# Patient Record
Sex: Female | Born: 1935 | Race: White | Hispanic: No | Marital: Married | Smoking: Former smoker
Health system: Southern US, Community
[De-identification: ages and names within clinical notes are randomized; demographics above are authoritative.]

## PROBLEM LIST (undated history)

## (undated) DIAGNOSIS — H409 Unspecified glaucoma: Secondary | ICD-10-CM

## (undated) DIAGNOSIS — R0600 Dyspnea, unspecified: Secondary | ICD-10-CM

## (undated) DIAGNOSIS — Z7901 Long term (current) use of anticoagulants: Secondary | ICD-10-CM

## (undated) DIAGNOSIS — M353 Polymyalgia rheumatica: Secondary | ICD-10-CM

## (undated) DIAGNOSIS — R609 Edema, unspecified: Secondary | ICD-10-CM

## (undated) DIAGNOSIS — J189 Pneumonia, unspecified organism: Secondary | ICD-10-CM

## (undated) DIAGNOSIS — N183 Chronic kidney disease, stage 3 unspecified: Secondary | ICD-10-CM

## (undated) DIAGNOSIS — I779 Disorder of arteries and arterioles, unspecified: Secondary | ICD-10-CM

## (undated) DIAGNOSIS — R21 Rash and other nonspecific skin eruption: Secondary | ICD-10-CM

## (undated) DIAGNOSIS — G8929 Other chronic pain: Secondary | ICD-10-CM

## (undated) DIAGNOSIS — J449 Chronic obstructive pulmonary disease, unspecified: Secondary | ICD-10-CM

## (undated) DIAGNOSIS — R269 Unspecified abnormalities of gait and mobility: Secondary | ICD-10-CM

## (undated) DIAGNOSIS — E46 Unspecified protein-calorie malnutrition: Secondary | ICD-10-CM

## (undated) DIAGNOSIS — E7800 Pure hypercholesterolemia, unspecified: Secondary | ICD-10-CM

## (undated) DIAGNOSIS — I509 Heart failure, unspecified: Secondary | ICD-10-CM

## (undated) DIAGNOSIS — M549 Dorsalgia, unspecified: Secondary | ICD-10-CM

## (undated) DIAGNOSIS — K921 Melena: Secondary | ICD-10-CM

## (undated) DIAGNOSIS — E042 Nontoxic multinodular goiter: Secondary | ICD-10-CM

## (undated) DIAGNOSIS — K5790 Diverticulosis of intestine, part unspecified, without perforation or abscess without bleeding: Secondary | ICD-10-CM

## (undated) DIAGNOSIS — R531 Weakness: Secondary | ICD-10-CM

## (undated) DIAGNOSIS — I4892 Unspecified atrial flutter: Secondary | ICD-10-CM

## (undated) DIAGNOSIS — I499 Cardiac arrhythmia, unspecified: Secondary | ICD-10-CM

## (undated) HISTORY — PX: OTHER SURGICAL HISTORY: SHX169

## (undated) HISTORY — DX: Dorsalgia, unspecified: M54.9

## (undated) HISTORY — DX: Edema, unspecified: R60.9

## (undated) HISTORY — DX: Disorder of arteries and arterioles, unspecified: I77.9

## (undated) HISTORY — DX: Rash and other nonspecific skin eruption: R21

## (undated) HISTORY — PX: LAMINECTOMY: SHX219

## (undated) HISTORY — DX: Pneumonia, unspecified organism: J18.9

## (undated) HISTORY — DX: Diverticulosis of intestine, part unspecified, without perforation or abscess without bleeding: K57.90

## (undated) HISTORY — DX: Unspecified glaucoma: H40.9

## (undated) HISTORY — DX: Long term (current) use of anticoagulants: Z79.01

## (undated) HISTORY — PX: SHOULDER SURGERY: SHX246

## (undated) HISTORY — DX: Heart failure, unspecified: I50.9

## (undated) HISTORY — DX: Weakness: R53.1

## (undated) HISTORY — PX: GALLBLADDER SURGERY: SHX652

## (undated) HISTORY — DX: Nontoxic multinodular goiter: E04.2

## (undated) HISTORY — DX: Unspecified abnormalities of gait and mobility: R26.9

## (undated) HISTORY — DX: Chronic kidney disease, stage 3 (moderate): N18.3

## (undated) HISTORY — DX: Other chronic pain: G89.29

## (undated) HISTORY — DX: Chronic kidney disease, stage 3 unspecified: N18.30

## (undated) HISTORY — DX: Unspecified protein-calorie malnutrition: E46

## (undated) HISTORY — DX: Polymyalgia rheumatica: M35.3

---

## 1999-04-18 ENCOUNTER — Encounter: Admission: RE | Admit: 1999-04-18 | Discharge: 1999-04-18 | Payer: Self-pay | Admitting: Internal Medicine

## 1999-04-18 ENCOUNTER — Encounter: Payer: Self-pay | Admitting: Internal Medicine

## 1999-07-03 ENCOUNTER — Other Ambulatory Visit: Admission: RE | Admit: 1999-07-03 | Discharge: 1999-07-03 | Payer: Self-pay | Admitting: Orthopedic Surgery

## 2001-02-03 ENCOUNTER — Encounter: Admission: RE | Admit: 2001-02-03 | Discharge: 2001-02-03 | Payer: Self-pay | Admitting: Internal Medicine

## 2001-02-03 ENCOUNTER — Encounter: Payer: Self-pay | Admitting: Internal Medicine

## 2001-05-11 ENCOUNTER — Ambulatory Visit (HOSPITAL_COMMUNITY): Admission: RE | Admit: 2001-05-11 | Discharge: 2001-05-11 | Payer: Self-pay | Admitting: Gastroenterology

## 2001-07-15 ENCOUNTER — Encounter: Payer: Self-pay | Admitting: Internal Medicine

## 2001-07-15 ENCOUNTER — Encounter: Admission: RE | Admit: 2001-07-15 | Discharge: 2001-07-15 | Payer: Self-pay | Admitting: Internal Medicine

## 2002-03-15 ENCOUNTER — Encounter: Admission: RE | Admit: 2002-03-15 | Discharge: 2002-03-15 | Payer: Self-pay | Admitting: Internal Medicine

## 2002-03-15 ENCOUNTER — Encounter: Payer: Self-pay | Admitting: Internal Medicine

## 2005-02-03 ENCOUNTER — Ambulatory Visit (HOSPITAL_COMMUNITY): Admission: RE | Admit: 2005-02-03 | Discharge: 2005-02-03 | Payer: Self-pay | Admitting: Internal Medicine

## 2005-09-22 ENCOUNTER — Encounter: Admission: RE | Admit: 2005-09-22 | Discharge: 2005-09-22 | Payer: Self-pay | Admitting: Gastroenterology

## 2005-10-07 ENCOUNTER — Encounter: Admission: RE | Admit: 2005-10-07 | Discharge: 2005-10-07 | Payer: Self-pay | Admitting: Gastroenterology

## 2005-10-14 ENCOUNTER — Ambulatory Visit (HOSPITAL_COMMUNITY): Admission: RE | Admit: 2005-10-14 | Discharge: 2005-10-14 | Payer: Self-pay | Admitting: Gastroenterology

## 2005-10-22 ENCOUNTER — Ambulatory Visit (HOSPITAL_COMMUNITY): Admission: RE | Admit: 2005-10-22 | Discharge: 2005-10-23 | Payer: Self-pay | Admitting: General Surgery

## 2005-10-22 ENCOUNTER — Encounter (INDEPENDENT_AMBULATORY_CARE_PROVIDER_SITE_OTHER): Payer: Self-pay | Admitting: Specialist

## 2005-10-25 ENCOUNTER — Inpatient Hospital Stay (HOSPITAL_COMMUNITY): Admission: EM | Admit: 2005-10-25 | Discharge: 2005-10-29 | Payer: Self-pay | Admitting: Emergency Medicine

## 2005-10-25 ENCOUNTER — Encounter: Payer: Self-pay | Admitting: Gastroenterology

## 2005-12-04 ENCOUNTER — Ambulatory Visit (HOSPITAL_COMMUNITY): Admission: RE | Admit: 2005-12-04 | Discharge: 2005-12-04 | Payer: Self-pay | Admitting: Gastroenterology

## 2005-12-04 ENCOUNTER — Encounter: Payer: Self-pay | Admitting: Gastroenterology

## 2007-03-17 ENCOUNTER — Ambulatory Visit: Payer: Self-pay | Admitting: Gastroenterology

## 2007-03-17 LAB — CONVERTED CEMR LAB
ALT: 11 units/L (ref 0–35)
Albumin: 3.9 g/dL (ref 3.5–5.2)
Alkaline Phosphatase: 43 units/L (ref 39–117)
Amylase: 213 units/L — ABNORMAL HIGH (ref 27–131)

## 2007-03-29 ENCOUNTER — Ambulatory Visit (HOSPITAL_COMMUNITY): Admission: RE | Admit: 2007-03-29 | Discharge: 2007-03-29 | Payer: Self-pay | Admitting: Gastroenterology

## 2007-03-29 ENCOUNTER — Encounter: Payer: Self-pay | Admitting: Gastroenterology

## 2007-03-29 DIAGNOSIS — K209 Esophagitis, unspecified without bleeding: Secondary | ICD-10-CM | POA: Insufficient documentation

## 2007-04-05 ENCOUNTER — Ambulatory Visit: Payer: Self-pay | Admitting: Gastroenterology

## 2007-04-06 DIAGNOSIS — M129 Arthropathy, unspecified: Secondary | ICD-10-CM

## 2007-04-06 DIAGNOSIS — R1011 Right upper quadrant pain: Secondary | ICD-10-CM

## 2007-04-06 DIAGNOSIS — R1013 Epigastric pain: Secondary | ICD-10-CM

## 2007-09-24 ENCOUNTER — Ambulatory Visit (HOSPITAL_BASED_OUTPATIENT_CLINIC_OR_DEPARTMENT_OTHER): Admission: RE | Admit: 2007-09-24 | Discharge: 2007-09-24 | Payer: Self-pay | Admitting: Orthopedic Surgery

## 2007-11-18 ENCOUNTER — Ambulatory Visit: Payer: Self-pay | Admitting: Gastroenterology

## 2007-11-18 LAB — CONVERTED CEMR LAB
ALT: 15 units/L (ref 0–35)
AST: 18 units/L (ref 0–37)
Alkaline Phosphatase: 50 units/L (ref 39–117)
Basophils Absolute: 0.1 10*3/uL (ref 0.0–0.1)
Basophils Relative: 1 % (ref 0.0–3.0)
Eosinophils Absolute: 0.2 10*3/uL (ref 0.0–0.7)
Eosinophils Relative: 2.1 % (ref 0.0–5.0)
HCT: 35 % — ABNORMAL LOW (ref 36.0–46.0)
Lipase: 40 units/L (ref 11.0–59.0)
Lymphocytes Relative: 20 % (ref 12.0–46.0)
MCHC: 34.9 g/dL (ref 30.0–36.0)
Neutro Abs: 5.2 10*3/uL (ref 1.4–7.7)
Neutrophils Relative %: 68.7 % (ref 43.0–77.0)
RDW: 13 % (ref 11.5–14.6)
Total Bilirubin: 0.8 mg/dL (ref 0.3–1.2)
Total Protein: 6.5 g/dL (ref 6.0–8.3)
WBC: 7.6 10*3/uL (ref 4.5–10.5)

## 2007-11-19 ENCOUNTER — Ambulatory Visit: Payer: Self-pay | Admitting: Gastroenterology

## 2007-11-19 LAB — CONVERTED CEMR LAB
Amylase: 183 units/L — ABNORMAL HIGH (ref 27–131)
Lipase: 40 units/L (ref 11.0–59.0)

## 2008-01-11 ENCOUNTER — Encounter: Admission: RE | Admit: 2008-01-11 | Discharge: 2008-01-11 | Payer: Self-pay | Admitting: Internal Medicine

## 2009-02-19 ENCOUNTER — Encounter: Admission: RE | Admit: 2009-02-19 | Discharge: 2009-02-19 | Payer: Self-pay | Admitting: Internal Medicine

## 2010-04-10 ENCOUNTER — Ambulatory Visit (HOSPITAL_COMMUNITY)
Admission: RE | Admit: 2010-04-10 | Discharge: 2010-04-10 | Payer: Medicare Other | Source: Home / Self Care | Attending: Internal Medicine | Admitting: Internal Medicine

## 2010-04-10 DIAGNOSIS — I739 Peripheral vascular disease, unspecified: Secondary | ICD-10-CM

## 2010-04-28 ENCOUNTER — Encounter: Payer: Self-pay | Admitting: Internal Medicine

## 2010-05-03 ENCOUNTER — Ambulatory Visit (HOSPITAL_COMMUNITY)
Admission: RE | Admit: 2010-05-03 | Discharge: 2010-05-03 | Payer: Self-pay | Source: Home / Self Care | Attending: Internal Medicine | Admitting: Internal Medicine

## 2010-05-07 ENCOUNTER — Other Ambulatory Visit: Payer: Self-pay | Admitting: Internal Medicine

## 2010-05-07 DIAGNOSIS — I70201 Unspecified atherosclerosis of native arteries of extremities, right leg: Secondary | ICD-10-CM

## 2010-05-08 ENCOUNTER — Other Ambulatory Visit (HOSPITAL_COMMUNITY): Payer: Self-pay | Admitting: Interventional Radiology

## 2010-05-08 ENCOUNTER — Encounter: Payer: Self-pay | Admitting: Internal Medicine

## 2010-05-08 ENCOUNTER — Ambulatory Visit
Admission: RE | Admit: 2010-05-08 | Discharge: 2010-05-08 | Disposition: A | Discharge status: Home / Self Care | Payer: BLUE CROSS/BLUE SHIELD | Source: Ambulatory Visit | Attending: Internal Medicine | Admitting: Internal Medicine

## 2010-05-08 DIAGNOSIS — I70201 Unspecified atherosclerosis of native arteries of extremities, right leg: Secondary | ICD-10-CM

## 2010-05-08 DIAGNOSIS — R52 Pain, unspecified: Secondary | ICD-10-CM

## 2010-05-21 ENCOUNTER — Other Ambulatory Visit: Payer: Self-pay

## 2010-05-24 ENCOUNTER — Other Ambulatory Visit (HOSPITAL_COMMUNITY): Payer: Self-pay | Admitting: Interventional Radiology

## 2010-05-24 ENCOUNTER — Ambulatory Visit (HOSPITAL_COMMUNITY)
Admission: RE | Admit: 2010-05-24 | Discharge: 2010-05-24 | Disposition: A | Discharge status: Home / Self Care | Payer: Medicare Other | Source: Ambulatory Visit | Attending: Interventional Radiology | Admitting: Interventional Radiology

## 2010-05-24 DIAGNOSIS — L719 Rosacea, unspecified: Secondary | ICD-10-CM | POA: Insufficient documentation

## 2010-05-24 DIAGNOSIS — Z01812 Encounter for preprocedural laboratory examination: Secondary | ICD-10-CM | POA: Insufficient documentation

## 2010-05-24 DIAGNOSIS — I70219 Atherosclerosis of native arteries of extremities with intermittent claudication, unspecified extremity: Secondary | ICD-10-CM | POA: Insufficient documentation

## 2010-05-24 DIAGNOSIS — R52 Pain, unspecified: Secondary | ICD-10-CM

## 2010-05-24 DIAGNOSIS — M353 Polymyalgia rheumatica: Secondary | ICD-10-CM | POA: Insufficient documentation

## 2010-05-24 DIAGNOSIS — Z7982 Long term (current) use of aspirin: Secondary | ICD-10-CM | POA: Insufficient documentation

## 2010-05-24 LAB — CBC
MCH: 31.4 pg (ref 26.0–34.0)
MCHC: 33.4 g/dL (ref 30.0–36.0)
MCV: 93.8 fL (ref 78.0–100.0)
RBC: 4.37 MIL/uL (ref 3.87–5.11)
WBC: 12.1 10*3/uL — ABNORMAL HIGH (ref 4.0–10.5)

## 2010-05-24 LAB — POCT I-STAT, CHEM 8
Chloride: 107 mEq/L (ref 96–112)
Creatinine, Ser: 1.6 mg/dL — ABNORMAL HIGH (ref 0.4–1.2)
Glucose, Bld: 80 mg/dL (ref 70–99)
HCT: 43 % (ref 36.0–46.0)
Potassium: 4 mEq/L (ref 3.5–5.1)

## 2010-05-24 LAB — PROTIME-INR: INR: 0.85 (ref 0.00–1.49)

## 2010-05-28 ENCOUNTER — Other Ambulatory Visit: Payer: Self-pay | Admitting: Internal Medicine

## 2010-05-28 ENCOUNTER — Other Ambulatory Visit: Payer: Self-pay | Admitting: Interventional Radiology

## 2010-05-28 DIAGNOSIS — I70209 Unspecified atherosclerosis of native arteries of extremities, unspecified extremity: Secondary | ICD-10-CM

## 2010-07-02 ENCOUNTER — Other Ambulatory Visit: Payer: Self-pay | Admitting: Internal Medicine

## 2010-07-02 ENCOUNTER — Ambulatory Visit
Admission: RE | Admit: 2010-07-02 | Discharge: 2010-07-02 | Disposition: A | Discharge status: Home / Self Care | Payer: Medicare Other | Source: Ambulatory Visit | Attending: Internal Medicine | Admitting: Internal Medicine

## 2010-07-02 ENCOUNTER — Ambulatory Visit
Admission: RE | Admit: 2010-07-02 | Discharge: 2010-07-02 | Disposition: A | Discharge status: Home / Self Care | Payer: BLUE CROSS/BLUE SHIELD | Source: Ambulatory Visit | Attending: Interventional Radiology | Admitting: Interventional Radiology

## 2010-07-02 DIAGNOSIS — I70209 Unspecified atherosclerosis of native arteries of extremities, unspecified extremity: Secondary | ICD-10-CM

## 2010-08-20 NOTE — Letter (Signed)
March 17, 2007    Erskine Speed, M.D.  [address], [address]  [place], [place] [address]   RE:  KIONI, STAHL  MRN:  [id number]  /  DOB:  1928/02/27   Dear Dr. Ch[place] Si:   Upon your kind referral, I had the pleasure of evaluating your patient  and I am pleased to offer my findings.  I saw Holley Kocurek in the office  today.  Enclosed is a copy of my progress note that details my findings  and recommendations.   Thank you for the opportunity to participate in your patient's care.    Sincerely,      Barbette Hair. Arlyce Dice, MD,FACG  Electronically Signed    RDK/MedQ  DD: 03/17/2007  DT: 03/17/2007  Job #: [id number]

## 2010-08-20 NOTE — Assessment & Plan Note (Signed)
[facility]                         GASTROENTEROLOGY OFFICE NOTE   KENNIS, BUELL                       MRN:          [id number]  DATE:03/17/2007                            DOB:          Jul 20, 1927    REASON FOR CONSULTATION:  Abdominal pain.   Ms. Femia is a pleasant 77 year old white female, mother-in-law of Dr.  Wadie Lessen, complaining of abdominal pain.  Over the last six months,  she has been suffering from severe intermittent right upper quadrant and  midepigastric pain.  The pain is without radiation.  It may last up to  30 minutes at a time, and it is described as severe pain.  It is  unrelated to meals or exertion.  She had similar pain prior to her  cholecystectomy in 2007.  No gallstones were seen.  She had a bile duct  leak postoperatively that was treated with a biliary stent.  She does  take Aleve twice daily for chronic low back pain.  She is also taking  doxycycline three times a day for Rosacea.  She denies pyrosis, melena,  or hematochezia.  She has episodes of pain every few weeks, the last  approximately two weeks ago.  Liver tests on February 18, 2007 were  entirely unremarkable.  She apparently did have elevations of her  amylase in the past prior to her cholecystectomy.   PAST MEDICAL HISTORY:  Pertinent for a laminectomy and cholecystectomy.  She has degenerative arthritis.   FAMILY HISTORY:  Noncontributory.   MEDICATIONS:  1. Aleve twice daily.  2. Doxycycline 100 mg 3 times a day.  3. Amitriptyline 10 mg nightly.  4. Boniva.   She is allergic to CODEINE.   She does not smoke.  She drinks rarely.  She is married and retired.   REVIEW OF SYSTEMS:  Pertinent for low back pain, which is chronic.   PHYSICAL EXAMINATION:  She is a healthy-appearing female who has  questionable faint scleral icterus and sublingual icterus.  Pulse 58, blood pressure 98/60.  Weight 113.  HEENT: EOMI.  PERRLA.  Sclerae are  anicteric.  Conjunctivae are pink.  NECK:  Supple without thyromegaly, adenopathy or carotid bruits.  CHEST:  Clear to auscultation and percussion without adventitious  sounds.  CARDIAC:  Regular rhythm; normal S1 S2.  There are no murmurs, gallops  or rubs.  ABDOMEN:  She has very mild tenderness to palpation in the upper and mid  epigastrium and right upper quadrant.  There are no abdominal masses or  organomegaly.  EXTREMITIES:  Full range of motion.  No cyanosis, clubbing or edema.  RECTAL:  Deferred.   IMPRESSION:  Intermittent severe upper abdominal pain, reminiscent of  pain prior to cholecystectomy.  Pain could represent ampullary spasm,  stenosis, or possibly retained bile duct stone.  With her chronic  nonsteroidal use, I am concerned that the pain is related to this.  Active pepcid disease related to nonsteroidals is also a consideration.  If she in fact has hyperbilirubinemia, this would likely point to a  biliary source for her pain.   RECOMMENDATION:  1. Hold  Aleve.  At the patient's request, I will place her on Celebrex      20 mg daily along with Nexium 40 mg daily.  2. Upper endoscopy.  3. Repeat LFTs.  4. If the above are unrevealing, then I will attempt to get liver      tests and pancreatic enzymes acutely during an episode of pain.  5. Repeat abdominal ultrasound if the above tests are not diagnostic.     Barbette Hair. Arlyce Dice, MD,FACG  Electronically Signed    RDK/MedQ  DD: 03/17/2007  DT: 03/17/2007  Job #: [id number]   cc:   Erskine Speed, M.D.

## 2010-08-20 NOTE — Op Note (Signed)
NAMEROTUNDA, WORDEN                ACCOUNT NO.:  [id number]   MEDICAL RECORD NO.:  [id number]          PATIENT TYPE:  AMB   LOCATION:  DSC                          FACILITY:  [facility]   PHYSICIAN:  Katy Fitch. Sypher, M.D. DATE OF BIRTH:  12/28/1927   DATE OF PROCEDURE:  09/24/2007  DATE OF DISCHARGE:                               OPERATIVE REPORT   PREOPERATIVE DIAGNOSES:  1. Chronic right ring finger stenosing tenosynovitis.  2. Chronic right ring finger proximal interphalangeal flexion      contracture to 30 degrees.  3. Chronic left ring finger stenosing tenosynovitis at A1 pulley.   POSTOPERATIVE DIAGNOSES:  1. Chronic right ring finger stenosing tenosynovitis.  2. Chronic right ring finger proximal interphalangeal flexion      contracture to 30 degrees.  3. Chronic left ring finger stenosing tenosynovitis at A1 pulley.  4. Identification of fibrillar degenerative rupture of superficialis      tendons of ring fingers bilaterally.   OPERATIONS:  1. Release of right ring finger A1 pulley with tenosynovectomy and      debridement of necrotic tendon from superficialis tendon slips.  2. Injection of Depo-Medrol 40 mg/mL and 2% lidocaine into the      proximal interphalangeal  joint capsule and collateral ligaments in      an effort to relieve flexion contracture.  3. Release of left ring finger A1 pulley with debridement of fibrillar      degenerative tendon.   OPERATING SURGEON:  Katy Fitch. Sypher, MD   ASSISTANT:  Marveen Reeks. Dasnoit, PA-C.   ANESTHESIA:  General sedation and 2% lidocaine metacarpal head-level  block of right ring finger and flexor sheath and left ring finger and  flexor sheath.   SUPERVISING ANESTHESIOLOGIST:  Quita Skye. Krista Blue, MD   INDICATIONS:  Satara Virella is a 77 year old woman who is referred  through the courtesy of her husband Dr. Laney Potash and her primary  care physician, Dr. Nila Nephew for evaluation and management of  bilateral chronic trigger  fingers.   She has had prior injection on the left by Dr. [place] Si and injection by  myself on the right in the remote past.   She has had chronic stenosing tenosynovitis to the point of developing a  significant flexion contracture of her right ring finger that was  continuing to lock.   We recommended proceeding with exploration and debridement of her  tendons at this time as well as release of the A1 pulleys.   PROCEDURE:  Samara Stankowski was brought to the operating room and placed in  supine position upon the operating table.   Following an anesthesia consult by Dr. Krista Blue, a general sedation and  local anesthesia strategy was advised and accepted by Ms. Marca Ancona.   We brought her to room #1 of the [facility], placed in  supine position upon the operating table and under Dr. Robina Ade strict  supervision sedation provided.  The right and left arms were prepped  with Betadine soap solution and sterilely draped.  A pneumatic  tourniquet was applied to the proximal forearm on the  right and to the  proximal brachium on the left.   Following exsanguination of the right hand by direct compression, the  arterial tourniquet was inflated to 250 mmHg.  A 2% lidocaine was  infiltrated in the path of the tendon incision.  When anesthesia  satisfactory, we performed a short oblique incision directly over the A1  pulley.  There was marked edema noted.  The pulley was identified.  The  neurovascular structures were retracted and the pulley incised with  scalpel and scissors.  Immediately, we identified a 30-40% superficialis  tendon degenerative tear with fragmentation and fibrillation of the  tendon substance.   The tendons were delivered with blunt retractors and a careful tendon  debridement accomplished with a micro-rongeur and scissors.  After the  tendons were smoothed to a clean margin, full range of motion and  flexion was noted.  The flexion contracture remained 25 degrees  after  release of the A1 pulley and debridement of the tendons.   The profundus tendon was delivered and found to be normal.   The PIP was then infiltrated through a dorsal approach directly into the  joint capsule with approximately 0.5 mL of a 50/50 mixture of Depo-  Medrol 40 mg/mL and 2% lidocaine without epinephrine.  A small amount  was placed deep to both collateral ligaments.  This strategy was  employed to try to help soften the fibrosis of the capsule and allow  recovery of full motion at the PIP joint.   This will take many weeks.   The wound was then repaired with mattress suture of 5-0 nylon.  A  compressive dressing was applied with Xeroflo sterile gauze and an Ace  wrap.   The tourniquet was released with immediate capillary refill of all  fingers.   Attention was then directed to the left hand.  The left hand was  exsanguinated with an Esmarch bandage.  An arterial tourniquet on  proximal brachium inflated to 250 mmHg.  A short oblique incision was  fashioned directly over the left ring finger A1 pulley.  Edema was  noted.  The pulley was split with scalpel and scissors and the tendons  were delivered.  Once again, a 30% rupture of the superficialis tendon  was noted.  This was debrided to a smooth margin with a micro-rongeur  and scissors.   Thereafter, full range of motion of the ring finger interphalangeal  joints was recovered.   The wound was then repaired with mattress suture of 5-0 nylon followed  by dressing with Xeroflo sterile gauze and Ace wrap.   For aftercare, Ms. Paczkowski was advised to remove her dressings after  approximately 3 days and use Band-Aid dressings.  Her husband would  monitor the wounds.  We anticipated suture removal in 7-10 days postop.      Katy Fitch Sypher, M.D.  Electronically Signed     RVS/MEDQ  D:  09/24/2007  T:  09/24/2007  Job:  [id number]   cc:   Erskine Speed, M.D.

## 2010-08-20 NOTE — Letter (Signed)
March 17, 2007    Luanna Salk   RE:  JESUSA, STENERSON  MRN:  [id number]  /  DOB:  07-21-1927   Dear Alvis Lemmings:   It is my pleasure to have treated you recently as a new patient in my  office.  I appreciate your confidence and the opportunity to participate  in your care.   Since I do have a busy inpatient endoscopy schedule and office schedule,  my office hours vary weekly.  I am, however, available for emergency  calls every day through my office.  If I cannot promptly meet an urgent  office appointment, another one of our gastroenterologists will be able  to assist you.   My well-trained staff are prepared to help you at all times.  For  emergencies after office hours, a physician from our gastroenterology  section is always available through my 24-hour answering service.   While you are under my care, I encourage discussion of your questions  and concerns, and I will be happy to return your calls as soon as I am  available.   Once again, I welcome you as a new patient and I look forward to a happy  and healthy relationship.    Sincerely,      Barbette Hair. Arlyce Dice, MD,FACG  Electronically Signed   RDK/MedQ  DD: 03/17/2007  DT: 03/17/2007  Job #: [id number]

## 2010-08-23 NOTE — Op Note (Signed)
[name], [name]                ACCOUNT NO.:  [id number]   MEDICAL RECORD NO.:  [id number]          PATIENT TYPE:  OIB   LOCATION:  0098                         [facility]ACILITY[facility]  [facility]   PHYSICIAN:  Gita Kudo, M.D. DATE OF BIRTH:  04/04/1928   DATE OF PROCEDURE:  10/22/2005  DATE OF DISCHARGE:                                 OPERATIVE REPORT   OPERATIVE PROCEDURE:  Laparoscopic cholecystectomy with intraoperative  cholangiogram.   SURGEON:  Gita Kudo, M.D.   ASSISTANT:  Anselm Pancoast. Zachery Dakins, M.D.   ANESTHESIA:  General endotracheal.   PREOPERATIVE DIAGNOSES:  1.  Cholecystitis.  2.  Biliary sludge.  3.  Probably passed common duct stone.   POSTOP DIAGNOSIS:  1.  Cholecystitis.  2.  Biliary sludge.  3.  Probably passed common duct stone.  4.  Grossly normal on intraoperative cholangiogram.   CLINICAL SUMMARY:  A 77 year old female in excellent general health.  She  had severe bouts of right upper quadrant abdominal pain.  No real fatty food  intolerance, history of gallbladder disease.  Liver function studies were  normal, but her amylase and lipase were elevated.  Ultrasound was abnormal  with slush.  The pancreatic functions came down, but never quite to normal.  Repeated this morning; and not available.   OPERATIVE FINDINGS:  The gallbladder was thin-walled and not acutely  inflamed.  The cystic duct was normal as was the artery in both size and  anatomy.  The common duct was a little dilated on the cholangiogram; and  there was questionable defect, that was tiny at the distal end.  However,  there was no obstruction; and the dye went readily into the duodenum.   OPERATIVE PROCEDURE:  Under satisfactory general endotracheal anesthesia,  having received 1 gram Ancef preop, the patient was positioned, prepped, and  draped in standard fashion.  A total of 25 mL of 0.5% Marcaine with  epinephrine was infiltrated at the skin incision sites for postop analgesia.  Midline incision made below the umbilicus, carried into the peritoneum  through the midline.  Controlled with a figure-of-eight #0 Vicryl suture and  operating Hassan port inserted, secured.  Good CO2 pneumoperitoneum  established and camera placed.  With good visualization two #5 ports were  placed laterally, and a second #10 port medially.  Operating through the  medial port, we carefully dissected the cystic duct and artery.  When the  duct was circumferentially dissected and securely identified, it was  controlled with a single clip near the gallbladder and an incision made.  A  percutaneous catheter placed and a good cholangiogram obtained with the  findings mentioned above.  Catheter withdrawn and the duct controlled with  multiple clips distally and divided.  Cystic artery circumferentially  dissected and controlled with multiple clips and divided.  The gallbladder  then removed from below upward with cautery for hemostasis and dissection.  The liver bed looked good, was lavaged with saline, cauterized, and then  suctioned dry.   Camera moved to the upper port and a large grasper placed through the  umbilical port; and  used to extract the gallbladder intact, without spillage  or problem.  Operative site again checked, lavaged and suctioned.  CO2 and  ports released.  Midline closed with a previous figure-of-eight and a second  #0 Vicryl suture.  The medial upper incision, fascia approximated with 1-0  Vicryl suture.  Then the skin incisions closed with 4-0 Vicryl and Steri-  Strips.  Sterile absorbent dressings were applied;  and the patient went to  the recovery room from the operating room in good condition.  There were no  complications.  It is anticipated that we will recheck her liver function  studies in the future; and considered ERCP only if she becomes symptomatic.           ______________________________  Gita Kudo, M.D.     MRL/MEDQ  D:  10/22/2005  T:   10/22/2005  Job:  [id number]   cc:   Erskine Speed, M.D.  Fax: [phone]   Danise Edge, M.D.   Bernette Redbird, M.D.  Fax: [phone]   Llana Aliment. Malon Kindle., M.D.  Fax: [phone]

## 2010-08-23 NOTE — Discharge Summary (Signed)
[name], [name]                ACCOUNT NO.:  [id number]   MEDICAL RECORD NO.:  [id number]          PATIENT TYPE:  INP   LOCATION:  1621                         FACILITY:  [facility]   PHYSICIAN:  Gita Kudo, M.D. DATE OF BIRTH:  09-Nov-1927   DATE OF ADMISSION:  10/25/2005  DATE OF DISCHARGE:  10/29/2005                                 DISCHARGE SUMMARY   CHIEF COMPLAINT:  Abdominal pain.   HISTORY OF PRESENT ILLNESS:  This 78 year old woman was readmitted through  the emergency room because of abdominal pain. Approximately three days  before admission, she underwent an uneventful laparoscopic cholecystectomy  with normal appearing cholangiogram. On the day of admission, she presented  to the emergency room with severe abdominal pain primarily in her right  upper quadrant.   LABORATORY DATA:  CMET initially showed normal electrolytes and slightly  elevated creatinine of 1.25. Her liver function studies were slightly  abnormal with elevated AST, ALT. Her total bilirubin and lipase and amylase  were normal. She had repeat studies during her hospitalization, and the  final set on October 09, 2005 showed all to be within normal limits.   RADIOLOGIC STUDIES:  Showed fluid. CAT scan and HIDA scan showed a probable  small leak. There was no evidence of any pus. HIDA scan confirmed a small  leak. ERCP was performed with a placement of a stent.   HOSPITAL COURSE:  The patient was admitted to the hospital and seen in  consultation by Dr. Ewing Schlein. He proceeded to ERCP with sphincterotomy and  stent placement on October 25, 2005. The patient did well clinically except she  was having a lot of pain and required several days of analgesics in the  hospital. Gradually, this improved, and on July 25, she felt much better.  She remained afebrile throughout with a soft abdomen. She had good bowel  function. Accordingly, she was discharged on July 25 to be followed as an  outpatient by myself and Dr.  Ewing Schlein.   DISCHARGE DIAGNOSES:  Post laparoscopic cholecystectomy bile leak.   OPERATION:  Endoscopic retrograde cholangiopancreatography with  sphincterotomy and stent placement by Dr. Ewing Schlein.   COMPLICATIONS, INFECTIONS:  Bile leak.   CONDITION ON DISCHARGE:  Good.           ______________________________  Gita Kudo, M.D.     MRL/MEDQ  D:  10/29/2005  T:  10/29/2005  Job:  [id number]   cc:   Erskine Speed, M.D.  Fax: [phone]   Petra Kuba, M.D.  Fax: [phone]

## 2010-08-23 NOTE — Consult Note (Signed)
[name], [name]                ACCOUNT NO.:  [id number]   MEDICAL RECORD NO.:  [id number]          PATIENT TYPE:  INP   LOCATION:  1621                         FACILITY:  [facility]   PHYSICIAN:  Petra Kuba, M.D.    DATE OF BIRTH:  [birth year]   DATE OF CONSULTATION:  10/25/2005  DATE OF DISCHARGE:                                   CONSULTATION   HISTORY:  The patient with recent surgery by Dr. Maryagnes Amos and negative  intraoperative cholangiogram, who had been doing well for a few days at home  but having increased pain at 3 a.m. last night.  He came to the emergency  room, and workup led to a diagnosis of a bile leak based on a HIDA scan, and  I was consulted for consideration of ERCP and stent.  She did have an  endoscopy about two months ago by Dr. Randa Evens, which was nondiagnostic and  well tolerated.   PAST MEDICAL HISTORY:  1.  Some reflux.  2.  Rosacea.  3.  Arthritis.  4.  Recent cholecystectomy.  5.  Rotator cuff repair.  6.  History of Morton's neuroma.   ALLERGIES:  CODEINE.   CURRENT MEDICATIONS:  Darvocet, doxycycline, Prilosec, and Aleve p.r.n.   SOCIAL HISTORY:  Does not smoke, minimally drinks.   FAMILY HISTORY:  Noncontributory.   REVIEW OF SYSTEMS:  Negative except above.   PHYSICAL EXAMINATION:  VITAL SIGNS:  See chart.  Afebrile.  GENERAL:  No acute distress lying comfortably in the bed.  ABDOMEN:  She is moderately tender in the upper abdomen, nontender in the  lower, minimal guarding, no rebound.   White count 10.1, hemoglobin 12.8, normal BUN and creatinine, normal liver  tests except for a mild ALT of 121, AST 113, normal lipase.  The CT scan did  show a little bit of perihepatic fluid with a questionable bile leak, and  the HIDA scan confirmed the bile leak.   ASSESSMENT:  Bile leak.   PLAN:  The risks, benefits, and methods of ERCP, possible sphincterotomy,  and a stent placed were discussed.  Some of the options of this were  unsuccessful were  discussed with both the patient and her husband, and we  will proceed ASAP today with further workup and plans pending those  findings.           ______________________________  Petra Kuba, M.D.     MEM/MEDQ  D:  10/25/2005  T:  10/25/2005  Job:  [id number]   cc:   Fayrene Fearing L. Malon Kindle., M.D.  Fax: [phone]

## 2010-08-23 NOTE — Op Note (Signed)
[name], [name]                ACCOUNT NO.:  [id number]   MEDICAL RECORD NO.:  [id number]          PATIENT TYPE:  AMB   LOCATION:  ENDO                         FACILITY:  [facility]   PHYSICIAN:  James L. Malon Kindle., M.D.DATE OF BIRTH:  02/28/1928   DATE OF PROCEDURE:  12/04/2005  DATE OF DISCHARGE:                                 OPERATIVE REPORT   SURGEON:  Fayrene Fearing L. Randa Evens, M.D.   PROCEDURE:  Esophagogastroduodenoscopy with removal of biliary stent.   MEDICATIONS:  Cetacaine spray, fentanyl 50 mcg, Versed 5 mg IV.   INDICATIONS:  A 77 year old, who had laparoscopic cholecystectomy on June  18, and on June 21, she had to have a biliary stent placed due to a bile  leak following her laparoscopic cholecystectomy.  She has done well since  then.  The stent has now been in approximately six weeks and this is to be  removed electively.   DESCRIPTION OF PROCEDURE:  The procedure had been explained to the patient  and consent obtained.  Left lateral decubitus position.  The scope was  inserted and advanced.  The stomach was entered.  The pylorus was identified  and passed.  Upon entering the second duodenum, the stent was seen.  It was  nearly out, colliding into the wall of the duodenum on the opposite side.  After a few maneuvers, the snare was placed around the tip of the stent and  the stent was pulled out into the stomach, and then pulled up against the  scope and pulled out the esophagus with no evidence of problem.  The scope  was withdrawn with the stent and the patient tolerated the procedure well.   ASSESSMENT:  Successful removal of biliary stent.   PLAN:  Will resume all of her medicines and see her back on an as-needed  basis.           ______________________________  Llana Aliment Malon Kindle., M.D.     Waldron Session  D:  12/04/2005  T:  12/04/2005  Job:  [id number]   cc:   Erskine Speed, M.D.  Gita Kudo, M.D.  Petra Kuba, M.D.

## 2010-08-23 NOTE — Procedures (Signed)
[place]  Patient:    [name], [name] Visit Number: [id number] MRN: [id number]          Service Type: END Location: ENDO Attending Physician:  Orland Mustard Dictated by:   Llana Aliment. Randa Evens, M.D. Admit Date:  05/11/2001   CC:         Erskine Speed, M.D.   Procedure Report  DATE OF BIRTH:  [birth year]  PROCEDURE:  Colonoscopy.  SURGEON:  James L. Edwards, M.D.  MEDICATIONS:  Fentanyl 75 mg and Versed 7.5 mg.  SCOPE:  Pediatric Olympus video colonoscope.  INDICATIONS:  Colon cancer screening.  DESCRIPTION OF PROCEDURE:  The procedure had been explained to the patient and consent obtained.  With the patient in the left lateral decubitus position, the Olympus pediatric video colonoscope was inserted and advanced under direct visualization.  The prep was quite good.  We were able to reach the cecum without difficulty.  The ileocecal valve and appendiceal orifice were seen. The scope was withdrawn and the cecum, ascending colon, hepatic flexure, transverse colon, splenic flexure, descending, and sigmoid colon were seen well upon removal.  No polyps were seen throughout the colon.  There were a few scattered diverticula.  The scope was withdrawn.  The patient tolerated the procedure well.  She was maintained on low flow oxygen and pulse oximeter throughout the procedure.  ASSESSMENT:  Mild diverticulosis, no evidence of polyps.  PLAN:  Will recommend yearly Hemoccults.  Consider repeating in 10 years.  ADDENDUM:  The pediatric scope was inserted and advanced under direct visualization.  The prep was excellent.  We were able to reach to the cecum without difficulty. Dictated by:   Llana Aliment. Randa Evens, M.D. Attending Physician:  Orland Mustard DD:  05/11/01 TD:  05/11/01 Job: [id number]

## 2010-08-23 NOTE — Op Note (Signed)
[name], [name]                ACCOUNT NO.:  [id number]   MEDICAL RECORD NO.:  [id number]          PATIENT TYPE:  INP   LOCATION:  1621                         FACILITY:  [facility]   PHYSICIAN:  Petra Kuba, M.D.    DATE OF BIRTH:  1927-11-09   DATE OF PROCEDURE:  10/25/2005  DATE OF DISCHARGE:                                 OPERATIVE REPORT   PROCEDURE:  Endoscopic retrograde cholangiopancreatography, sphincterotomy  with stent placement.   INDICATION:  Bile leak.   CONSENT:  Signed after risks, benefits, methods, and options thoroughly  discussed with the patient and her husband.   MEDICINES USED:  Fentanyl 25 mcg, Versed 5 mg.   DESCRIPTION OF PROCEDURE:  A side-viewing therapeutic video duodenoscope was  inserted by direct vision into the stomach and through the antrum, pylorus,  into the duodenum; and a small but normal-appearing ampulla was brought into  view.  Using the triple-lumen sphincterotome loaded with Jag-wire we able to  get deep selective cannulation.  No PD injections were done.  The CBD was  normal.  No obvious stones were seen.  Initially we thought we saw some  leaking at the cystic duct remnant, but on reviewing the films later, which  probably was an error, based on the contrast dye moving through the bowel;  and confusing the x-rays.  The duct system not overfilled, not wanting to  increase the pressure in the duct cyst, the Jag-wire was advanced into the  intrahepatic; and a small sphincterotomy was done in an effort to assist  with stent placement.   No signs of bleeding were seen.  We then went ahead and removed the  sphincterotome and placed the 10-French, 5-cm stent in the customary fashion  over the wire which was confirmed endoscopically and fluoroscopically in the  proper position.  The introducer wire was removed.  There was some very  sluggish contrast and bile drainage, probably due to her __________ left  side, due to the procedure.  But,  under endoscopic fluoroscopic view seemed  to be in the right place.  We elected to stop the procedure at this  junction.  The scope was removed.  The patient tolerated the procedure well.  There was no obvious immediate complications.   ENDOSCOPIC DIAGNOSES:  1.  Small, but normal, looking ampulla.  2.  Initially thought there was some cystic duct leaking, but on retrospect      we were probably fooled by the __________ contrast in the bowel  3.  No obvious stone was seen.  4.  Small sphincterotomy was done.  5.  No PD injections or wire advanced through the pancreas throughout the      procedure.  6.  Status post a 10-French 5-cm stent placed with minimal drainage probably      due to the patient's position.   PLAN:  Observe for delayed complications; if okay in the a.m. can slowly  advance diet.  Will repeat labs and continue antibiotics for now.  Possibly,  if this is not helpful, she might need a longer stent since this  ends I do  not believe crosses the cystic duct takeoff, but rarely doing it in to place  one that far up the duct.  If the patient does well, stent will be removed  in 6-12 weeks p.r.n.           ______________________________  Petra Kuba, M.D.     MEM/MEDQ  D:  10/25/2005  T:  10/25/2005  Job:  [id number]   cc:   Gita Kudo, M.D.  [address], [address]  [place]  [place] [address]   Llana Aliment. Malon Kindle., M.D.  Fax: [phone]   Deanna Artis. Sharene Skeans, M.D.  Fax: [phone]

## 2010-08-23 NOTE — H&P (Signed)
[name], [name]                ACCOUNT NO.:  [id number]   MEDICAL RECORD NO.:  [id number]          PATIENT TYPE:  EMS   LOCATION:  ED                           FACILITY:  [facility]   PHYSICIAN:  Wilmon Arms. Corliss Skains, M.D. DATE OF BIRTH:  [birth year]   DATE OF ADMISSION:  10/25/2005  DATE OF DISCHARGE:                                HISTORY & PHYSICAL   CHIEF COMPLAINT:  Right upper quadrant pain.   The patient is a 77 year old female who presents with several hours' acute  onset of right upper quadrant pain.  The patient underwent a laparoscopic  cholecystectomy with intraoperative cholangiogram on October 22, 2005, by  Gita Kudo, M.D..  The pathology report showed chronic cholecystitis  with sludge.  The operative report shows that the case was fairly  uncomplicated.  The cholangiogram was normal, with her common bile duct  seemed mildly enlarged.  The patient was discharged home on the morning of  July 19 and was doing well with minimal pain.  However, this morning she had  gotten out of bed to use the bathroom and had sudden onset of right upper  quadrant pain.  No associated nausea, vomiting or diarrhea.  Had no fever.  The patient was brought to the emergency department by EMS.  She was  evaluated by Dr. Radford Pax, who consulted [place].   PAST MEDICAL HISTORY:  1.  Gastroesophageal reflux disease.  2.  Rosacea.  3.  Osteoarthritis.   PAST SURGICAL HISTORY:  1.  Laparoscopic cholecystectomy.  2.  EGD, which was reportedly normal.  3.  Rotator cuff repair.  4.  Excision of Morton's neuroma.   ALLERGIES:  CODEINE.   MEDICATIONS:  Darvocet, doxycycline, Prilosec, Aleve p.r.n.   SOCIAL HISTORY:  Nonsmoker.  Minimal EtOH.   FAMILY HISTORY:  Significant for coronary artery disease.   PHYSICAL EXAMINATION:  VITAL SIGNS:  Temperature 97.8, blood pressure  107/67, pulse 73, respirations 24.  GENERAL:  This is a thin white female in no apparent distress.  HEENT:  EOMI.  Sclerae  anicteric.  NECK:  No mass, no thyromegaly.  LUNGS:  Clear to auscultation bilaterally.  Normal respiratory effort.  CARDIAC:  Regular rate and rhythm.  No murmur.  ABDOMEN:  Well-healing surgical incisions with Steri-Strips in place.  No  distension.  Active bowel sounds.  Moderately tender in the right upper  quadrant.  No rebound, mild guarding.  EXTREMITIES:  Warm and dry.  No sign of edema.  SKIN:  No jaundice.   LABORATORY DATA:  White count 10.1, hemoglobin 12.8.  Electrolytes:  Sodium  141, potassium 4.4, BUN 16, creatinine 1.25, glucose 86, bilirubin 0.8, AST  113, ALT 121, alkaline phosphatase 52.  Lipase 35.  CT scan of the in the  abdomen and pelvis showed post cholecystectomy changes with low-attenuation  perihepatic fluid.  Not consistent with bleeding.  This may represent an  either irrigation or a bile leak.   IMPRESSION:  Right upper quadrant pain status post laparoscopic  cholecystectomy, rule out bile leak.   PLAN:  Admit for IV hydration and pain control  and will obtain a HIDA scan  today.      Wilmon Arms. Tsuei, M.D.  Electronically Signed     MKT/MEDQ  D:  10/25/2005  T:  10/25/2005  Job:  [id number]

## 2010-12-23 ENCOUNTER — Other Ambulatory Visit: Payer: Self-pay | Admitting: Interventional Radiology

## 2010-12-23 DIAGNOSIS — I70201 Unspecified atherosclerosis of native arteries of extremities, right leg: Secondary | ICD-10-CM

## 2010-12-23 DIAGNOSIS — I739 Peripheral vascular disease, unspecified: Secondary | ICD-10-CM

## 2010-12-24 ENCOUNTER — Other Ambulatory Visit: Payer: Self-pay | Admitting: Internal Medicine

## 2010-12-24 ENCOUNTER — Ambulatory Visit
Admission: RE | Admit: 2010-12-24 | Discharge: 2010-12-24 | Disposition: A | Discharge status: Home / Self Care | Payer: Medicare Other | Source: Ambulatory Visit | Attending: Internal Medicine | Admitting: Internal Medicine

## 2010-12-24 DIAGNOSIS — R05 Cough: Secondary | ICD-10-CM

## 2010-12-25 ENCOUNTER — Other Ambulatory Visit: Payer: Self-pay | Admitting: Internal Medicine

## 2010-12-25 DIAGNOSIS — I70201 Unspecified atherosclerosis of native arteries of extremities, right leg: Secondary | ICD-10-CM

## 2010-12-25 DIAGNOSIS — I739 Peripheral vascular disease, unspecified: Secondary | ICD-10-CM

## 2011-01-15 ENCOUNTER — Other Ambulatory Visit: Payer: Self-pay | Admitting: Interventional Radiology

## 2011-01-15 ENCOUNTER — Ambulatory Visit
Admission: RE | Admit: 2011-01-15 | Discharge: 2011-01-15 | Disposition: A | Discharge status: Home / Self Care | Payer: Medicare Other | Source: Ambulatory Visit | Attending: Interventional Radiology | Admitting: Interventional Radiology

## 2011-01-15 ENCOUNTER — Ambulatory Visit
Admission: RE | Admit: 2011-01-15 | Discharge: 2011-01-15 | Disposition: A | Discharge status: Home / Self Care | Payer: Medicare Other | Source: Ambulatory Visit | Attending: Internal Medicine | Admitting: Internal Medicine

## 2011-01-15 DIAGNOSIS — I70201 Unspecified atherosclerosis of native arteries of extremities, right leg: Secondary | ICD-10-CM

## 2011-01-15 DIAGNOSIS — I739 Peripheral vascular disease, unspecified: Secondary | ICD-10-CM

## 2011-02-24 ENCOUNTER — Other Ambulatory Visit (HOSPITAL_COMMUNITY): Payer: Self-pay | Admitting: Internal Medicine

## 2011-02-24 DIAGNOSIS — R0989 Other specified symptoms and signs involving the circulatory and respiratory systems: Secondary | ICD-10-CM

## 2011-02-24 DIAGNOSIS — J449 Chronic obstructive pulmonary disease, unspecified: Secondary | ICD-10-CM

## 2011-03-05 ENCOUNTER — Ambulatory Visit (HOSPITAL_COMMUNITY)
Admission: RE | Admit: 2011-03-05 | Discharge: 2011-03-05 | Disposition: A | Discharge status: Home / Self Care | Payer: Medicare Other | Source: Ambulatory Visit | Attending: Internal Medicine | Admitting: Internal Medicine

## 2011-03-05 DIAGNOSIS — I739 Peripheral vascular disease, unspecified: Secondary | ICD-10-CM | POA: Insufficient documentation

## 2011-03-05 DIAGNOSIS — I700 Atherosclerosis of aorta: Secondary | ICD-10-CM | POA: Insufficient documentation

## 2011-03-05 DIAGNOSIS — R0989 Other specified symptoms and signs involving the circulatory and respiratory systems: Secondary | ICD-10-CM

## 2011-03-05 DIAGNOSIS — I70201 Unspecified atherosclerosis of native arteries of extremities, right leg: Secondary | ICD-10-CM

## 2011-03-05 DIAGNOSIS — R1013 Epigastric pain: Secondary | ICD-10-CM | POA: Insufficient documentation

## 2011-03-05 MED ORDER — ALBUTEROL SULFATE (5 MG/ML) 0.5% IN NEBU: 2.5000 mg | INHALATION_SOLUTION | Freq: Once | RESPIRATORY_TRACT | Status: DC

## 2011-03-05 NOTE — Progress Notes (Signed)
*  PRELIMINARY RESULTS*  Carotid Doppler has been performed.  Right Mid-Distal ICA shows increased velocities which is most likely due to severe tortuosity of the vessel. Left Mid ICA shows increased velocities which is most likely due to severe tortuosity of the vessel. There is no plaque to support the increase bilaterally.  Right Vertebral flow is antegrade.  Left Vertebral flow is retrograde, along with retrograde subclavian artery flow. Right BP is 160/68, while the Left BP is 108/74. This is suggestive of Left subclavian steal syndrome. sly  Farrel Demark 03/05/2011, 10:22 AM

## 2011-12-18 ENCOUNTER — Other Ambulatory Visit: Payer: Self-pay | Admitting: Internal Medicine

## 2011-12-18 ENCOUNTER — Ambulatory Visit
Admission: RE | Admit: 2011-12-18 | Discharge: 2011-12-18 | Disposition: A | Discharge status: Home / Self Care | Payer: Medicare Other | Source: Ambulatory Visit | Attending: Internal Medicine | Admitting: Internal Medicine

## 2011-12-18 DIAGNOSIS — R52 Pain, unspecified: Secondary | ICD-10-CM

## 2011-12-18 MED ORDER — IOHEXOL 300 MG/ML  SOLN
30.0000 mL | Freq: Once | INTRAMUSCULAR | Status: AC | PRN
  Administered 2011-12-18: 30 mL via ORAL

## 2011-12-18 MED ORDER — IOHEXOL 300 MG/ML  SOLN
100.0000 mL | Freq: Once | INTRAMUSCULAR | Status: AC | PRN
  Administered 2011-12-18: 100 mL via INTRAVENOUS

## 2011-12-19 ENCOUNTER — Other Ambulatory Visit: Payer: Self-pay | Admitting: Internal Medicine

## 2011-12-19 DIAGNOSIS — I70201 Unspecified atherosclerosis of native arteries of extremities, right leg: Secondary | ICD-10-CM

## 2012-02-05 ENCOUNTER — Other Ambulatory Visit: Payer: Medicare Other

## 2012-03-11 ENCOUNTER — Ambulatory Visit
Admission: RE | Admit: 2012-03-11 | Discharge: 2012-03-11 | Disposition: A | Discharge status: Home / Self Care | Payer: Medicare Other | Source: Ambulatory Visit | Attending: Internal Medicine | Admitting: Internal Medicine

## 2012-03-11 ENCOUNTER — Ambulatory Visit
Admission: RE | Admit: 2012-03-11 | Discharge: 2012-03-11 | Disposition: A | Discharge status: Home / Self Care | Payer: Medicare Other | Source: Ambulatory Visit | Attending: Interventional Radiology | Admitting: Interventional Radiology

## 2012-03-11 DIAGNOSIS — I70201 Unspecified atherosclerosis of native arteries of extremities, right leg: Secondary | ICD-10-CM

## 2012-10-05 ENCOUNTER — Other Ambulatory Visit: Payer: Self-pay | Admitting: Internal Medicine

## 2012-10-05 DIAGNOSIS — M542 Cervicalgia: Secondary | ICD-10-CM

## 2012-10-05 DIAGNOSIS — R599 Enlarged lymph nodes, unspecified: Secondary | ICD-10-CM

## 2012-10-06 ENCOUNTER — Ambulatory Visit
Admission: RE | Admit: 2012-10-06 | Discharge: 2012-10-06 | Disposition: A | Discharge status: Home / Self Care | Payer: Medicare Other | Source: Ambulatory Visit | Attending: Internal Medicine | Admitting: Internal Medicine

## 2012-10-06 DIAGNOSIS — R599 Enlarged lymph nodes, unspecified: Secondary | ICD-10-CM

## 2012-10-06 DIAGNOSIS — M542 Cervicalgia: Secondary | ICD-10-CM

## 2012-10-06 MED ORDER — IOHEXOL 300 MG/ML  SOLN
40.0000 mL | Freq: Once | INTRAMUSCULAR | Status: AC | PRN
  Administered 2012-10-06: 40 mL via INTRAVENOUS

## 2012-10-11 ENCOUNTER — Ambulatory Visit
Admission: RE | Admit: 2012-10-11 | Discharge: 2012-10-11 | Disposition: A | Discharge status: Home / Self Care | Payer: Medicare Other | Source: Ambulatory Visit | Attending: Internal Medicine | Admitting: Internal Medicine

## 2012-10-11 DIAGNOSIS — M542 Cervicalgia: Secondary | ICD-10-CM

## 2013-02-10 ENCOUNTER — Other Ambulatory Visit (HOSPITAL_COMMUNITY): Payer: Self-pay | Admitting: Interventional Radiology

## 2013-02-10 DIAGNOSIS — I739 Peripheral vascular disease, unspecified: Secondary | ICD-10-CM

## 2013-03-22 ENCOUNTER — Ambulatory Visit
Admission: RE | Admit: 2013-03-22 | Discharge: 2013-03-22 | Disposition: A | Discharge status: Home / Self Care | Payer: Medicare Other | Source: Ambulatory Visit | Attending: Interventional Radiology | Admitting: Interventional Radiology

## 2013-03-22 DIAGNOSIS — I739 Peripheral vascular disease, unspecified: Secondary | ICD-10-CM

## 2013-03-28 ENCOUNTER — Other Ambulatory Visit (HOSPITAL_COMMUNITY): Payer: Self-pay | Admitting: Internal Medicine

## 2013-03-28 DIAGNOSIS — I4892 Unspecified atrial flutter: Secondary | ICD-10-CM

## 2013-04-05 ENCOUNTER — Ambulatory Visit (HOSPITAL_COMMUNITY)
Admission: RE | Admit: 2013-04-05 | Discharge: 2013-04-05 | Disposition: A | Discharge status: Home / Self Care | Payer: Medicare Other | Source: Ambulatory Visit | Attending: Internal Medicine | Admitting: Internal Medicine

## 2013-04-05 DIAGNOSIS — I079 Rheumatic tricuspid valve disease, unspecified: Secondary | ICD-10-CM | POA: Insufficient documentation

## 2013-04-05 DIAGNOSIS — I4892 Unspecified atrial flutter: Secondary | ICD-10-CM | POA: Insufficient documentation

## 2013-04-05 DIAGNOSIS — I369 Nonrheumatic tricuspid valve disorder, unspecified: Secondary | ICD-10-CM

## 2013-04-05 NOTE — Progress Notes (Signed)
  Echocardiogram 2D Echocardiogram has been performed.  Arvil Chaco 04/05/2013, 1:51 PM

## 2013-05-26 ENCOUNTER — Other Ambulatory Visit: Payer: Self-pay | Admitting: Internal Medicine

## 2013-05-26 DIAGNOSIS — M810 Age-related osteoporosis without current pathological fracture: Secondary | ICD-10-CM

## 2013-07-05 ENCOUNTER — Telehealth: Payer: Self-pay | Admitting: Gastroenterology

## 2013-07-05 NOTE — Telephone Encounter (Signed)
Pt has been having rectal bleeding. Hgb has been trending down, CBC drawn today but result not back yet. Dr. Neva SeatGreene requests pt be seen. Pt scheduled to see Dr. Arlyce DiceKaplan tomorrow at 3pm. Lew Dawesitter to notify pt of appt and fax CBC from today.

## 2013-07-06 ENCOUNTER — Encounter: Payer: Self-pay | Admitting: Gastroenterology

## 2013-07-06 ENCOUNTER — Ambulatory Visit (INDEPENDENT_AMBULATORY_CARE_PROVIDER_SITE_OTHER): Payer: Medicare Other | Admitting: Gastroenterology

## 2013-07-06 VITALS — BP 102/74 | HR 68 | Ht 63.5 in | Wt 139.0 lb

## 2013-07-06 DIAGNOSIS — K921 Melena: Secondary | ICD-10-CM | POA: Insufficient documentation

## 2013-07-06 NOTE — Progress Notes (Signed)
_                                                                                                                History of Present Illness: ERCP in 2007 was normal.  A plastic stent was placed because of a prior concern for a biliary leak.  This was subsequently removed.  Esophagitis, nonerosive, was seen in 2008.  As colonoscopy was 10 years ago.  Approximately 4 days ago she developed melenic stools.  There were soft and black.  This continued the following day.  She was seen by her PCP yesterday where she tested Hemoccult positive.  Hemoglobin was 10.7 which apparently was stable.  She was taking xarelto for atrial flutter which was discontinued.  She is on no gastric irritants including nonsteroidals.  There's been no change in bowel habits.    Past Medical History  Diagnosis Date  . Glaucoma     Severe   Past Surgical History  Procedure Laterality Date  . Gallbladder surgery    . Leg stent     family history includes Diabetes in her maternal uncle; Throat cancer in her maternal grandfather and mother. Current Outpatient Prescriptions  Medication Sig Dispense Refill  . amitriptyline (ELAVIL) 10 MG tablet       . ampicillin (PRINCIPEN) 500 MG capsule       . CRESTOR 5 MG tablet       . diltiazem (CARDIZEM CD) 120 MG 24 hr capsule       . LUMIGAN 0.01 % SOLN       . traMADol-acetaminophen (ULTRACET) 37.5-325 MG per tablet       . TRAVATAN Z 0.004 % SOLN ophthalmic solution        No current facility-administered medications for this visit.   Allergies as of 07/06/2013 - Review Complete 07/06/2013  Allergen Reaction Noted  . Codeine sulfate  05/08/2010    reports that she has quit smoking. She has never used smokeless tobacco. She reports that she drinks alcohol. She reports that she does not use illicit drugs.     Review of Systems: Pertinent positive and negative review of systems were noted in the above HPI section. All other review of systems were  otherwise negative.  Vital signs were reviewed in today's medical record Physical Exam: General: Well developed , well nourished, no acute distress Skin: anicteric Head: Normocephalic and atraumatic Eyes:  sclerae anicteric, EOMI Ears: Normal auditory acuity Mouth: No deformity or lesions Neck: Supple, no masses or thyromegaly Lungs: Clear throughout to auscultation Heart: Regular rate and rhythm; no murmurs, rubs or bruits Abdomen: Soft, non tender and non distended. No masses, hepatosplenomegaly or hernias noted. Normal Bowel sounds Rectal:deferred Musculoskeletal: Symmetrical with no gross deformities  Skin: No lesions on visible extremities Pulses:  Normal pulses noted Extremities: No clubbing, cyanosis, edema or deformities noted Neurological: Alert oriented x 4, grossly nonfocal Cervical Nodes:  No significant cervical adenopathy Inguinal Nodes: No significant inguinal adenopathy Psychological:  Alert and cooperative. Normal mood and  affect  See Assessment and Plan under Problem List

## 2013-07-06 NOTE — Assessment & Plan Note (Addendum)
Subacute GI bleed in the setting of anticoagulation therapy (xarelto).  Although not at risk for peptic ulcer disease this should be ruled out.  Bleeding from diverticulum, AVMs or neoplasm in the colon must also be considered.    Recommendations #1 begin therapy with Nexium 40 mg a day #2 upper endoscopy #3 colonoscopy if endoscopy if not diagnostic #4 hold xarelto

## 2013-07-06 NOTE — Patient Instructions (Signed)
We have scheduled [facility] End[facility] 07/08/2013 [facility] Separate instructions have been given

## 2013-07-07 ENCOUNTER — Encounter (HOSPITAL_COMMUNITY): Payer: Self-pay | Admitting: *Deleted

## 2013-07-08 ENCOUNTER — Ambulatory Visit (HOSPITAL_COMMUNITY)
Admission: RE | Admit: 2013-07-08 | Discharge: 2013-07-08 | Disposition: A | Discharge status: Home / Self Care | Payer: Medicare Other | Source: Ambulatory Visit | Attending: Gastroenterology | Admitting: Gastroenterology

## 2013-07-08 ENCOUNTER — Encounter (HOSPITAL_COMMUNITY): Payer: Self-pay | Admitting: Certified Registered Nurse Anesthetist

## 2013-07-08 ENCOUNTER — Encounter (HOSPITAL_COMMUNITY)
Admission: RE | Disposition: A | Discharge status: Home / Self Care | Payer: Self-pay | Source: Ambulatory Visit | Attending: Gastroenterology

## 2013-07-08 ENCOUNTER — Encounter (HOSPITAL_COMMUNITY): Payer: Self-pay | Admitting: *Deleted

## 2013-07-08 DIAGNOSIS — K921 Melena: Secondary | ICD-10-CM | POA: Insufficient documentation

## 2013-07-08 DIAGNOSIS — Z7901 Long term (current) use of anticoagulants: Secondary | ICD-10-CM | POA: Insufficient documentation

## 2013-07-08 DIAGNOSIS — Z79899 Other long term (current) drug therapy: Secondary | ICD-10-CM | POA: Insufficient documentation

## 2013-07-08 DIAGNOSIS — K222 Esophageal obstruction: Secondary | ICD-10-CM | POA: Insufficient documentation

## 2013-07-08 DIAGNOSIS — Z87891 Personal history of nicotine dependence: Secondary | ICD-10-CM | POA: Insufficient documentation

## 2013-07-08 DIAGNOSIS — H409 Unspecified glaucoma: Secondary | ICD-10-CM | POA: Insufficient documentation

## 2013-07-08 HISTORY — PX: ESOPHAGOGASTRODUODENOSCOPY: SHX5428

## 2013-07-08 HISTORY — DX: Pure hypercholesterolemia, unspecified: E78.00

## 2013-07-08 HISTORY — DX: Unspecified atrial flutter: I48.92

## 2013-07-08 HISTORY — DX: Melena: K92.1

## 2013-07-08 HISTORY — DX: Cardiac arrhythmia, unspecified: I49.9

## 2013-07-08 SURGERY — EGD (ESOPHAGOGASTRODUODENOSCOPY)
Anesthesia: Monitor Anesthesia Care

## 2013-07-08 MED ORDER — BUTAMBEN-TETRACAINE-BENZOCAINE 2-2-14 % EX AERO
INHALATION_SPRAY | CUTANEOUS | Status: DC | PRN
Start: 2013-07-08 — End: 2013-07-08
  Administered 2013-07-08: 2 via TOPICAL

## 2013-07-08 MED ORDER — FENTANYL CITRATE 0.05 MG/ML IJ SOLN
INTRAMUSCULAR | Status: AC
  Filled 2013-07-08: qty 2

## 2013-07-08 MED ORDER — PROPOFOL 10 MG/ML IV BOLUS
INTRAVENOUS | Status: AC
  Filled 2013-07-08: qty 20

## 2013-07-08 MED ORDER — LACTATED RINGERS IV SOLN
INTRAVENOUS | Status: DC
  Administered 2013-07-08: 1000 mL via INTRAVENOUS

## 2013-07-08 MED ORDER — SODIUM CHLORIDE 0.9 % IV SOLN: INTRAVENOUS | Status: DC

## 2013-07-08 MED ORDER — FENTANYL CITRATE 0.05 MG/ML IJ SOLN
INTRAMUSCULAR | Status: DC | PRN
  Administered 2013-07-08: 25 ug via INTRAVENOUS

## 2013-07-08 MED ORDER — KETAMINE HCL 10 MG/ML IJ SOLN
INTRAMUSCULAR | Status: AC
  Filled 2013-07-08: qty 1

## 2013-07-08 MED ORDER — MIDAZOLAM HCL 10 MG/2ML IJ SOLN
INTRAMUSCULAR | Status: DC | PRN
  Administered 2013-07-08: 2 mg via INTRAVENOUS

## 2013-07-08 MED ORDER — MIDAZOLAM HCL 10 MG/2ML IJ SOLN
INTRAMUSCULAR | Status: AC
  Filled 2013-07-08: qty 2

## 2013-07-08 MED ORDER — LIDOCAINE HCL (CARDIAC) 20 MG/ML IV SOLN
INTRAVENOUS | Status: AC
  Filled 2013-07-08: qty 5

## 2013-07-08 MED ORDER — MIDAZOLAM HCL 2 MG/2ML IJ SOLN
INTRAMUSCULAR | Status: AC
  Filled 2013-07-08: qty 2

## 2013-07-08 MED ORDER — GLYCOPYRROLATE 0.2 MG/ML IJ SOLN
INTRAMUSCULAR | Status: AC
  Filled 2013-07-08: qty 1

## 2013-07-08 MED ORDER — ONDANSETRON HCL 4 MG/2ML IJ SOLN
INTRAMUSCULAR | Status: AC
  Filled 2013-07-08: qty 2

## 2013-07-08 NOTE — Op Note (Signed)
[facility] [address] [place], [id number]   ENDOSCOPY PROCEDURE REPORT  PATIENT: [name], Shelby L.  MR#: [id number] BIRTHDATE: 06/28/1927 , 86  yrs. old GENDER: Female ENDOSCOPIST: Louis Meckelobert D Dimitris Shanahan, MD REFERRED BY:  Nila NephewEdwin Green, M.D. PROCEDURE DATE:  07/08/2013 PROCEDURE:  EGD, diagnostic ASA CLASS:     Class III INDICATIONS:  Melena.  in the setting of therapy with Eliquis MEDICATIONS: These medications were titrated to patient response per physician's verbal order, Versed 2 mg IV, and Fentanyl-Detailed 25 mg IV TOPICAL ANESTHETIC: Cetacaine Spray  DESCRIPTION OF PROCEDURE: After the risks benefits and alternatives of the procedure were thoroughly explained, informed consent was obtained.  The Pentax Gastroscope [id number] endoscope was introduced through the mouth and advanced to the third portion of the duodenum. Without limitations.  The instrument was slowly withdrawn as the mucosa was fully examined.      An early nonobstructing stricture was seen at the GE junction.   An early nonobstructing stricture was seen at the GE junction.   The remainder of the upper endoscopy exam was otherwise normal. Retroflexed views revealed no abnormalities.     The scope was then withdrawn from the patient and the procedure completed.  COMPLICATIONS: There were no complications. ENDOSCOPIC IMPRESSION: 1.   An early nonobstructing stricture was seen at the GE junction.  2.   The remainder of the upper endoscopy exam was otherwise normal  RECOMMENDATIONS: 1.  continue to hold eliquis 2.  colonoscopy REPEAT EXAM:  eSigned:  Louis Meckelobert D Giulianna Rocha, MD 07/08/2013 9:34 AM   CC:

## 2013-07-08 NOTE — H&P (View-Only) (Signed)
_                                                                                                                History of Present Illness: ERCP in 2007 was normal.  A plastic stent was placed because of a prior concern for a biliary leak.  This was subsequently removed.  Esophagitis, nonerosive, was seen in 2008.  As colonoscopy was 10 years ago.  Approximately 4 days ago she developed melenic stools.  There were soft and black.  This continued the following day.  She was seen by her PCP yesterday where she tested Hemoccult positive.  Hemoglobin was 10.7 which apparently was stable.  She was taking xarelto for atrial flutter which was discontinued.  She is on no gastric irritants including nonsteroidals.  There's been no change in bowel habits.    Past Medical History  Diagnosis Date  . Glaucoma     Severe   Past Surgical History  Procedure Laterality Date  . Gallbladder surgery    . Leg stent     family history includes Diabetes in her maternal uncle; Throat cancer in her maternal grandfather and mother. Current Outpatient Prescriptions  Medication Sig Dispense Refill  . amitriptyline (ELAVIL) 10 MG tablet       . ampicillin (PRINCIPEN) 500 MG capsule       . CRESTOR 5 MG tablet       . diltiazem (CARDIZEM CD) 120 MG 24 hr capsule       . LUMIGAN 0.01 % SOLN       . traMADol-acetaminophen (ULTRACET) 37.5-325 MG per tablet       . TRAVATAN Z 0.004 % SOLN ophthalmic solution        No current facility-administered medications for this visit.   Allergies as of 07/06/2013 - Review Complete 07/06/2013  Allergen Reaction Noted  . Codeine sulfate  05/08/2010    reports that she has quit smoking. She has never used smokeless tobacco. She reports that she drinks alcohol. She reports that she does not use illicit drugs.     Review of Systems: Pertinent positive and negative review of systems were noted in the above HPI section. All other review of systems were  otherwise negative.  Vital signs were reviewed in today's medical record Physical Exam: General: Well developed , well nourished, no acute distress Skin: anicteric Head: Normocephalic and atraumatic Eyes:  sclerae anicteric, EOMI Ears: Normal auditory acuity Mouth: No deformity or lesions Neck: Supple, no masses or thyromegaly Lungs: Clear throughout to auscultation Heart: Regular rate and rhythm; no murmurs, rubs or bruits Abdomen: Soft, non tender and non distended. No masses, hepatosplenomegaly or hernias noted. Normal Bowel sounds Rectal:deferred Musculoskeletal: Symmetrical with no gross deformities  Skin: No lesions on visible extremities Pulses:  Normal pulses noted Extremities: No clubbing, cyanosis, edema or deformities noted Neurological: Alert oriented x 4, grossly nonfocal Cervical Nodes:  No significant cervical adenopathy Inguinal Nodes: No significant inguinal adenopathy Psychological:  Alert and cooperative. Normal mood and  affect  See Assessment and Plan under Problem List

## 2013-07-08 NOTE — Discharge Instructions (Signed)
My office will contact you to schedule colonoscopyColonoscopy, Care After Refer to this sheet in the next few weeks. These instructions provide you with information on caring for yourself after your procedure. Your health care provider may also give you more specific instructions. Your treatment has been planned according to current medical practices, but problems sometimes occur. Call your health care provider if you have any problems or questions after your procedure. WHAT TO EXPECT AFTER THE PROCEDURE  After your procedure, it is typical to have the following:  A small amount of blood in your stool.  Moderate amounts of gas and mild abdominal cramping or bloating. HOME CARE INSTRUCTIONS  Do not drive, operate machinery, or sign important documents for 24 hours.  You may shower and resume your regular physical activities, but move at a slower pace for the first 24 hours.  Take frequent rest periods for the first 24 hours.  Walk around or put a warm pack on your abdomen to help reduce abdominal cramping and bloating.  Drink enough fluids to keep your urine clear or pale yellow.  You may resume your normal diet as instructed by your health care provider. Avoid heavy or fried foods that are hard to digest.  Avoid drinking alcohol for 24 hours or as instructed by your health care provider.  Only take over-the-counter or prescription medicines as directed by your health care provider.  If a tissue sample (biopsy) was taken during your procedure:  Do not take aspirin or blood thinners for 7 days, or as instructed by your health care provider.  Do not drink alcohol for 7 days, or as instructed by your health care provider.  Eat soft foods for the first 24 hours. SEEK MEDICAL CARE IF: You have persistent spotting of blood in your stool 2 3 days after the procedure. SEEK IMMEDIATE MEDICAL CARE IF:  You have more than a small spotting of blood in your stool.  You pass large blood clots  in your stool.  Your abdomen is swollen (distended).  You have nausea or vomiting.  You have a fever.  You have increasing abdominal pain that is not relieved with medicine. Document Released: 11/06/2003 Document Revised: 01/12/2013 Document Reviewed: 11/29/2012 [facility] Patient Information 2014 [place]. Esophagogastroduodenoscopy Care After Refer to this sheet in the next few weeks. These instructions provide you with information on caring for yourself after your procedure. Your caregiver may also give you more specific instructions. Your treatment has been planned according to current medical practices, but problems sometimes occur. Call your caregiver if you have any problems or questions after your procedure.  HOME CARE INSTRUCTIONS  Do not eat or drink anything until the numbing medicine (local anesthetic) has worn off and your gag reflex has returned. You will know that the local anesthetic has worn off when you can swallow comfortably.  Do not drive for 12 hours after the procedure or as directed by your caregiver.  Only take medicines as directed by your caregiver. SEEK MEDICAL CARE IF:   You cannot stop coughing.  You are not urinating at all or less than usual. SEEK IMMEDIATE MEDICAL CARE IF:  You have difficulty swallowing.  You cannot eat or drink.  You have worsening throat or chest pain.  You have dizziness, lightheadedness, or you faint.  You have nausea or vomiting.  You have chills.  You have a fever.  You have severe abdominal pain.  You have black, tarry, or bloody stools. Document Released: 03/10/2012 Document Reviewed: 03/10/2012  ExitCare® Patient Information ©2014 [employer], LLC. ° °

## 2013-07-08 NOTE — Interval H&P Note (Signed)
History and Physical Interval Note:  07/08/2013 8:51 AM  [name]  has presented today for surgery, with the diagnosis of Anemia [285.9]  The various methods of treatment have been discussed with the patient and family. After consideration of risks, benefits and other options for treatment, the patient has consented to  Procedure(s) with comments: ESOPHAGOGASTRODUODENOSCOPY (EGD) (N/A) - Per [place] HunSuzi Charmian MuffBrewer, will try to have CRNA available, but will not guaranteee. Ok to schedule per Clydie BraunKaren 07/06/13 as a surgical intervention .  The patient's history has been reviewed, patient examined, no change in status, stable for surgery.  I have reviewed the patient's chart and labs.  Questions were answered to the patient's satisfaction.     The recent H&P (dated *07/06/13**) was reviewed, the patient was examined and there is no change in the patients condition since that H&P was completed.   [name]  07/08/2013, 8:51 AM   [name]

## 2013-07-11 ENCOUNTER — Telehealth: Payer: Self-pay

## 2013-07-11 ENCOUNTER — Encounter (HOSPITAL_COMMUNITY): Payer: Self-pay | Admitting: Gastroenterology

## 2013-07-11 ENCOUNTER — Other Ambulatory Visit: Payer: Self-pay

## 2013-07-11 DIAGNOSIS — K921 Melena: Secondary | ICD-10-CM

## 2013-07-11 NOTE — Telephone Encounter (Signed)
Pt scheduled for previsit 07/12/13@9 :30am. [facility] 07/15/13@9am . Pt aware of appts. Pt had EGD done 07/08/13 and per pt Dr. Arlyce DiceKaplan had her hold her blood thinner following procedure.

## 2013-07-11 NOTE — Telephone Encounter (Signed)
Message copied by Chrystie NoseHUNT, Eivin Mascio R on Mon Jul 11, 2013  1:06 PM ------      Message from: Melvia HeapsKAPLAN, ROBERT D      Created: Fri Jul 08, 2013  9:36 AM       Bonita QuinLinda, please schedule this patient for colonoscopy for early in the week ------

## 2013-07-12 ENCOUNTER — Ambulatory Visit (AMBULATORY_SURGERY_CENTER): Payer: Self-pay

## 2013-07-12 VITALS — Ht 63.0 in | Wt 109.6 lb

## 2013-07-12 DIAGNOSIS — K921 Melena: Secondary | ICD-10-CM

## 2013-07-12 MED ORDER — NA SULFATE-K SULFATE-MG SULF 17.5-3.13-1.6 GM/177ML PO SOLN: ORAL | Status: DC

## 2013-07-15 ENCOUNTER — Ambulatory Visit (HOSPITAL_COMMUNITY)
Admission: RE | Admit: 2013-07-15 | Discharge: 2013-07-15 | Disposition: A | Discharge status: Home / Self Care | Payer: Medicare Other | Source: Ambulatory Visit | Attending: Gastroenterology | Admitting: Gastroenterology

## 2013-07-15 ENCOUNTER — Encounter (HOSPITAL_COMMUNITY): Payer: Self-pay | Admitting: *Deleted

## 2013-07-15 ENCOUNTER — Encounter (HOSPITAL_COMMUNITY)
Admission: RE | Disposition: A | Discharge status: Home / Self Care | Payer: Self-pay | Source: Ambulatory Visit | Attending: Gastroenterology

## 2013-07-15 DIAGNOSIS — Z87891 Personal history of nicotine dependence: Secondary | ICD-10-CM | POA: Insufficient documentation

## 2013-07-15 DIAGNOSIS — H409 Unspecified glaucoma: Secondary | ICD-10-CM | POA: Insufficient documentation

## 2013-07-15 DIAGNOSIS — K625 Hemorrhage of anus and rectum: Secondary | ICD-10-CM | POA: Insufficient documentation

## 2013-07-15 DIAGNOSIS — K921 Melena: Secondary | ICD-10-CM

## 2013-07-15 DIAGNOSIS — K5730 Diverticulosis of large intestine without perforation or abscess without bleeding: Secondary | ICD-10-CM

## 2013-07-15 DIAGNOSIS — Z79899 Other long term (current) drug therapy: Secondary | ICD-10-CM | POA: Insufficient documentation

## 2013-07-15 HISTORY — PX: COLONOSCOPY: SHX5424

## 2013-07-15 SURGERY — COLONOSCOPY
Anesthesia: Moderate Sedation

## 2013-07-15 MED ORDER — FENTANYL CITRATE 0.05 MG/ML IJ SOLN
INTRAMUSCULAR | Status: DC | PRN
  Administered 2013-07-15: 25 ug via INTRAVENOUS
  Administered 2013-07-15: 12.5 ug via INTRAVENOUS
  Administered 2013-07-15: 25 ug via INTRAVENOUS

## 2013-07-15 MED ORDER — MIDAZOLAM HCL 10 MG/2ML IJ SOLN
INTRAMUSCULAR | Status: AC
  Filled 2013-07-15: qty 2

## 2013-07-15 MED ORDER — FENTANYL CITRATE 0.05 MG/ML IJ SOLN
INTRAMUSCULAR | Status: AC
Start: 2013-07-15 — End: 2013-07-15
  Filled 2013-07-15: qty 2

## 2013-07-15 MED ORDER — SODIUM CHLORIDE 0.9 % IV SOLN
INTRAVENOUS | Status: DC
  Administered 2013-07-15: 500 mL via INTRAVENOUS

## 2013-07-15 MED ORDER — MIDAZOLAM HCL 5 MG/5ML IJ SOLN
INTRAMUSCULAR | Status: DC | PRN
  Administered 2013-07-15 (×2): 1 mg via INTRAVENOUS
  Administered 2013-07-15: 2 mg via INTRAVENOUS

## 2013-07-15 NOTE — Interval H&P Note (Signed)
History and Physical Interval Note:  07/15/2013 8:47 AM  [name]  has presented today for surgery, with the diagnosis of Blood in stool [578.1]  The various methods of treatment have been discussed with the patient and family. After consideration of risks, benefits and other options for treatment, the patient has consented to  Procedure(s): COLONOSCOPY (N/A) as a surgical intervention .  The patient's history has been reviewed, patient examined, no change in status, stable for surgery.  I have reviewed the patient's chart and labs.  Questions were answered to the patient's satisfaction.    The recent H&P (dated *07/06/13**) was reviewed, the patient was examined and there is no change in the patients condition since that H&P was completed.   Louis MeckelRobert D Kaplan  07/15/2013, 8:47 AM    Louis Meckelobert D Kaplan

## 2013-07-15 NOTE — Op Note (Signed)
[facility] [address] [place], [id number]   COLONOSCOPY PROCEDURE REPORT  PATIENT: [name], Shelby L.  MR#: [id number] BIRTHDATE: [birth year] , 86  yrs. old GENDER: Female ENDOSCOPIST: Louis Meckelobert D Kanyla Omeara, MD REFERRED MV:HQIONBY:Edwin Chilton SiGreen, M.D. PROCEDURE DATE:  07/15/2013 PROCEDURE:   Colonoscopy, diagnostic ASA CLASS:   Class II INDICATIONS:Rectal Bleeding. while on xarelto MEDICATIONS: These medications were titrated to patient response per physician's verbal order, Versed 4 mg IV, and Fentanyl-Detailed 62.5 mcg IV  DESCRIPTION OF PROCEDURE:   After the risks benefits and alternatives of the procedure were thoroughly explained, informed consent was obtained.  A digital rectal exam revealed no abnormalities of the rectum.   The Pentax Ped Colon [id number] endoscope was introduced through the anus and advanced to the cecum, which was identified by both the appendix and ileocecal valve. No adverse events experienced.   The quality of the prep was excellent using Suprep  The instrument was then slowly withdrawn as the colon was fully examined.      COLON FINDINGS: Moderate diverticulosis was noted in the sigmoid colon and descending colon.   While there were no gross mucosal abnormalities, there was very slight friability.   While there were no gross mucosal abnormalities, there was very slight friability. The colon was otherwise normal.  There was no diverticulosis, inflammation, polyps or cancers unless previously stated. Retroflexed views revealed no abnormalities. The time to cecum=  . Withdrawal time=9 minutes 20 seconds.  The scope was withdrawn and the procedure completed. COMPLICATIONS: There were no complications.  ENDOSCOPIC IMPRESSION: 1.   Moderate diverticulosis was noted in the sigmoid colon and descending colon  There was very slight mucosal friability without mucosal abnormalities.  Suspect prior rectal bleeding was related to anticoagulation  therapy (xarelto)  RECOMMENDATIONS: Hemoccults in 2-3 weeks  eSigned:  Louis Meckelobert D Loring Liskey, MD 07/15/2013 9:31 AM   cc:   PATIENT NAME:  [name], Shelby L. MR#: [id number]

## 2013-07-15 NOTE — Discharge Instructions (Signed)
Colonoscopy, Care After Refer to this sheet in the next few weeks. These instructions provide you with information on caring for yourself after your procedure. Your health care provider may also give you more specific instructions. Your treatment has been planned according to current medical practices, but problems sometimes occur. Call your health care provider if you have any problems or questions after your procedure. WHAT TO EXPECT AFTER THE PROCEDURE  After your procedure, it is typical to have the following:  A small amount of blood in your stool.  Moderate amounts of gas and mild abdominal cramping or bloating. HOME CARE INSTRUCTIONS  Do not drive, operate machinery, or sign important documents for 24 hours.  You may shower and resume your regular physical activities, but move at a slower pace for the first 24 hours.  Take frequent rest periods for the first 24 hours.  Walk around or put a warm pack on your abdomen to help reduce abdominal cramping and bloating.  Drink enough fluids to keep your urine clear or pale yellow.  You may resume your normal diet as instructed by your health care provider. Avoid heavy or fried foods that are hard to digest.  Avoid drinking alcohol for 24 hours or as instructed by your health care provider.  Only take over-the-counter or prescription medicines as directed by your health care provider.  If a tissue sample (biopsy) was taken during your procedure:  Do not take aspirin or blood thinners for 7 days, or as instructed by your health care provider.  Do not drink alcohol for 7 days, or as instructed by your health care provider.  Eat soft foods for the first 24 hours. SEEK MEDICAL CARE IF: You have persistent spotting of blood in your stool 2 3 days after the procedure. SEEK IMMEDIATE MEDICAL CARE IF:  You have more than a small spotting of blood in your stool.  You pass large blood clots in your stool.  Your abdomen is swollen  (distended).  You have nausea or vomiting.  You have a fever.  You have increasing abdominal pain that is not relieved with medicine. Document Released: 11/06/2003 Document Revised: 01/12/2013 Document Reviewed: 11/29/2012 [facility] Patient Information 2014 [employer], [place].  Myishia Delle ReiningL Husain  07/15/2013    Brief History and Findings:{NA AND GNFAOZHY:86578}WILDCARD:21589}.   Local Anesthetic:{NA AND IONGEXBM:84132}WILDCARD:21589}.   Procedure: COLONOSCOPY.   Specimen:{Specimen:14613}.   Condition of Patient Post Procedure:{Post Procedure Pat Cond:21585::"Tolerated procedure well"}.   Instructions    Activity:{plan; gen activity restrictions:13447}   Diet:{diet:18644}   Wound Care:{wound care:18263}   Follow-up:{follow up:32580} with Louis Meckelobert D Kaplan, MD.   Other{NA AND GMWNUUVO:53664}WILDCARD:21589}.   Analisia Delle ReiningL Wing  07/15/2013    Brief History and Findings:{NA AND QIHKVQQV:95638}WILDCARD:21589}.   Local Anesthetic:{NA AND VFIEPPIR:51884}WILDCARD:21589}.   Procedure: COLONOSCOPY.   Specimen:{Specimen:14613}.   Condition of Patient Post Procedure:{Post Procedure Pat Cond:21585::"Tolerated procedure well"}.   Instructions    Activity:{plan; gen activity restrictions:13447}   Diet:{diet:18644}   Wound Care:{wound care:18263}   Follow-up:{follow up:32580} with Louis Meckelobert D Kaplan, MD.   Other{NA AND ZYSAYTKZ:60109}WILDCARD:21589}.

## 2013-07-15 NOTE — H&P (View-Only) (Signed)
_                                                                                                                History of Present Illness: ERCP in 2007 was normal.  A plastic stent was placed because of a prior concern for a biliary leak.  This was subsequently removed.  Esophagitis, nonerosive, was seen in 2008.  As colonoscopy was 10 years ago.  Approximately 4 days ago she developed melenic stools.  There were soft and black.  This continued the following day.  She was seen by her PCP yesterday where she tested Hemoccult positive.  Hemoglobin was 10.7 which apparently was stable.  She was taking xarelto for atrial flutter which was discontinued.  She is on no gastric irritants including nonsteroidals.  There's been no change in bowel habits.    Past Medical History  Diagnosis Date  . Glaucoma     Severe   Past Surgical History  Procedure Laterality Date  . Gallbladder surgery    . Leg stent     family history includes Diabetes in her maternal uncle; Throat cancer in her maternal grandfather and mother. Current Outpatient Prescriptions  Medication Sig Dispense Refill  . amitriptyline (ELAVIL) 10 MG tablet       . ampicillin (PRINCIPEN) 500 MG capsule       . CRESTOR 5 MG tablet       . diltiazem (CARDIZEM CD) 120 MG 24 hr capsule       . LUMIGAN 0.01 % SOLN       . traMADol-acetaminophen (ULTRACET) 37.5-325 MG per tablet       . TRAVATAN Z 0.004 % SOLN ophthalmic solution        No current facility-administered medications for this visit.   Allergies as of 07/06/2013 - Review Complete 07/06/2013  Allergen Reaction Noted  . Codeine sulfate  05/08/2010    reports that she has quit smoking. She has never used smokeless tobacco. She reports that she drinks alcohol. She reports that she does not use illicit drugs.     Review of Systems: Pertinent positive and negative review of systems were noted in the above HPI section. All other review of systems were  otherwise negative.  Vital signs were reviewed in today's medical record Physical Exam: General: Well developed , well nourished, no acute distress Skin: anicteric Head: Normocephalic and atraumatic Eyes:  sclerae anicteric, EOMI Ears: Normal auditory acuity Mouth: No deformity or lesions Neck: Supple, no masses or thyromegaly Lungs: Clear throughout to auscultation Heart: Regular rate and rhythm; no murmurs, rubs or bruits Abdomen: Soft, non tender and non distended. No masses, hepatosplenomegaly or hernias noted. Normal Bowel sounds Rectal:deferred Musculoskeletal: Symmetrical with no gross deformities  Skin: No lesions on visible extremities Pulses:  Normal pulses noted Extremities: No clubbing, cyanosis, edema or deformities noted Neurological: Alert oriented x 4, grossly nonfocal Cervical Nodes:  No significant cervical adenopathy Inguinal Nodes: No significant inguinal adenopathy Psychological:  Alert and cooperative. Normal mood and  affect  See Assessment and Plan under Problem List

## 2013-07-18 ENCOUNTER — Encounter (HOSPITAL_COMMUNITY): Payer: Self-pay | Admitting: Gastroenterology

## 2013-07-19 ENCOUNTER — Other Ambulatory Visit: Payer: Self-pay

## 2013-07-19 DIAGNOSIS — D649 Anemia, unspecified: Secondary | ICD-10-CM

## 2013-08-17 ENCOUNTER — Other Ambulatory Visit (INDEPENDENT_AMBULATORY_CARE_PROVIDER_SITE_OTHER): Payer: Medicare Other

## 2013-08-17 DIAGNOSIS — D649 Anemia, unspecified: Secondary | ICD-10-CM

## 2013-08-17 LAB — HEMOCCULT SLIDES (X 3 CARDS)
Fecal Occult Blood: NEGATIVE
OCCULT 1: NEGATIVE
OCCULT 2: NEGATIVE
OCCULT 3: NEGATIVE
OCCULT 4: NEGATIVE
OCCULT 5: NEGATIVE

## 2013-08-18 ENCOUNTER — Ambulatory Visit: Payer: Medicare Other | Admitting: Gastroenterology

## 2013-08-19 ENCOUNTER — Encounter: Payer: Self-pay | Admitting: Gastroenterology

## 2013-08-19 NOTE — Progress Notes (Signed)
Quick Note:  Please inform the patient that Hemoccults were negative. No further GI workup ______ 

## 2013-11-04 ENCOUNTER — Encounter: Payer: Self-pay | Admitting: Gastroenterology

## 2014-03-14 ENCOUNTER — Other Ambulatory Visit (HOSPITAL_COMMUNITY): Payer: Self-pay | Admitting: Interventional Radiology

## 2014-03-14 DIAGNOSIS — I739 Peripheral vascular disease, unspecified: Secondary | ICD-10-CM

## 2014-04-20 ENCOUNTER — Other Ambulatory Visit: Payer: Self-pay | Admitting: Internal Medicine

## 2014-04-20 DIAGNOSIS — R0989 Other specified symptoms and signs involving the circulatory and respiratory systems: Secondary | ICD-10-CM

## 2014-05-18 ENCOUNTER — Ambulatory Visit
Admission: RE | Admit: 2014-05-18 | Discharge: 2014-05-18 | Disposition: A | Discharge status: Home / Self Care | Payer: Medicare Other | Source: Ambulatory Visit | Attending: Internal Medicine | Admitting: Internal Medicine

## 2014-05-18 ENCOUNTER — Ambulatory Visit
Admission: RE | Admit: 2014-05-18 | Discharge: 2014-05-18 | Disposition: A | Discharge status: Home / Self Care | Payer: Medicare Other | Source: Ambulatory Visit | Attending: Interventional Radiology | Admitting: Interventional Radiology

## 2014-05-18 DIAGNOSIS — I739 Peripheral vascular disease, unspecified: Secondary | ICD-10-CM

## 2014-05-18 DIAGNOSIS — R0989 Other specified symptoms and signs involving the circulatory and respiratory systems: Secondary | ICD-10-CM

## 2014-05-18 HISTORY — PX: IR GENERIC HISTORICAL: IMG1180011

## 2014-05-18 NOTE — Progress Notes (Signed)
Patient ID: [name], female   DOB: [birth year], 81 y.o.   MRN: [id number]    Chief Complaint: Chief Complaint  Patient presents with  . Follow-up    4 yr follow up Right SFA stent placement      Referring Physician(s): Green, ED  History of Present Illness: OREL HORD is a 81 y.o. female who presents for outpatient follow-up after undergoing right distal SFA stent placement 05/24/2010. She originally presented with progressive right leg claudication. Since intervention, she has done very well clinically. No recurrent claudication, leg pain or rest pain. She continues to walk and exercise daily. No physical limitations. Stable weight. She continues on a baby aspirin daily. No new complaints.  Past Medical History  Diagnosis Date  . Glaucoma     Severe  . PMR (polymyalgia rheumatica)   . Atrial flutter     NO CARDIOLOGIST FOLLOWED BY DR Tori Milks  . Bloody stools     LAST 4 DAYS  . Dysrhythmia   . High cholesterol     Past Surgical History  Procedure Laterality Date  . Gallbladder surgery    . Leg stent Right   . Laminectomy    . Esophagogastroduodenoscopy N/A 07/08/2013    Procedure: ESOPHAGOGASTRODUODENOSCOPY (EGD);  Surgeon: Louis Meckel, MD;  Location: [facility];  Service: Endoscopy;  Laterality: N/A;  Per Connye Burkitt, will try to have CRNA available, but will not guaranteee. Ok to schedule per Clydie Braun 07/06/13  . Shoulder surgery      frozen shoulder/ Bil shoulders  . Colonoscopy N/A 07/15/2013    Procedure: COLONOSCOPY;  Surgeon: Louis Meckel, MD;  Location: [facility];  Service: Endoscopy;  Laterality: N/A;    Allergies: Codeine sulfate  Medications: Prior to Admission medications   Medication Sig Start Date End Date Taking? Authorizing Provider  amitriptyline (ELAVIL) 10 MG tablet Take 10 mg by mouth at bedtime.  06/28/13  Yes Historical Provider, MD  aspirin 81 MG tablet Take 81 mg by mouth daily.   Yes Historical Provider, MD  atorvastatin  (LIPITOR) 10 MG tablet Take 10 mg by mouth daily.   Yes Historical Provider, MD  cholecalciferol (VITAMIN D) 1000 UNITS tablet Take 3,000 Units by mouth daily.   Yes Historical Provider, MD  diltiazem (CARDIZEM) 30 MG tablet Take 30 mg by mouth daily.   Yes Historical Provider, MD  traMADol-acetaminophen (ULTRACET) 37.5-325 MG per tablet Take 2 tablets by mouth at bedtime as needed for moderate pain.  06/01/13  Yes Historical Provider, MD  vitamin E 1000 UNIT capsule Take 2,000 Units by mouth daily.   Yes Historical Provider, MD  ampicillin (PRINCIPEN) 500 MG capsule Take 500 mg by mouth every other day. On Monday, Wednesday, and Fridays    Historical Provider, MD  LUMIGAN 0.01 % SOLN Place 1 drop into the left eye at bedtime.  06/27/13   Historical Provider, MD  Na Sulfate-K Sulfate-Mg Sulf (SUPREP BOWEL PREP) SOLN Suprep as directed / no substitutions 07/12/13   Louis Meckel, MD  rosuvastatin (CRESTOR) 5 MG tablet Take 5 mg by mouth daily.    Historical Provider, MD  TRAVATAN Z 0.004 % SOLN ophthalmic solution Place 1 drop into the right eye at bedtime.  06/01/13   Historical Provider, MD    Family History  Problem Relation Age of Onset  . Throat cancer Mother   . Throat cancer Maternal Grandfather   . Diabetes Maternal Uncle   . Cirrhosis Maternal Aunt  History   Social History  . Marital Status: Married    Spouse Name: N/A  . Number of Children: 4  . Years of Education: N/A   Occupational History  . Retired    Social History Main Topics  . Smoking status: Former Smoker    Types: Cigarettes    Quit date: 07/12/1993  . Smokeless tobacco: Never Used  . Alcohol Use: No  . Drug Use: No  . Sexual Activity: Not on file   Other Topics Concern  . Not on file   Social History Narrative    Review of Systems: A 12 point ROS discussed and pertinent positives are indicated in the HPI above.  All other systems are negative.  Review of Systems  Respiratory: Negative for cough,  chest tightness and shortness of breath.   Cardiovascular: Negative for chest pain, palpitations and leg swelling.  Gastrointestinal: Negative for abdominal distention.    Vital Signs: BP 198/78 mmHg  Pulse 79  Temp(Src) 97.6 F (36.4 C) (Oral)  Resp 14  SpO2 94%  Physical Exam  Constitutional: She appears well-developed and well-nourished. No distress.  Musculoskeletal:  Symmetric femoral, popliteal and dorsalis pedis pulses. Peripheral pulses remain palpable. No interval change.  Skin: She is not diaphoretic.    Imaging: Koreas Carotid Bilateral  05/18/2014   CLINICAL DATA:  Asymptomatic right carotid bruit, hyperlipidemia.  EXAM: BILATERAL CAROTID DUPLEX ULTRASOUND  TECHNIQUE: Wallace CullensGray scale imaging, color Doppler and duplex ultrasound were performed of bilateral carotid and vertebral arteries in the neck.  COMPARISON:  None.  FINDINGS: Criteria: Quantification of carotid stenosis is based on velocity parameters that correlate the residual internal carotid diameter with NASCET-based stenosis levels, using the diameter of the distal internal carotid lumen as the denominator for stenosis measurement.  The following velocity measurements were obtained:  RIGHT  ICA:  126/36 cm/sec  CCA:  79/18 cm/sec  SYSTOLIC ICA/CCA RATIO:  1.6  DIASTOLIC ICA/CCA RATIO:  2.0  ECA:  75 cm/sec  LEFT  ICA:  96/25 cm/sec  CCA:  68/16 cm/sec  SYSTOLIC ICA/CCA RATIO:  1.4  DIASTOLIC ICA/CCA RATIO:  1.6  ECA:  46 cm/sec  RIGHT CAROTID ARTERY: Minor echogenic shadowing plaque formation. No hemodynamically significant right ICA stenosis, velocity elevation, or turbulent flow. Degree of narrowing less than 50%.  RIGHT VERTEBRAL ARTERY:  Antegrade  LEFT CAROTID ARTERY: Similar scattered minor echogenic plaque formation. No hemodynamically significant left ICA stenosis, velocity elevation, or turbulent flow.  LEFT VERTEBRAL ARTERY:  Retrograde  IMPRESSION: Minor carotid atherosclerosis. No hemodynamically significant ICA  stenosis. Degree of narrowing less than 50% bilaterally.  Patent antegrade right vertebral flow.  Retrograde left vertebral flow evident. This is compatible with left subclavian steal syndrome.   Electronically Signed   By: Judie PetitM.  Valeriano Bain M.D.   On: 05/18/2014 15:56   [place]ore[place] Arterial Seg Multiple  05/18/2014   CLINICAL DATA:  Peripheral vascular disease, prior smoker, 4 year status post right SFA stent placement for a critical SFA stenosis and symptomatic right lower extremity claudication.  EXAM: NONINVASIVE PHYSIOLOGIC VASCULAR STUDY OF BILATERAL LOWER EXTREMITIES  TECHNIQUE: Evaluation of both lower extremities was performed at rest, including calculation of ankle-brachial indices, multiple segmental pressure evaluation, segmental Doppler and segmental pulse volume recording.  COMPARISON:  03/22/2013  FINDINGS: Right ABI:  0.92  Left ABI:  0.95  Right Lower Extremity: Stable triphasic right femoral, popliteal and dorsalis pedis tracings. Biphasic right tibial tracing as before. No significant waveform abnormality. Stable ABI. No evidence of recurrent right  SFA vascular disease.  Left Lower Extremity: Stable triphasic left femoral, popliteal and dorsalis pedis waveforms. Biphasic left posterior tibial tracing. Left ABI 0.95. No significant interval change.  IMPRESSION: Stable ABIs. No evidence of recurrent SFA vascular occlusive disease.   Electronically Signed   By: Judie Petit.  Aneka Fagerstrom M.D.   On: 05/18/2014 15:50    Assessment and Plan:  4 years status post right distal SFA stent for a critical systematic distal SFA stenosis and claudication. She continues to be asymptomatic and do very well clinically. No physical limitations or recurrent symptoms. Stable ABIs.  Continue annual outpatient follow-up with ABIs.  Continue daily aspirin for antiplatelet therapy. No new recommendations.  SignedBerdine Dance 05/18/2014, 4:14 PM   I spent a total of 30 minutes face to face in clinical consultation, greater than 50%  of which was counseling/coordinating care for the patient.

## 2015-06-19 ENCOUNTER — Other Ambulatory Visit (HOSPITAL_COMMUNITY): Payer: Self-pay | Admitting: Interventional Radiology

## 2015-06-19 DIAGNOSIS — I739 Peripheral vascular disease, unspecified: Secondary | ICD-10-CM

## 2015-07-03 ENCOUNTER — Encounter: Payer: Self-pay | Admitting: Radiology

## 2015-08-01 ENCOUNTER — Ambulatory Visit
Admission: RE | Admit: 2015-08-01 | Discharge: 2015-08-01 | Disposition: A | Discharge status: Home / Self Care | Payer: Medicare Other | Source: Ambulatory Visit | Attending: Interventional Radiology | Admitting: Interventional Radiology

## 2015-08-01 DIAGNOSIS — I739 Peripheral vascular disease, unspecified: Secondary | ICD-10-CM

## 2015-08-01 NOTE — Progress Notes (Signed)
Patient ID: [name], female   DOB: 1927/05/23, 82 y.o.   MRN: [id number]       Chief Complaint: Five-year follow-up for right SFA stent placement. Peripheral mass or disease. Claudication.  Referring Physician(s): Chilton Si, edwin  History of Present Illness: [name] is a 82 y.o. female who returns for outpatient follow-up after undergoing right distal SFA stent placement 05/24/2010. She initially presented with progressive right leg/calf claudication. She is now 5 years from the treatment. She is doing very well clinically. No recurrent claudication, leg pain or rest pain. She continues to walk and excised daily. No physical limitations. Stable weight. She remains on a baby aspirin daily. No new issues or complaints.  Past Medical History  Diagnosis Date  . Glaucoma     Severe  . PMR (polymyalgia rheumatica)   . Atrial flutter     NO CARDIOLOGIST FOLLOWED BY DR Tori Milks  . Bloody stools     LAST 4 DAYS  . Dysrhythmia   . High cholesterol     Past Surgical History  Procedure Laterality Date  . Gallbladder surgery    . Leg stent Right   . Laminectomy    . Esophagogastroduodenoscopy N/A 07/08/2013    Procedure: ESOPHAGOGASTRODUODENOSCOPY (EGD);  Surgeon: Louis Meckel, MD;  Location: [facility];  Service: Endoscopy;  Laterality: N/A;  Per Connye Burkitt, will try to have CRNA available, but will not guaranteee. Ok to schedule per Clydie Braun 07/06/13  . Shoulder surgery      frozen shoulder/ Bil shoulders  . Colonoscopy N/A 07/15/2013    Procedure: COLONOSCOPY;  Surgeon: Louis Meckel, MD;  Location: [facility];  Service: Endoscopy;  Laterality: N/A;    Allergies: Codeine sulfate  Medications: Prior to Admission medications   Medication Sig Start Date End Date Taking? Authorizing Provider  aspirin 81 MG tablet Take 81 mg by mouth daily.   Yes Historical Provider, MD  atorvastatin (LIPITOR) 10 MG tablet Take 10 mg by mouth daily.   Yes Historical Provider, MD    cholecalciferol (VITAMIN D) 1000 UNITS tablet Take 3,000 Units by mouth daily.   Yes Historical Provider, MD  diltiazem (CARDIZEM) 30 MG tablet Take 30 mg by mouth daily.   Yes Historical Provider, MD  LUMIGAN 0.01 % SOLN Place 1 drop into the left eye at bedtime.  06/27/13  Yes Historical Provider, MD  traMADol-acetaminophen (ULTRACET) 37.5-325 MG per tablet Take 2 tablets by mouth at bedtime as needed for moderate pain.  06/01/13  Yes Historical Provider, MD  TRAVATAN Z 0.004 % SOLN ophthalmic solution Place 1 drop into the right eye at bedtime.  06/01/13  Yes Historical Provider, MD  vitamin E 1000 UNIT capsule Take 2,000 Units by mouth daily.   Yes Historical Provider, MD  amitriptyline (ELAVIL) 10 MG tablet Take 10 mg by mouth at bedtime. Reported on 08/01/2015 06/28/13   Historical Provider, MD  ampicillin (PRINCIPEN) 500 MG capsule Take 500 mg by mouth every other day. On Monday, Wednesday, and Fridays    Historical Provider, MD  Na Sulfate-K Sulfate-Mg Sulf (SUPREP BOWEL PREP) SOLN Suprep as directed / no substitutions Patient not taking: Reported on 08/01/2015 07/12/13   Louis Meckel, MD  rosuvastatin (CRESTOR) 5 MG tablet Take 5 mg by mouth daily. Reported on 08/01/2015    Historical Provider, MD     Family History  Problem Relation Age of Onset  . Throat cancer Mother   . Throat cancer Maternal Grandfather   . Diabetes  Maternal Uncle   . Cirrhosis Maternal Aunt     Social History   Social History  . Marital Status: Married    Spouse Name: N/A  . Number of Children: 4  . Years of Education: N/A   Occupational History  . Retired    Social History Main Topics  . Smoking status: Former Smoker    Types: Cigarettes    Quit date: 07/12/1993  . Smokeless tobacco: Never Used  . Alcohol Use: No  . Drug Use: No  . Sexual Activity: Not on file   Other Topics Concern  . Not on file   Social History Narrative    Review of Systems: A 12 point ROS discussed and pertinent  positives are indicated in the HPI above.  All other systems are negative.  Review of Systems  Vital Signs: BP 189/83 mmHg  Pulse 61  Temp(Src) 97 F (36.1 C) (Oral)  Resp 14  SpO2 98%  Physical Exam  Constitutional:  Thin elderly female in no distress.  Musculoskeletal: Normal range of motion. She exhibits no edema or tenderness.  Normal symmetric femoral, popliteal, and dorsalis pedis pulses. Peripheral pulses remain palpable. No interval change. No signs of ischemia. No skin lesions.    Imaging: [place] Arterial Seg Multiple  08/01/2015  CLINICAL DATA:  Peripheral vascular disease, 5 years status post right distal SFA stent placement for critical SFA stenosis and symptomatic right lower extremity claudication. EXAM: NONINVASIVE PHYSIOLOGIC VASCULAR STUDY OF BILATERAL LOWER EXTREMITIES TECHNIQUE: Evaluation of both lower extremities was performed at rest, including calculation of ankle-brachial indices, multiple segmental pressure evaluation, segmental Doppler and segmental pulse volume recording. COMPARISON:  05/18/2014 FINDINGS: Right ABI:  1.05 Left ABI:  1.00 Right Lower Extremity: Triphasic right femoral, popliteal, posterior tibial and dorsalis pedis tracings. No pressure gradient. Normal pulse volume recordings. Right toe pressure 83. Left Lower Extremity: Symmetric triphasic left femoral, popliteal, posterior tibial and dorsalis pedis waveforms. Symmetric pulse volume recordings. Left toe pressure 64. IMPRESSION: Normal ABIs at rest.  Stable exam. Electronically Signed   By: Judie PetitM.  Kip Kautzman M.D.   On: 08/01/2015 11:19     Assessment and Plan:  5 years status post distal right SFA stent for a critical systematic SFA stenosis resulting in systematic claudication. She continues to be asymptomatic and doing very well clinically. No physical limitations. No recurrent symptoms. Normal ABIs 5 years posttreatment.  Continue daily aspirin for antiplatelet therapy. No new recommendations. Follow-up  as needed for any recurrent symptoms. She is in agreement with this plan.  Electronically Signed: Berdine DanceSHICK, Grissel Tyrell 08/01/2015, 11:33 AM   I spent a total of    25 Minutes in face to face in clinical consultation, greater than 50% of which was counseling/coordinating care for this patient with PVD

## 2016-03-25 ENCOUNTER — Other Ambulatory Visit: Payer: Self-pay | Admitting: Internal Medicine

## 2016-03-25 DIAGNOSIS — E041 Nontoxic single thyroid nodule: Secondary | ICD-10-CM

## 2016-03-27 ENCOUNTER — Ambulatory Visit
Admission: RE | Admit: 2016-03-27 | Discharge: 2016-03-27 | Disposition: A | Discharge status: Home / Self Care | Payer: Medicare Other | Source: Ambulatory Visit | Attending: Internal Medicine | Admitting: Internal Medicine

## 2016-03-27 DIAGNOSIS — E041 Nontoxic single thyroid nodule: Secondary | ICD-10-CM

## 2016-04-09 ENCOUNTER — Encounter: Payer: Self-pay | Admitting: Interventional Radiology

## 2016-04-14 ENCOUNTER — Inpatient Hospital Stay (HOSPITAL_COMMUNITY)
Admission: EM | Admit: 2016-04-14 | Discharge: 2016-04-18 | DRG: 193 | Disposition: A | Discharge status: Skilled Nursing Facility | Payer: Medicare Other | Attending: Internal Medicine | Admitting: Internal Medicine

## 2016-04-14 ENCOUNTER — Encounter (HOSPITAL_COMMUNITY): Payer: Self-pay | Admitting: Emergency Medicine

## 2016-04-14 ENCOUNTER — Emergency Department (HOSPITAL_COMMUNITY): Payer: Medicare Other

## 2016-04-14 DIAGNOSIS — J440 Chronic obstructive pulmonary disease with acute lower respiratory infection: Secondary | ICD-10-CM | POA: Diagnosis present

## 2016-04-14 DIAGNOSIS — I480 Paroxysmal atrial fibrillation: Secondary | ICD-10-CM | POA: Diagnosis present

## 2016-04-14 DIAGNOSIS — Z87891 Personal history of nicotine dependence: Secondary | ICD-10-CM

## 2016-04-14 DIAGNOSIS — N183 Chronic kidney disease, stage 3 (moderate): Secondary | ICD-10-CM | POA: Diagnosis present

## 2016-04-14 DIAGNOSIS — M129 Arthropathy, unspecified: Secondary | ICD-10-CM | POA: Diagnosis not present

## 2016-04-14 DIAGNOSIS — I4892 Unspecified atrial flutter: Secondary | ICD-10-CM | POA: Diagnosis present

## 2016-04-14 DIAGNOSIS — Z7901 Long term (current) use of anticoagulants: Secondary | ICD-10-CM

## 2016-04-14 DIAGNOSIS — Z7982 Long term (current) use of aspirin: Secondary | ICD-10-CM

## 2016-04-14 DIAGNOSIS — E7800 Pure hypercholesterolemia, unspecified: Secondary | ICD-10-CM | POA: Diagnosis present

## 2016-04-14 DIAGNOSIS — J9601 Acute respiratory failure with hypoxia: Secondary | ICD-10-CM | POA: Diagnosis present

## 2016-04-14 DIAGNOSIS — I5033 Acute on chronic diastolic (congestive) heart failure: Secondary | ICD-10-CM | POA: Diagnosis present

## 2016-04-14 DIAGNOSIS — J189 Pneumonia, unspecified organism: Secondary | ICD-10-CM | POA: Diagnosis not present

## 2016-04-14 DIAGNOSIS — Z681 Body mass index (BMI) 19 or less, adult: Secondary | ICD-10-CM

## 2016-04-14 DIAGNOSIS — J441 Chronic obstructive pulmonary disease with (acute) exacerbation: Secondary | ICD-10-CM | POA: Diagnosis present

## 2016-04-14 DIAGNOSIS — E43 Unspecified severe protein-calorie malnutrition: Secondary | ICD-10-CM | POA: Diagnosis present

## 2016-04-14 DIAGNOSIS — R0902 Hypoxemia: Secondary | ICD-10-CM | POA: Diagnosis present

## 2016-04-14 DIAGNOSIS — H409 Unspecified glaucoma: Secondary | ICD-10-CM | POA: Diagnosis present

## 2016-04-14 DIAGNOSIS — I517 Cardiomegaly: Secondary | ICD-10-CM | POA: Diagnosis present

## 2016-04-14 DIAGNOSIS — R0602 Shortness of breath: Secondary | ICD-10-CM

## 2016-04-14 DIAGNOSIS — M353 Polymyalgia rheumatica: Secondary | ICD-10-CM | POA: Diagnosis present

## 2016-04-14 DIAGNOSIS — E785 Hyperlipidemia, unspecified: Secondary | ICD-10-CM | POA: Diagnosis present

## 2016-04-14 DIAGNOSIS — M1990 Unspecified osteoarthritis, unspecified site: Secondary | ICD-10-CM | POA: Diagnosis present

## 2016-04-14 DIAGNOSIS — Z885 Allergy status to narcotic agent status: Secondary | ICD-10-CM

## 2016-04-14 LAB — CBC WITH DIFFERENTIAL/PLATELET
Basophils Absolute: 0 10*3/uL (ref 0.0–0.1)
Basophils Relative: 0 %
Eosinophils Absolute: 0.4 10*3/uL (ref 0.0–0.7)
Eosinophils Relative: 3 %
HCT: 37.7 % (ref 36.0–46.0)
Hemoglobin: 12.8 g/dL (ref 12.0–15.0)
Lymphocytes Relative: 8 %
Lymphs Abs: 1 10*3/uL (ref 0.7–4.0)
MCH: 31.6 pg (ref 26.0–34.0)
MCHC: 34 g/dL (ref 30.0–36.0)
MCV: 93.1 fL (ref 78.0–100.0)
Monocytes Absolute: 2 10*3/uL — ABNORMAL HIGH (ref 0.1–1.0)
Monocytes Relative: 15 %
Neutro Abs: 9.8 10*3/uL — ABNORMAL HIGH (ref 1.7–7.7)
Neutrophils Relative %: 74 %
Platelets: 317 10*3/uL (ref 150–400)
RBC: 4.05 MIL/uL (ref 3.87–5.11)
RDW: 15 % (ref 11.5–15.5)
WBC: 13.2 10*3/uL — ABNORMAL HIGH (ref 4.0–10.5)

## 2016-04-14 LAB — URINALYSIS, ROUTINE W REFLEX MICROSCOPIC
Bilirubin Urine: NEGATIVE
Glucose, UA: NEGATIVE mg/dL
Hgb urine dipstick: NEGATIVE
Ketones, ur: NEGATIVE mg/dL
Leukocytes, UA: NEGATIVE
Nitrite: NEGATIVE
Protein, ur: NEGATIVE mg/dL
Specific Gravity, Urine: 1.003 — ABNORMAL LOW (ref 1.005–1.030)
pH: 6 (ref 5.0–8.0)

## 2016-04-14 LAB — BASIC METABOLIC PANEL
Anion gap: 10 (ref 5–15)
BUN: 27 mg/dL — ABNORMAL HIGH (ref 6–20)
CO2: 22 mmol/L (ref 22–32)
Calcium: 8.9 mg/dL (ref 8.9–10.3)
Chloride: 104 mmol/L (ref 101–111)
Creatinine, Ser: 1.55 mg/dL — ABNORMAL HIGH (ref 0.44–1.00)
GFR calc Af Amer: 34 mL/min — ABNORMAL LOW (ref >60)
GFR calc non Af Amer: 29 mL/min — ABNORMAL LOW (ref >60)
Glucose, Bld: 95 mg/dL (ref 65–99)
Potassium: 3.8 mmol/L (ref 3.5–5.1)
Sodium: 136 mmol/L (ref 135–145)

## 2016-04-14 LAB — INFLUENZA PANEL BY PCR (TYPE A & B)
Influenza A By PCR: NEGATIVE
Influenza B By PCR: NEGATIVE

## 2016-04-14 MED ORDER — SODIUM CHLORIDE 0.9% FLUSH
3.0000 mL | Freq: Two times a day (BID) | INTRAVENOUS | Status: DC
  Administered 2016-04-16 – 2016-04-18 (×5): 3 mL via INTRAVENOUS

## 2016-04-14 MED ORDER — TRAMADOL-ACETAMINOPHEN 37.5-325 MG PO TABS
1.0000 | ORAL_TABLET | Freq: Two times a day (BID) | ORAL | Status: DC | PRN
  Administered 2016-04-15 – 2016-04-16 (×2): 2 via ORAL
  Filled 2016-04-14 (×2): qty 2

## 2016-04-14 MED ORDER — ASPIRIN EC 81 MG PO TBEC
81.0000 mg | DELAYED_RELEASE_TABLET | Freq: Every day | ORAL | Status: DC
  Administered 2016-04-14 – 2016-04-18 (×5): 81 mg via ORAL
  Filled 2016-04-14 (×5): qty 1

## 2016-04-14 MED ORDER — SODIUM CHLORIDE 0.9 % IV BOLUS (SEPSIS)
1000.0000 mL | Freq: Once | INTRAVENOUS | Status: AC
  Administered 2016-04-14: 1000 mL via INTRAVENOUS

## 2016-04-14 MED ORDER — PREDNISONE 5 MG PO TABS
5.0000 mg | ORAL_TABLET | Freq: Every day | ORAL | Status: DC
  Administered 2016-04-15 – 2016-04-16 (×2): 5 mg via ORAL
  Filled 2016-04-14 (×2): qty 1

## 2016-04-14 MED ORDER — APIXABAN 2.5 MG PO TABS
2.5000 mg | ORAL_TABLET | Freq: Two times a day (BID) | ORAL | Status: DC
  Administered 2016-04-14 – 2016-04-18 (×8): 2.5 mg via ORAL
  Filled 2016-04-14 (×8): qty 1

## 2016-04-14 MED ORDER — ACETAMINOPHEN 650 MG RE SUPP: 650.0000 mg | Freq: Four times a day (QID) | RECTAL | Status: DC | PRN

## 2016-04-14 MED ORDER — VITAMIN D3 25 MCG (1000 UNIT) PO TABS
3000.0000 [IU] | ORAL_TABLET | Freq: Every day | ORAL | Status: DC
  Administered 2016-04-14 – 2016-04-18 (×5): 3000 [IU] via ORAL
  Filled 2016-04-14 (×5): qty 3

## 2016-04-14 MED ORDER — NAPROXEN SODIUM 220 MG PO TABS: 220.0000 mg | ORAL_TABLET | Freq: Every day | ORAL | Status: DC | PRN

## 2016-04-14 MED ORDER — IPRATROPIUM-ALBUTEROL 0.5-2.5 (3) MG/3ML IN SOLN
3.0000 mL | Freq: Four times a day (QID) | RESPIRATORY_TRACT | Status: DC | PRN
  Administered 2016-04-14: 3 mL via RESPIRATORY_TRACT
  Filled 2016-04-14: qty 3

## 2016-04-14 MED ORDER — SODIUM CHLORIDE 0.9 % IV SOLN
INTRAVENOUS | Status: DC
  Administered 2016-04-14: 75 mL/h via INTRAVENOUS
  Administered 2016-04-15 – 2016-04-16 (×3): via INTRAVENOUS

## 2016-04-14 MED ORDER — ATORVASTATIN CALCIUM 10 MG PO TABS
5.0000 mg | ORAL_TABLET | Freq: Every day | ORAL | Status: DC
  Administered 2016-04-14 – 2016-04-18 (×5): 5 mg via ORAL
  Filled 2016-04-14 (×5): qty 1

## 2016-04-14 MED ORDER — DILTIAZEM HCL ER COATED BEADS 120 MG PO CP24
120.0000 mg | ORAL_CAPSULE | Freq: Two times a day (BID) | ORAL | Status: DC
  Administered 2016-04-14 – 2016-04-18 (×8): 120 mg via ORAL
  Filled 2016-04-14 (×10): qty 1

## 2016-04-14 MED ORDER — LATANOPROST 0.005 % OP SOLN
1.0000 [drp] | Freq: Every day | OPHTHALMIC | Status: DC
  Administered 2016-04-14 – 2016-04-17 (×4): 1 [drp] via OPHTHALMIC
  Filled 2016-04-14: qty 2.5

## 2016-04-14 MED ORDER — LATANOPROST 0.005 % OP SOLN: 1.0000 [drp] | Freq: Every day | OPHTHALMIC | Status: DC

## 2016-04-14 MED ORDER — ACETAMINOPHEN 325 MG PO TABS
650.0000 mg | ORAL_TABLET | Freq: Four times a day (QID) | ORAL | Status: DC | PRN
  Administered 2016-04-16: 650 mg via ORAL
  Filled 2016-04-14: qty 2

## 2016-04-14 NOTE — ED Triage Notes (Signed)
Patient reports headache, body aches, and chills since this morning. Patient husband reports 2 "epsiodes" of the same over the past week. Denies abdominal pain, N/V/D, chest pain, blurred vision and SOB. Patient reports taking 325mg  Tylenol this morning.

## 2016-04-14 NOTE — H&P (Signed)
History and Physical    [name]:[id number] DOB: 1927-05-25 DOA: 04/14/2016  PCP: Enrique Sack, MD  Patient coming from: Home  Chief Complaint: SOB on ambulation  HPI: [name] is a 83 y.o. female with medical history significant of afib on chronic anticoagulation who presents with two weeks of worsening malaise, muscle aches, "flu type symptoms" that got notably worse 2-3 days prior to hospital admission associated with subjective fevers. Patient subsequently presented to the ED for further work up. Family present in room reports concerns of rapid heart rate.   ED Course: In the ED, patient was noted to be rate controlled. Patient noted to be afebrile with mild leukocytosis of 13k. Patient During evaluation, patient was found to be hypoxic with O2 sats of 81% on RA, returning to mid-90's at rest. CXR was found to be unremarkable. Hospitalist service consulted for consideration for admission.  Review of Systems:  Review of Systems  Constitutional: Positive for fever and malaise/fatigue.  HENT: Negative for congestion, ear discharge and nosebleeds.   Eyes: Negative for blurred vision, double vision and photophobia.  Respiratory: Positive for shortness of breath. Negative for hemoptysis and sputum production.   Cardiovascular: Negative for chest pain, palpitations and claudication.  Gastrointestinal: Negative for abdominal pain, nausea and vomiting.  Musculoskeletal: Negative for back pain, falls and joint pain.  Neurological: Positive for weakness. Negative for tremors, sensory change and loss of consciousness.  Psychiatric/Behavioral: Negative for hallucinations and memory loss. The patient is not nervous/anxious.     Past Medical History:  Diagnosis Date  . Atrial flutter (HCC)    NO CARDIOLOGIST FOLLOWED BY DR Tori Milks  . Bloody stools    LAST 4 DAYS  . Dysrhythmia   . Glaucoma    Severe  . High cholesterol   . PMR (polymyalgia rheumatica) (HCC)      Past Surgical History:  Procedure Laterality Date  . COLONOSCOPY N/A 07/15/2013   Procedure: COLONOSCOPY;  Surgeon: Louis Meckel, MD;  Location: [facility];  Service: Endoscopy;  Laterality: N/A;  . ESOPHAGOGASTRODUODENOSCOPY N/A 07/08/2013   Procedure: ESOPHAGOGASTRODUODENOSCOPY (EGD);  Surgeon: Louis Meckel, MD;  Location: [facility];  Service: Endoscopy;  Laterality: N/A;  Per Connye Burkitt, will try to have CRNA available, but will not guaranteee. Ok to schedule per Clydie Braun 07/06/13  . GALLBLADDER SURGERY    . IR GENERIC HISTORICAL  05/18/2014   IR RADIOLOGIST EVAL & MGMT 05/18/2014 Berdine Dance, MD [facility] INTERV RAD  . LAMINECTOMY    . leg stent Right   . SHOULDER SURGERY     frozen shoulder/ Bil shoulders     reports that she quit smoking about 22 years ago. Her smoking use included Cigarettes. She has never used smokeless tobacco. She reports that she does not drink alcohol or use drugs.  Allergies  Allergen Reactions  . Codeine Sulfate Nausea And Vomiting and Other (See Comments)    headache    Family History  Problem Relation Age of Onset  . Throat cancer Mother   . Diabetes Maternal Uncle   . Throat cancer Maternal Grandfather   . Cirrhosis Maternal Aunt     Prior to Admission medications   Medication Sig Start Date End Date Taking? Authorizing Provider  aspirin 81 MG tablet Take 81 mg by mouth daily.   Yes Historical Provider, MD  atorvastatin (LIPITOR) 10 MG tablet Take 5 mg by mouth daily.    Yes Historical Provider, MD  cholecalciferol (VITAMIN D) 1000 UNITS  tablet Take 3,000 Units by mouth daily.   Yes Historical Provider, MD  diltiazem (CARDIZEM CD) 120 MG 24 hr capsule Take 120 mg by mouth 2 (two) times daily. 03/26/16  Yes Historical Provider, MD  ELIQUIS 2.5 MG TABS tablet Take 2.5 mg by mouth 2 (two) times daily. 04/11/16  Yes Historical Provider, MD  LUMIGAN 0.01 % SOLN Place 1 drop into the left eye at bedtime.  06/27/13  Yes Historical Provider, MD   naproxen sodium (ANAPROX) 220 MG tablet Take 220 mg by mouth daily as needed for pain.   Yes Historical Provider, MD  predniSONE (DELTASONE) 10 MG tablet Take 5 mg by mouth daily. 04/03/16  Yes Historical Provider, MD  traMADol-acetaminophen (ULTRACET) 37.5-325 MG per tablet Take 1-2 tablets by mouth 2 (two) times daily as needed for moderate pain.  06/01/13  Yes Historical Provider, MD  TRAVATAN Z 0.004 % SOLN ophthalmic solution Place 1 drop into both eyes at bedtime.  06/01/13  Yes Historical Provider, MD  vitamin E 1000 UNIT capsule Take 2,000 Units by mouth daily.   Yes Historical Provider, MD  Na Sulfate-K Sulfate-Mg Sulf (SUPREP BOWEL PREP) SOLN Suprep as directed / no substitutions Patient not taking: Reported on 04/14/2016 07/12/13   Louis Meckelobert D Kaplan, MD    Physical Exam: Vitals:   04/14/16 1407 04/14/16 1430 04/14/16 1500 04/14/16 1600  BP: 116/76 124/91 130/78 136/80  Pulse: 83 91 74 70  Resp: 18 24 17 26   Temp:      TempSrc:      SpO2: 97% 93% 96% 97%  Weight:      Height:        Constitutional: NAD, calm, comfortable Vitals:   04/14/16 1407 04/14/16 1430 04/14/16 1500 04/14/16 1600  BP: 116/76 124/91 130/78 136/80  Pulse: 83 91 74 70  Resp: 18 24 17 26   Temp:      TempSrc:      SpO2: 97% 93% 96% 97%  Weight:      Height:       Eyes: PERRL, lids and conjunctivae normal ENMT: Mucous membranes are moist. Posterior pharynx clear of any exudate or lesions.Normal dentition.  Neck: normal, supple, no masses, no thyromegaly Respiratory: clear to auscultation bilaterally, no wheezing, no crackles. Normal respiratory effort. No accessory muscle use.  Cardiovascular: Regular rate and rhythm Abdomen: no tenderness, no masses palpated. No hepatosplenomegaly. Bowel sounds positive.  Musculoskeletal: no clubbing / cyanosis. No joint deformity upper and lower extremities. Good ROM, no contractures. Normal muscle tone.  Skin: no rashes, lesions, ulcers. No induration Neurologic: CN  2-12 grossly intact. Sensation intact, DTR normal. Strength 5/5 in all 4.  Psychiatric: Normal judgment and insight. Alert and oriented x 3. Normal mood.    Labs on Admission: I have personally reviewed following labs and imaging studies  CBC:  Recent Labs Lab 04/14/16 1212  WBC 13.2*  NEUTROABS 9.8*  HGB 12.8  HCT 37.7  MCV 93.1  PLT 317   Basic Metabolic Panel:  Recent Labs Lab 04/14/16 1212  NA 136  K 3.8  CL 104  CO2 22  GLUCOSE 95  BUN 27*  CREATININE 1.55*  CALCIUM 8.9   GFR: Estimated Creatinine Clearance: 20.2 mL/min (by C-G formula based on SCr of 1.55 mg/dL (H)). Liver Function Tests: No results for input(s): AST, ALT, ALKPHOS, BILITOT, PROT, ALBUMIN in the last 168 hours. No results for input(s): LIPASE, AMYLASE in the last 168 hours. No results for input(s): AMMONIA in the last 168 hours.  Coagulation Profile: No results for input(s): INR, PROTIME in the last 168 hours. Cardiac Enzymes: No results for input(s): CKTOTAL, CKMB, CKMBINDEX, TROPONINI in the last 168 hours. BNP (last 3 results) No results for input(s): PROBNP in the last 8760 hours. HbA1C: No results for input(s): HGBA1C in the last 72 hours. CBG: No results for input(s): GLUCAP in the last 168 hours. Lipid Profile: No results for input(s): CHOL, HDL, LDLCALC, TRIG, CHOLHDL, LDLDIRECT in the last 72 hours. Thyroid Function Tests: No results for input(s): TSH, T4TOTAL, FREET4, T3FREE, THYROIDAB in the last 72 hours. Anemia Panel: No results for input(s): VITAMINB12, FOLATE, FERRITIN, TIBC, IRON, RETICCTPCT in the last 72 hours. Urine analysis:    Component Value Date/Time   COLORURINE STRAW (A) 04/14/2016 1345   APPEARANCEUR CLEAR 04/14/2016 1345   LABSPEC 1.003 (L) 04/14/2016 1345   PHURINE 6.0 04/14/2016 1345   GLUCOSEU NEGATIVE 04/14/2016 1345   HGBUR NEGATIVE 04/14/2016 1345   BILIRUBINUR NEGATIVE 04/14/2016 1345   KETONESUR NEGATIVE 04/14/2016 1345   PROTEINUR NEGATIVE  04/14/2016 1345   NITRITE NEGATIVE 04/14/2016 1345   LEUKOCYTESUR NEGATIVE 04/14/2016 1345   Sepsis Labs: !!!!!!!!!!!!!!!!!!!!!!!!!!!!!!!!!!!!!!!!!!!! @LABRCNTIP (procalcitonin:4,lacticidven:4) )No results found for this or any previous visit (from the past 240 hour(s)).   Radiological Exams on Admission: Dg Chest 2 View  Result Date: 04/14/2016 CLINICAL DATA:  Pt states headache, body aches and chills since last night, husband states 2 episodes of same over last week, nonsmoker, no other chest complaints EXAM: CHEST  2 VIEW COMPARISON:  12/24/2015 FINDINGS: The lungs are hyperinflated likely secondary to COPD. There is no focal parenchymal opacity. There is no pleural effusion or pneumothorax. The heart and mediastinal contours are unremarkable. The osseous structures are unremarkable. IMPRESSION: No active cardiopulmonary disease. Electronically Signed   By: Elige Ko   On: 04/14/2016 12:07    EKG: Independently reviewed. NSR  Assessment/Plan Principal Problem:   Hypoxia Active Problems:   Arthropathy   Paroxysmal a-fib (HCC)   Chronic anticoagulation   1. Hypoxia on ambulation 1. Presently with O2 sats within normal limits 2. No active wheezing on auscultation 3. Will continue with PRN duonebs 4. Continue O2 as needed, wean as tolerated 5. Will continue daily prednisone per home med rec 6. Monitor in obs status, tele 2. Hx paroxysmal afib 1. Rate controlled at present 2. Will continue to monitor on tele 3. Family requests follow up with Cardiology. Will ask Cardiology to establish with patient after discharge. 4. If acute cardiac issues arise, then would formally consult cardiology in hospital 5. Will continue eliquis per home regimen 3. Chronic anticoagulation 1. Will continue therapeutic anticoagulation per above 2. No signs of bleeding at this time 4. Arthitis 1. Stable 2. Cont supportive care  DVT prophylaxis: Eliquis  Code Status: Full Family Communication: Pt  in room, family at bedside  Disposition Plan: Possible d/c home in 24-48hrs  Consults called:  Admission status: Obs, tele   Keaunna Skipper, Scheryl Marten MD [facility] Pager [phone]  If 7PM-7AM, please contact night-coverage [email or web address] Password [email or web address]  04/14/2016, 4:16 PM

## 2016-04-14 NOTE — Progress Notes (Signed)
Assisted patient to the bathroom.  While there, patient became short of breath with audible wheezing.  Took her back to bed and applied O2.  O2 sat went up to 90% and patient began breathing a little easier and without audible wheezing.  Notified RN

## 2016-04-14 NOTE — ED Provider Notes (Signed)
  Face-to-face evaluation   History: She presents for concern of irregular heartbeat, and shortness of breath. She has been hypoxic here. She is unable to identify when the shortness of breath occurs.  Physical exam: Alert, elderly female who appears comfortable, on oxygen. Heart-somewhat irregular, correlates with PVCs on monitor. Lungs clear anteriorly. No focal asymmetry of strength.    Medical screening examination/treatment/procedure(s) were conducted as a shared visit with non-physician practitioner(s) and myself.  I personally evaluated the patient during the encounter   Mancel BaleElliott Tiari Andringa, MD 04/15/16 1818

## 2016-04-14 NOTE — ED Provider Notes (Signed)
[facility]-EMERGENCY DEPT Provider Note   CSN: [id number] Arrival date & time: 04/14/16  1019   History   Chief Complaint Chief Complaint  Patient presents with  . Generalized Body Aches  . Headache    HPI [name] is a 83 y.o. female.  HPI   83 year old female presents today with generalized fatigue. She reports that approximately 2 weeks ago she had sudden onset  body aches, fever to 101. She denies any other specific complaints including upper respiratory, abdominal, urinary. She notes she felt slightly better, but symptoms have been waxing and waning since then. This morning she felt extreme fatigue, headache, and feverish. At home temperature showed no objective fever. She has been seen by her primary care several times, with likely viral-type illness. No prior imaging studies.  Presently patient reports she's area fatigued, she denies any neck stiffness, cough, shortness of breath, abdominal pain or diarrhea, rash, urinary complaints, denies rash.  Husband also notes that he would like recommend spotted fever checked as he is a retired M.D. and they have a cabin that is" tick [employer]". Patient denies any recent tick exposure or rash.  Husband notes pt has been SOB with exertion. Daughter notes that patient has a history of atrial flutter, currently being followed by primary care for this. They note that approximately 2 years ago she was in atrial fibrillation and cardioverted back into atrial flutter. She notes she has been on eliquis for this. They note approximate 2 days ago she appeared to be in atrial fibrillation.   Past Medical History:  Diagnosis Date  . Atrial flutter (HCC)    NO CARDIOLOGIST FOLLOWED BY DR Tori Milks  . Bloody stools    LAST 4 DAYS  . Dysrhythmia   . Glaucoma    Severe  . High cholesterol   . PMR (polymyalgia rheumatica) [facility])     Patient Active Problem List   Diagnosis Date Noted  . Diverticulosis of colon (without mention of hemorrhage)  07/15/2013  . Blood in stool 07/06/2013  . ARTHRITIS 04/06/2007  . ABDOMINAL PAIN, RIGHT UPPER QUADRANT 04/06/2007  . ABDOMINAL PAIN, EPIGASTRIC 04/06/2007  . ESOPHAGITIS 03/29/2007    Past Surgical History:  Procedure Laterality Date  . COLONOSCOPY N/A 07/15/2013   Procedure: COLONOSCOPY;  Surgeon: Louis Meckel, MD;  Location: [facility];  Service: Endoscopy;  Laterality: N/A;  . ESOPHAGOGASTRODUODENOSCOPY N/A 07/08/2013   Procedure: ESOPHAGOGASTRODUODENOSCOPY (EGD);  Surgeon: Louis Meckel, MD;  Location: [facility];  Service: Endoscopy;  Laterality: N/A;  Per Connye Burkitt, will try to have CRNA available, but will not guaranteee. Ok to schedule per Clydie Braun 07/06/13  . GALLBLADDER SURGERY    . IR GENERIC HISTORICAL  05/18/2014   IR RADIOLOGIST EVAL & MGMT 05/18/2014 Berdine Dance, MD [facility] INTERV RAD  . LAMINECTOMY    . leg stent Right   . SHOULDER SURGERY     frozen shoulder/ Bil shoulders    OB History    No data available       Home Medications    Prior to Admission medications   Medication Sig Start Date End Date Taking? Authorizing Provider  aspirin 81 MG tablet Take 81 mg by mouth daily.   Yes Historical Provider, MD  atorvastatin (LIPITOR) 10 MG tablet Take 5 mg by mouth daily.    Yes Historical Provider, MD  cholecalciferol (VITAMIN D) 1000 UNITS tablet Take 3,000 Units by mouth daily.   Yes Historical Provider, MD  diltiazem (CARDIZEM CD) 120 MG 24  hr capsule Take 120 mg by mouth 2 (two) times daily. 03/26/16  Yes Historical Provider, MD  ELIQUIS 2.5 MG TABS tablet Take 2.5 mg by mouth 2 (two) times daily. 04/11/16  Yes Historical Provider, MD  LUMIGAN 0.01 % SOLN Place 1 drop into the left eye at bedtime.  06/27/13  Yes Historical Provider, MD  naproxen sodium (ANAPROX) 220 MG tablet Take 220 mg by mouth daily as needed for pain.   Yes Historical Provider, MD  predniSONE (DELTASONE) 10 MG tablet Take 5 mg by mouth daily. 04/03/16  Yes Historical Provider, MD    traMADol-acetaminophen (ULTRACET) 37.5-325 MG per tablet Take 1-2 tablets by mouth 2 (two) times daily as needed for moderate pain.  06/01/13  Yes Historical Provider, MD  TRAVATAN Z 0.004 % SOLN ophthalmic solution Place 1 drop into both eyes at bedtime.  06/01/13  Yes Historical Provider, MD  vitamin E 1000 UNIT capsule Take 2,000 Units by mouth daily.   Yes Historical Provider, MD  Na Sulfate-K Sulfate-Mg Sulf (SUPREP BOWEL PREP) SOLN Suprep as directed / no substitutions Patient not taking: Reported on 04/14/2016 07/12/13   Louis Meckel, MD    Family History Family History  Problem Relation Age of Onset  . Throat cancer Mother   . Diabetes Maternal Uncle   . Throat cancer Maternal Grandfather   . Cirrhosis Maternal Aunt     Social History Social History  Substance Use Topics  . Smoking status: Former Smoker    Types: Cigarettes    Quit date: 07/12/1993  . Smokeless tobacco: Never Used  . Alcohol use No     Allergies   Codeine sulfate   Review of Systems Review of Systems  All other systems reviewed and are negative.   Physical Exam Updated Vital Signs BP 130/78   Pulse 74   Temp 99.6 F (37.6 C) (Rectal)   Resp 17   Ht 5\' 3"  (1.6 m)   Wt 49 kg   SpO2 96%   BMI 19.13 kg/m   Physical Exam  Constitutional: She is oriented to person, place, and time. She appears well-developed and well-nourished.  HENT:  Head: Normocephalic and atraumatic.  Eyes: Conjunctivae are normal. Pupils are equal, round, and reactive to light. Right eye exhibits no discharge. Left eye exhibits no discharge. No scleral icterus.  Neck: Normal range of motion. No JVD present. No tracheal deviation present.  Cardiovascular: Regular rhythm and normal heart sounds.  Exam reveals no gallop and no friction rub.   No murmur heard. Pulmonary/Chest: Effort normal and breath sounds normal. No stridor. No respiratory distress. She has no wheezes. She has no rales. She exhibits no tenderness.   Abdominal: Soft.  Musculoskeletal: Normal range of motion. She exhibits no edema.  Neurological: She is alert and oriented to person, place, and time. Coordination normal.  Skin: Skin is warm. No rash noted. No erythema. No pallor.  Psychiatric: She has a normal mood and affect. Her behavior is normal. Judgment and thought content normal.  Nursing note and vitals reviewed.   ED Treatments / Results  Labs (all labs ordered are listed, but only abnormal results are displayed) Labs Reviewed  CBC WITH DIFFERENTIAL/PLATELET - Abnormal; Notable for the following:       Result Value   WBC 13.2 (*)    Neutro Abs 9.8 (*)    Monocytes Absolute 2.0 (*)    All other components within normal limits  BASIC METABOLIC PANEL - Abnormal; Notable for the following:  BUN 27 (*)    Creatinine, Ser 1.55 (*)    GFR calc non Af Amer 29 (*)    GFR calc Af Amer 34 (*)    All other components within normal limits  URINALYSIS, ROUTINE W REFLEX MICROSCOPIC - Abnormal; Notable for the following:    Color, Urine STRAW (*)    Specific Gravity, Urine 1.003 (*)    All other components within normal limits  URINE CULTURE  CULTURE, BLOOD (ROUTINE X 2)  CULTURE, BLOOD (ROUTINE X 2)  INFLUENZA PANEL BY PCR (TYPE A & B, H1N1)  ROCKY MTN SPOTTED FVR ABS PNL(IGG+IGM)    EKG  EKG Interpretation  Date/Time:  Monday April 14 2016 14:39:33 EST Ventricular Rate:  93 PR Interval:    QRS Duration: 82 QT Interval:  439 QTC Calculation: 510 R Axis:   84 Text Interpretation:  Sinus rhythm Paired ventricular premature complexes Biatrial enlargement Borderline right axis deviation Borderline repolarization abnormality Prolonged QT interval Since last tracing PVC is new Confirmed by Effie ShyWENTZ  MD, ELLIOTT [phone]([id number]) on 04/14/2016 2:46:22 PM       Radiology Dg Chest 2 View  Result Date: 04/14/2016 CLINICAL DATA:  Pt states headache, body aches and chills since last night, husband states 2 episodes of same over last week,  nonsmoker, no other chest complaints EXAM: CHEST  2 VIEW COMPARISON:  12/24/2015 FINDINGS: The lungs are hyperinflated likely secondary to COPD. There is no focal parenchymal opacity. There is no pleural effusion or pneumothorax. The heart and mediastinal contours are unremarkable. The osseous structures are unremarkable. IMPRESSION: No active cardiopulmonary disease. Electronically Signed   By: Elige KoHetal  Patel   On: 04/14/2016 12:07    Procedures Procedures (including critical care time)  Medications Ordered in ED Medications  sodium chloride 0.9 % bolus 1,000 mL (0 mLs Intravenous Stopped 04/14/16 1450)  sodium chloride 0.9 % bolus 1,000 mL (1,000 mLs Intravenous New Bag/Given 04/14/16 1445)     Initial Impression / Assessment and Plan / ED Course  I have reviewed the triage vital signs and the nursing notes.  Pertinent labs & imaging results that were available during my care of the patient were reviewed by me and considered in my medical decision making (see chart for details).  Clinical Course     Labs:  Urinalysis, CBC, BMP, influenza, blood culture, recommend spotted fever  Imaging: DG chest 2 view  Consults:  Therapeutics: NS  Discharge Meds:   Assessment/Plan:    83 year old female presents today with generalized body aches, and extreme fatigue. She denies any focal complaints, tolerating by mouth, with no acute distress. High suspicion for influenza in this patient, husband requesting recommend spotted fever labs. Patient was given fluids here, basic labs drawn, and reassessed.   Patient was noted be hypoxic with ambulation, reports chest tightness, denies any chest pain or presyncopal signs or symptoms. Patient has a history of smoking, has COPD, and as a new requirement for oxygenation with ambulation here today. Uncertain if this is due to acute viral process. Due to significant hypoxic episode here in the ED Hospital consult for further care of this patient.  Patient has  no signs of atrial fib    Final Clinical Impressions(s) / ED Diagnoses   Final diagnoses:  SOB (shortness of breath)    New Prescriptions New Prescriptions   No medications on file     Eyvonne MechanicJeffrey Courtne Lighty, PA-C 04/14/16 2128    Mancel BaleElliott Wentz, MD 04/15/16 1818

## 2016-04-15 ENCOUNTER — Observation Stay (HOSPITAL_BASED_OUTPATIENT_CLINIC_OR_DEPARTMENT_OTHER): Payer: Medicare Other

## 2016-04-15 ENCOUNTER — Observation Stay (HOSPITAL_COMMUNITY): Payer: Medicare Other

## 2016-04-15 DIAGNOSIS — Z7901 Long term (current) use of anticoagulants: Secondary | ICD-10-CM

## 2016-04-15 DIAGNOSIS — I480 Paroxysmal atrial fibrillation: Secondary | ICD-10-CM | POA: Diagnosis present

## 2016-04-15 DIAGNOSIS — R0602 Shortness of breath: Secondary | ICD-10-CM

## 2016-04-15 DIAGNOSIS — N183 Chronic kidney disease, stage 3 (moderate): Secondary | ICD-10-CM | POA: Diagnosis present

## 2016-04-15 DIAGNOSIS — Z885 Allergy status to narcotic agent status: Secondary | ICD-10-CM | POA: Diagnosis not present

## 2016-04-15 DIAGNOSIS — I4892 Unspecified atrial flutter: Secondary | ICD-10-CM

## 2016-04-15 DIAGNOSIS — J439 Emphysema, unspecified: Secondary | ICD-10-CM | POA: Diagnosis not present

## 2016-04-15 DIAGNOSIS — I5033 Acute on chronic diastolic (congestive) heart failure: Secondary | ICD-10-CM | POA: Diagnosis present

## 2016-04-15 DIAGNOSIS — Z87891 Personal history of nicotine dependence: Secondary | ICD-10-CM | POA: Diagnosis not present

## 2016-04-15 DIAGNOSIS — M353 Polymyalgia rheumatica: Secondary | ICD-10-CM | POA: Diagnosis present

## 2016-04-15 DIAGNOSIS — J9601 Acute respiratory failure with hypoxia: Secondary | ICD-10-CM | POA: Diagnosis present

## 2016-04-15 DIAGNOSIS — E43 Unspecified severe protein-calorie malnutrition: Secondary | ICD-10-CM | POA: Diagnosis present

## 2016-04-15 DIAGNOSIS — J181 Lobar pneumonia, unspecified organism: Secondary | ICD-10-CM | POA: Diagnosis not present

## 2016-04-15 DIAGNOSIS — Z7982 Long term (current) use of aspirin: Secondary | ICD-10-CM | POA: Diagnosis not present

## 2016-04-15 DIAGNOSIS — E7800 Pure hypercholesterolemia, unspecified: Secondary | ICD-10-CM | POA: Diagnosis present

## 2016-04-15 DIAGNOSIS — J441 Chronic obstructive pulmonary disease with (acute) exacerbation: Secondary | ICD-10-CM | POA: Diagnosis present

## 2016-04-15 DIAGNOSIS — J189 Pneumonia, unspecified organism: Secondary | ICD-10-CM | POA: Diagnosis present

## 2016-04-15 DIAGNOSIS — H409 Unspecified glaucoma: Secondary | ICD-10-CM | POA: Diagnosis present

## 2016-04-15 DIAGNOSIS — E785 Hyperlipidemia, unspecified: Secondary | ICD-10-CM | POA: Diagnosis present

## 2016-04-15 DIAGNOSIS — M129 Arthropathy, unspecified: Secondary | ICD-10-CM | POA: Diagnosis not present

## 2016-04-15 DIAGNOSIS — J440 Chronic obstructive pulmonary disease with acute lower respiratory infection: Secondary | ICD-10-CM | POA: Diagnosis present

## 2016-04-15 DIAGNOSIS — Z681 Body mass index (BMI) 19 or less, adult: Secondary | ICD-10-CM | POA: Diagnosis not present

## 2016-04-15 DIAGNOSIS — M1990 Unspecified osteoarthritis, unspecified site: Secondary | ICD-10-CM | POA: Diagnosis present

## 2016-04-15 DIAGNOSIS — I517 Cardiomegaly: Secondary | ICD-10-CM | POA: Diagnosis present

## 2016-04-15 DIAGNOSIS — R0902 Hypoxemia: Secondary | ICD-10-CM | POA: Diagnosis not present

## 2016-04-15 LAB — COMPREHENSIVE METABOLIC PANEL
ALBUMIN: 3 g/dL — AB (ref 3.5–5.0)
ALT: 23 U/L (ref 14–54)
ANION GAP: 9 (ref 5–15)
AST: 18 U/L (ref 15–41)
Alkaline Phosphatase: 40 U/L (ref 38–126)
BUN: 16 mg/dL (ref 6–20)
CALCIUM: 8 mg/dL — AB (ref 8.9–10.3)
CO2: 21 mmol/L — AB (ref 22–32)
CREATININE: 1.35 mg/dL — AB (ref 0.44–1.00)
Chloride: 108 mmol/L (ref 101–111)
GFR calc Af Amer: 40 mL/min — ABNORMAL LOW (ref >60)
GFR calc non Af Amer: 34 mL/min — ABNORMAL LOW (ref >60)
GLUCOSE: 96 mg/dL (ref 65–99)
Potassium: 4.1 mmol/L (ref 3.5–5.1)
SODIUM: 138 mmol/L (ref 135–145)
Total Bilirubin: 0.9 mg/dL (ref 0.3–1.2)
Total Protein: 5.8 g/dL — ABNORMAL LOW (ref 6.5–8.1)

## 2016-04-15 LAB — CBC
HCT: 35.8 % — ABNORMAL LOW (ref 36.0–46.0)
HEMOGLOBIN: 12 g/dL (ref 12.0–15.0)
MCH: 30.6 pg (ref 26.0–34.0)
MCHC: 33.5 g/dL (ref 30.0–36.0)
MCV: 91.3 fL (ref 78.0–100.0)
Platelets: 298 10*3/uL (ref 150–400)
RBC: 3.92 MIL/uL (ref 3.87–5.11)
RDW: 14.8 % (ref 11.5–15.5)
WBC: 9.9 10*3/uL (ref 4.0–10.5)

## 2016-04-15 LAB — ECHOCARDIOGRAM COMPLETE
E decel time: 268 msec
EERAT: 10.72
FS: 33 % (ref 28–44)
Height: 63.5 in
IV/PV OW: 1
LA diam end sys: 28 mm
LADIAMINDEX: 1.88 cm/m2
LASIZE: 28 mm
LAVOL: 36.3 mL
LAVOLA4C: 26.7 mL
LAVOLIN: 24.4 mL/m2
LDCA: 2.27 cm2
LVEEAVG: 10.72
LVEEMED: 10.72
LVELAT: 8.92 cm/s
LVOT SV: 35 mL
LVOT VTI: 15.4 cm
LVOT diameter: 17 mm
LVOT peak vel: 81.1 cm/s
Lateral S' vel: 11.1 cm/s
MV Dec: 268
MVPG: 4 mmHg
MVPKEVEL: 95.6 m/s
PW: 10.1 mm — AB (ref 0.6–1.1)
RV sys press: 33 mmHg
Reg peak vel: 274 cm/s
TDI e' lateral: 8.92
TDI e' medial: 8.49
TRMAXVEL: 274 cm/s
Weight: 1712 oz

## 2016-04-15 LAB — RESPIRATORY PANEL BY PCR
ADENOVIRUS-RVPPCR: NOT DETECTED
Bordetella pertussis: NOT DETECTED
CORONAVIRUS 229E-RVPPCR: NOT DETECTED
CORONAVIRUS HKU1-RVPPCR: NOT DETECTED
CORONAVIRUS NL63-RVPPCR: NOT DETECTED
CORONAVIRUS OC43-RVPPCR: NOT DETECTED
Chlamydophila pneumoniae: NOT DETECTED
Influenza A: NOT DETECTED
Influenza B: NOT DETECTED
METAPNEUMOVIRUS-RVPPCR: NOT DETECTED
Mycoplasma pneumoniae: NOT DETECTED
PARAINFLUENZA VIRUS 1-RVPPCR: NOT DETECTED
PARAINFLUENZA VIRUS 2-RVPPCR: NOT DETECTED
Parainfluenza Virus 3: NOT DETECTED
Parainfluenza Virus 4: NOT DETECTED
RESPIRATORY SYNCYTIAL VIRUS-RVPPCR: NOT DETECTED
Rhinovirus / Enterovirus: NOT DETECTED

## 2016-04-15 LAB — URINE CULTURE: Culture: NO GROWTH

## 2016-04-15 LAB — STREP PNEUMONIAE URINARY ANTIGEN: Strep Pneumo Urinary Antigen: NEGATIVE

## 2016-04-15 LAB — BRAIN NATRIURETIC PEPTIDE: B NATRIURETIC PEPTIDE 5: 283.2 pg/mL — AB (ref 0.0–100.0)

## 2016-04-15 MED ORDER — DEXTROSE 5 % IV SOLN
500.0000 mg | INTRAVENOUS | Status: DC
  Administered 2016-04-15 – 2016-04-17 (×3): 500 mg via INTRAVENOUS
  Filled 2016-04-15 (×4): qty 500

## 2016-04-15 MED ORDER — DEXTROSE 5 % IV SOLN
1.0000 g | INTRAVENOUS | Status: DC
  Administered 2016-04-15 – 2016-04-17 (×3): 1 g via INTRAVENOUS
  Filled 2016-04-15 (×4): qty 10

## 2016-04-15 MED ORDER — IPRATROPIUM-ALBUTEROL 0.5-2.5 (3) MG/3ML IN SOLN
3.0000 mL | RESPIRATORY_TRACT | Status: DC | PRN
  Administered 2016-04-16: 3 mL via RESPIRATORY_TRACT
  Filled 2016-04-15: qty 3

## 2016-04-15 NOTE — Consult Note (Signed)
Reason for Consult: a flutter  Referring Physician: Dr. Wyline Copas  PCP:  Criselda Peaches, MD  Primary [place] is an 83 y.o. female.    Chief Complaint: pt admitted 04/14/16 with DOE.   HPI:  Asked to see 28 yof with hx of a flutter followed by her PCP on chronic anticoagulation on eliquis.  CHA2DS2VASc of 3.  Admitted with "flu like symptoms" of malaise, muscle aches and fevers.  Family reported rapid HR.   She was hypoxic with sp02 of 81% on RA with exertion and mid 90s at rest.  WBC 13,000.   EKG SR at 93 with PAC no acute issues and no old EKG to compare.   BNP 283 Cr 1.55 now 1.35, BNP 283.   Cultures of blood and urine pending.  Influenza A & B neg.  Resp. Panel pending.  Initial CXR with COPD no acute process  and today new RUL infiltrate.   Last evening she developed a flutter, rate controlled and has gone in and out.    Currently sitting up in chair still SOB, no chest pain.  At times she will feel her heart racing at home.    Tele, some SR but mostly flutter and mostly rate controlled up to 130 at times.    Past Medical History:  Diagnosis Date  . Atrial flutter ([place])    NO CARDIOLOGIST FOLLOWED BY DR Conni Slipper  . Bloody stools    LAST 4 DAYS  . Dysrhythmia   . Glaucoma    Severe  . High cholesterol   . PMR (polymyalgia rheumatica) (HCC)     Past Surgical History:  Procedure Laterality Date  . COLONOSCOPY N/A 07/15/2013   Procedure: COLONOSCOPY;  Surgeon: Inda Castle, MD;  Location: [facility];  Service: Endoscopy;  Laterality: N/A;  . ESOPHAGOGASTRODUODENOSCOPY N/A 07/08/2013   Procedure: ESOPHAGOGASTRODUODENOSCOPY (EGD);  Surgeon: Inda Castle, MD;  Location: [facility];  Service: Endoscopy;  Laterality: N/A;  Per Gonzella Lex, will try to have CRNA available, but will not guaranteee. Ok to schedule per Santiago Glad 07/06/13  . GALLBLADDER SURGERY    . IR GENERIC HISTORICAL  05/18/2014   IR RADIOLOGIST EVAL & MGMT  05/18/2014 Greggory Keen, MD [facility] INTERV RAD  . LAMINECTOMY    . leg stent Right   . SHOULDER SURGERY     frozen shoulder/ Bil shoulders    Family History  Problem Relation Age of Onset  . Throat cancer Mother   . Diabetes Maternal Uncle   . Throat cancer Maternal Grandfather   . Cirrhosis Maternal Aunt    Social History:  reports that she quit smoking about 22 years ago. Her smoking use included Cigarettes. She has never used smokeless tobacco. She reports that she does not drink alcohol or use drugs.  Allergies:  Allergies  Allergen Reactions  . Codeine Sulfate Nausea And Vomiting and Other (See Comments)    headache    OUTPATIENT MEDICATIONS: No current facility-administered medications on file prior to encounter.    Current Outpatient Prescriptions on File Prior to Encounter  Medication Sig Dispense Refill  . aspirin 81 MG tablet Take 81 mg by mouth daily.    Marland Kitchen atorvastatin (LIPITOR) 10 MG tablet Take 5 mg by mouth daily.     . cholecalciferol (VITAMIN D) 1000 UNITS tablet Take 3,000 Units by mouth daily.    Marland Kitchen LUMIGAN 0.01 % SOLN Place 1 drop into the left eye  at bedtime.     . traMADol-acetaminophen (ULTRACET) 37.5-325 MG per tablet Take 1-2 tablets by mouth 2 (two) times daily as needed for moderate pain.     . TRAVATAN Z 0.004 % SOLN ophthalmic solution Place 1 drop into both eyes at bedtime.     . vitamin E 1000 UNIT capsule Take 2,000 Units by mouth daily.    . Na Sulfate-K Sulfate-Mg Sulf (SUPREP BOWEL PREP) SOLN Suprep as directed / no substitutions (Patient not taking: Reported on 04/14/2016) 354 mL 0   CURRENT MEDICATIONS: Scheduled Meds: . apixaban  2.5 mg Oral BID  . aspirin EC  81 mg Oral Daily  . atorvastatin  5 mg Oral Daily  . cholecalciferol  3,000 Units Oral Daily  . diltiazem  120 mg Oral BID  . latanoprost  1 drop Both Eyes QHS  . predniSONE  5 mg Oral Daily  . sodium chloride flush  3 mL Intravenous Q12H   Continuous Infusions: . sodium chloride  75 mL/hr at 04/15/16 0225   PRN Meds:.acetaminophen **OR** acetaminophen, ipratropium-albuterol, traMADol-acetaminophen   Results for orders placed or performed during the hospital encounter of 04/14/16 (from the past 48 hour(s))  CBC with Differential     Status: Abnormal   Collection Time: 04/14/16 12:12 PM  Result Value Ref Range   WBC 13.2 (H) 4.0 - 10.5 K/uL   RBC 4.05 3.87 - 5.11 MIL/uL   Hemoglobin 12.8 12.0 - 15.0 g/dL   HCT 37.7 36.0 - 46.0 %   MCV 93.1 78.0 - 100.0 fL   MCH 31.6 26.0 - 34.0 pg   MCHC 34.0 30.0 - 36.0 g/dL   RDW 15.0 11.5 - 15.5 %   Platelets 317 150 - 400 K/uL   Neutrophils Relative % 74 %   Neutro Abs 9.8 (H) 1.7 - 7.7 K/uL   Lymphocytes Relative 8 %   Lymphs Abs 1.0 0.7 - 4.0 K/uL   Monocytes Relative 15 %   Monocytes Absolute 2.0 (H) 0.1 - 1.0 K/uL   Eosinophils Relative 3 %   Eosinophils Absolute 0.4 0.0 - 0.7 K/uL   Basophils Relative 0 %   Basophils Absolute 0.0 0.0 - 0.1 K/uL  Basic metabolic panel     Status: Abnormal   Collection Time: 04/14/16 12:12 PM  Result Value Ref Range   Sodium 136 135 - 145 mmol/L   Potassium 3.8 3.5 - 5.1 mmol/L   Chloride 104 101 - 111 mmol/L   CO2 22 22 - 32 mmol/L   Glucose, Bld 95 65 - 99 mg/dL   BUN 27 (H) 6 - 20 mg/dL   Creatinine, Ser 1.55 (H) 0.44 - 1.00 mg/dL   Calcium 8.9 8.9 - 10.3 mg/dL   GFR calc non Af Amer 29 (L) >60 mL/min   GFR calc Af Amer 34 (L) >60 mL/min    Comment: (NOTE) The eGFR has been calculated using the CKD EPI equation. This calculation has not been validated in all clinical situations. eGFR's persistently <60 mL/min signify possible Chronic Kidney Disease.    Anion gap 10 5 - 15  Influenza panel by PCR (type A & B, H1N1)     Status: None   Collection Time: 04/14/16 12:12 PM  Result Value Ref Range   Influenza A By PCR NEGATIVE NEGATIVE   Influenza B By PCR NEGATIVE NEGATIVE    Comment: (NOTE) The Xpert Xpress Flu assay is intended as an aid in the diagnosis of    influenza and should not  be used as a sole basis for treatment.  This  assay is FDA approved for nasopharyngeal swab specimens only. Nasal  washings and aspirates are unacceptable for Xpert Xpress Flu testing. Performed at [facility]   Urinalysis, Routine w reflex microscopic     Status: Abnormal   Collection Time: 04/14/16  1:45 PM  Result Value Ref Range   Color, Urine STRAW (A) YELLOW   APPearance CLEAR CLEAR   Specific Gravity, Urine 1.003 (L) 1.005 - 1.030   pH 6.0 5.0 - 8.0   Glucose, UA NEGATIVE NEGATIVE mg/dL   Hgb urine dipstick NEGATIVE NEGATIVE   Bilirubin Urine NEGATIVE NEGATIVE   Ketones, ur NEGATIVE NEGATIVE mg/dL   Protein, ur NEGATIVE NEGATIVE mg/dL   Nitrite NEGATIVE NEGATIVE   Leukocytes, UA NEGATIVE NEGATIVE  Comprehensive metabolic panel     Status: Abnormal   Collection Time: 04/15/16  6:09 AM  Result Value Ref Range   Sodium 138 135 - 145 mmol/L   Potassium 4.1 3.5 - 5.1 mmol/L   Chloride 108 101 - 111 mmol/L   CO2 21 (L) 22 - 32 mmol/L   Glucose, Bld 96 65 - 99 mg/dL   BUN 16 6 - 20 mg/dL   Creatinine, Ser 1.35 (H) 0.44 - 1.00 mg/dL   Calcium 8.0 (L) 8.9 - 10.3 mg/dL   Total Protein 5.8 (L) 6.5 - 8.1 g/dL   Albumin 3.0 (L) 3.5 - 5.0 g/dL   AST 18 15 - 41 U/L   ALT 23 14 - 54 U/L   Alkaline Phosphatase 40 38 - 126 U/L   Total Bilirubin 0.9 0.3 - 1.2 mg/dL   GFR calc non Af Amer 34 (L) >60 mL/min   GFR calc Af Amer 40 (L) >60 mL/min    Comment: (NOTE) The eGFR has been calculated using the CKD EPI equation. This calculation has not been validated in all clinical situations. eGFR's persistently <60 mL/min signify possible Chronic Kidney Disease.    Anion gap 9 5 - 15  CBC     Status: Abnormal   Collection Time: 04/15/16  6:09 AM  Result Value Ref Range   WBC 9.9 4.0 - 10.5 K/uL   RBC 3.92 3.87 - 5.11 MIL/uL   Hemoglobin 12.0 12.0 - 15.0 g/dL   HCT 35.8 (L) 36.0 - 46.0 %   MCV 91.3 78.0 - 100.0 fL   MCH 30.6 26.0 - 34.0 pg   MCHC  33.5 30.0 - 36.0 g/dL   RDW 14.8 11.5 - 15.5 %   Platelets 298 150 - 400 K/uL   Dg Chest 2 View  Result Date: 04/14/2016 CLINICAL DATA:  Pt states headache, body aches and chills since last night, husband states 2 episodes of same over last week, nonsmoker, no other chest complaints EXAM: CHEST  2 VIEW COMPARISON:  12/24/2015 FINDINGS: The lungs are hyperinflated likely secondary to COPD. There is no focal parenchymal opacity. There is no pleural effusion or pneumothorax. The heart and mediastinal contours are unremarkable. The osseous structures are unremarkable. IMPRESSION: No active cardiopulmonary disease. Electronically Signed   By: Kathreen Devoid   On: 04/14/2016 12:07   Dg Chest Port 1 View  Result Date: 04/15/2016 CLINICAL DATA:  Chest pain and shortness of breath for 2 days EXAM: PORTABLE CHEST 1 VIEW COMPARISON:  04/14/2016 FINDINGS: Cardiac shadow is stable. Aortic calcifications are again seen. New right upper lobe infiltrate is noted which was not seen on the prior exam. No acute bony abnormality is noted.  IMPRESSION: New right upper lobe infiltrate. Electronically Signed   By: Inez Catalina M.D.   On: 04/15/2016 11:06    ROS: General:no colds or fevers, no weight changes Skin:no rashes or ulcers HEENT:no blurred vision, no congestion CV:see HPI PUL:see HPI GI:no diarrhea constipation or melena, no indigestion GU:no hematuria, no dysuria MS:no joint pain, no claudication Neuro:no syncope, no lightheadedness Endo:no diabetes, no thyroid disease  Blood pressure 119/85, pulse 90, temperature 98.8 F (37.1 C), temperature source Oral, resp. rate 20, height 5' 3.5" (1.613 m), weight 48.5 kg (107 lb), SpO2 94 %.  Wt Readings from Last 3 Encounters:  04/14/16 48.5 kg (107 lb)  07/15/13 49.4 kg (109 lb)  07/12/13 49.7 kg (109 lb 9.6 oz)    PE: General:Pleasant affect, NAD Skin:Warm and dry, brisk capillary refill HEENT:normocephalic, sclera clear, mucus membranes moist Neck:supple,  no JVD, no bruits  Heart:irreg irreg without murmur, gallup, rub or click Lungs:diminished breath sounds without rales, rhonchi, or wheezes QIH:KVQQ, non tender, + BS, do not palpate liver spleen or masses Ext:no lower ext edema, 2+ pedal pulses, 2+ radial pulses Neuro:alert and oriented X 3, MAE, follows commands, + facial symmetry   Assessment/Plan Principal Problem:   Hypoxia Active Problems:   Arthropathy   Paroxysmal a-fib (HCC)   Chronic anticoagulation  Atrial flutter rate controlled, she is going in and out.  She is anticoagulated.  May be having more episodes with viral illness, CXR today appears to be PNA.  She is on dilt at home at 120 mg BID.  This continues, also anticoagulated with Eliquis for ChA2DS2VASc score of 3.  BP is soft would be difficult to increase meds.  Will check echo.  Dr. Ellyn Hack to see.   Hypoxia now on oxygen.  CXR PNA. Per IM.      Cecilie Kicks  Nurse Practitioner Certified [place] Pager [phone] or after 5pm or weekends call [phone] 04/15/2016, 11:38 AM  I have seen, examined and evaluated the patient this PM along with Cecilie Kicks, NP.  After reviewing all the available data and chart, we discussed the patients laboratory, study & physical findings as well as symptoms in detail. I agree with her findings, examination as well as impression recommendations as per our discussion.    - Echo performed: Study Conclusions  - Left ventricle: The cavity size was normal. Systolic function was   normal. The estimated ejection fraction was in the range of 55%   to 60%. Wall motion was normal; there were no regional wall   motion abnormalities. Normal sinus rhythm was absent. The study   is not technically sufficient to allow evaluation of LV diastolic   function. - Aortic valve: Trileaflet; normal thickness, mildly calcified   leaflets. There was trivial regurgitation. - Pulmonary arteries: PA peak pressure: 33 mm Hg  (S).   Essentially normal Echo.  Relatively asymptomatic & rate controlled A Flutter on CCB. On appropriate anticoagulation.  I do no think that her Sx are c/w CHF -- more c/w PNA.    Will sign off    Glenetta Hew, M.D., M.S. Interventional Cardiologist   Pager # [phone] Phone # [phone] [address] [place], [place] [address]

## 2016-04-15 NOTE — Evaluation (Signed)
Physical Therapy Evaluation Patient Details Name: [name] MRN: [id number] DOB: 1927-11-03 Today's Date: 04/15/2016   History of Present Illness  83 y.o. female with medical history significant of afib on chronic anticoagulation who presents with two weeks of worsening malaise, muscle aches, "flu type symptoms" that got notably worse 2-3 days prior to hospital admission associated with subjective fevers.; chest xray = R UL infiltrate  Clinical Impression  Pt admitted with above diagnosis. Pt currently with functional limitations due to the deficits listed below (see PT Problem List). Pt will benefit from skilled PT to increase their independence and safety with mobility to allow discharge to the venue listed below.   Pt is deconditioned, with incr WOB during transfers and amb and requiring O2 at 3L to maintain sats >90%, HR up to 145 when nearing end of amb x 45'; recommend STSNF     Follow Up Recommendations SNF;Supervision/Assistance - 24 hour    Equipment Recommendations  Rolling walker with 5" wheels    Recommendations for Other Services       Precautions / Restrictions Precautions Precautions: Fall      Mobility  Bed Mobility Overal bed mobility: Needs Assistance Bed Mobility: Supine to Sit     Supine to sit: Min guard;HOB elevated     General bed mobility comments: incr time, use of rail, needed rest break after transition d/t incr WOB  Transfers Overall transfer level: Needs assistance Equipment used: Rolling walker (2 wheeled) Transfers: Sit to/from Stand Sit to Stand: Min assist         General transfer comment: cues for safety and hand placement  Ambulation/Gait Ambulation/Gait assistance: Min assist Ambulation Distance (Feet): 45 Feet Assistive device: Rolling walker (2 wheeled) Gait Pattern/deviations: Step-through pattern;Decreased stride length;Trunk flexed     General Gait Details: cues for breathing, assist for balance during turns, 2  standing rests d/t DOE; O2 sats 90-96% on 3L throughout session  Stairs            Wheelchair Mobility    Modified Rankin (Stroke Patients Only)       Balance Overall balance assessment: Needs assistance           Standing balance-Leahy Scale: Fair                               Pertinent Vitals/Pain Pain Assessment: No/denies pain    Home Living Family/patient expects to be discharged to:: Private residence Living Arrangements: Spouse/significant other   Type of Home: House Home Access: Level entry     Home Layout: One level Home Equipment: None      Prior Function Level of Independence: Independent         Comments: pt is very active and IND at her baseline, manages all household duties; pt and dtr state that pt's husband(retired MD) is "no help what so ever"     Hand Dominance        Extremity/Trunk Assessment   Upper Extremity Assessment Upper Extremity Assessment: Generalized weakness    Lower Extremity Assessment Lower Extremity Assessment: Generalized weakness       Communication   Communication: No difficulties  Cognition Arousal/Alertness: Awake/alert Behavior During Therapy: WFL for tasks assessed/performed Overall Cognitive Status: Within Functional Limits for tasks assessed                      General Comments      Exercises  Assessment/Plan    PT Assessment Patient needs continued PT services  PT Problem List Decreased strength;Decreased range of motion;Decreased activity tolerance;Decreased mobility;Decreased knowledge of use of DME;Cardiopulmonary status limiting activity          PT Treatment Interventions DME instruction;Gait training;Functional mobility training;Therapeutic activities;Therapeutic exercise;Patient/family education    PT Goals (Current goals can be found in the Care Plan section)  Acute Rehab PT Goals Patient Stated Goal: to feel better, get stronger PT Goal Formulation:  With patient Time For Goal Achievement: 04/29/16 Potential to Achieve Goals: Good    Frequency Min 3X/week   Barriers to discharge        Co-evaluation               End of Session Equipment Utilized During Treatment: Gait belt;Oxygen Activity Tolerance: Patient tolerated treatment well Patient left: with call bell/phone within reach;in chair;with family/visitor present      Functional Assessment Tool Used: clinical judgement Functional Limitation: Mobility: Walking and moving around Mobility: Walking and Moving Around Current Status ([id number](G8978): At least 20 percent but less than 40 percent impaired, limited or restricted Mobility: Walking and Moving Around Goal Status [phone](G8979): At least 1 percent but less than 20 percent impaired, limited or restricted    Time: 1148-1206 PT Time Calculation (min) (ACUTE ONLY): 18 min   Charges:   PT Evaluation $PT Eval Moderate Complexity: 1 Procedure     PT G Codes:   PT G-Codes **NOT FOR INPATIENT CLASS** Functional Assessment Tool Used: clinical judgement Functional Limitation: Mobility: Walking and moving around Mobility: Walking and Moving Around Current Status ([id number](G8978): At least 20 percent but less than 40 percent impaired, limited or restricted Mobility: Walking and Moving Around Goal Status [phone](G8979): At least 1 percent but less than 20 percent impaired, limited or restricted    [facility] Kent Mcnew Family Medical CenterWILLIAMS,Khadar Monger 04/15/2016, 12:36 PM

## 2016-04-15 NOTE — Progress Notes (Signed)
Pt CXR + pneumonia per radiology.  Dr. Rhona Leavenshiu notified. [name], [name] B

## 2016-04-15 NOTE — Care Management Note (Signed)
Case Management Note  Patient Details  Name: [name] MRN: [id number] Date of Birth: [birth year]  Subjective/Objective:83 y/o f admitted w/hypoxia. From home w/spouse. PT cons-await recc.                    Action/Plan:d/c plan SNF.   Expected Discharge Date:   (unknown)               Expected Discharge Plan:  Skilled Nursing Facility  In-House Referral:  Clinical Social Work  Discharge planning Services  CM Consult  Post Acute Care Choice:    Choice offered to:     DME Arranged:    DME Agency:     HH Arranged:    HH Agency:     Status of Service:  In process, will continue to follow  If discussed at Long Length of Stay Meetings, dates discussed:    Additional Comments:  Lanier ClamMahabir, Shelby Mill[place], RN 04/15/2016, 2:37 PM

## 2016-04-15 NOTE — NC FL2 (Signed)
[place] MEDICAID FL2 LEVEL OF CARE SCREENING TOOL     IDENTIFICATION  Patient Name: [name] Birthdate: 01-22-1928 Sex: female Admission Date (Current Location): 04/14/2016  [facility] and [place] Number:  Producer, television/film/video and Address:  [facility],  [address] [place], [place] [address]      Provider Number: [id number]  Attending Physician Name and Address:  Jerald Kief, MD  Relative Name and Phone Number:       Current Level of Care: Hospital Recommended Level of Care: Skilled Nursing Facility Prior Approval Number:    Date Approved/Denied:   PASRR Number: [id number]  Discharge Plan: SNF    Current Diagnoses: Patient Active Problem List   Diagnosis Date Noted  . Hypoxia 04/14/2016  . Paroxysmal a-fib (HCC) 04/14/2016  . Chronic anticoagulation 04/14/2016  . Diverticulosis of colon (without mention of hemorrhage) 07/15/2013  . Blood in stool 07/06/2013  . Arthropathy 04/06/2007  . ABDOMINAL PAIN, RIGHT UPPER QUADRANT 04/06/2007  . ABDOMINAL PAIN, EPIGASTRIC 04/06/2007  . ESOPHAGITIS 03/29/2007    Orientation RESPIRATION BLADDER Height & Weight     Self, Time, Situation, Place  O2 (4L) Continent Weight: 107 lb (48.5 kg) Height:  5' 3.5" (161.3 cm)  BEHAVIORAL SYMPTOMS/MOOD NEUROLOGICAL BOWEL NUTRITION STATUS      Continent Diet (Heart)  AMBULATORY STATUS COMMUNICATION OF NEEDS Skin   Extensive Assist Verbally Normal                       Personal Care Assistance Level of Assistance  Bathing, Dressing Bathing Assistance: Limited assistance   Dressing Assistance: Limited assistance     Functional Limitations Info             SPECIAL CARE FACTORS FREQUENCY  PT (By licensed PT), OT (By licensed OT)     PT Frequency: 5 OT Frequency: 5            Contractures      Additional Factors Info  Code Status, Allergies Code Status Info: Fullcode Allergies Info: Codeine Sulfate           Current Medications  (04/15/2016):  This is the current hospital active medication list Current Facility-Administered Medications  Medication Dose Route Frequency Provider Last Rate Last Dose  . 0.9 %  sodium chloride infusion   Intravenous Continuous Jerald Kief, MD 75 mL/hr at 04/15/16 0225    . acetaminophen (TYLENOL) tablet 650 mg  650 mg Oral Q6H PRN Jerald Kief, MD       Or  . acetaminophen (TYLENOL) suppository 650 mg  650 mg Rectal Q6H PRN Jerald Kief, MD      . apixaban Everlene Balls) tablet 2.5 mg  2.5 mg Oral BID Jerald Kief, MD   2.5 mg at 04/15/16 1106  . aspirin EC tablet 81 mg  81 mg Oral Daily Jerald Kief, MD   81 mg at 04/15/16 1105  . atorvastatin (LIPITOR) tablet 5 mg  5 mg Oral Daily Jerald Kief, MD   5 mg at 04/15/16 1105  . azithromycin (ZITHROMAX) 500 mg in dextrose 5 % 250 mL IVPB  500 mg Intravenous Q24H Jerald Kief, MD      . cefTRIAXone (ROCEPHIN) 1 g in dextrose 5 % 50 mL IVPB  1 g Intravenous Q24H Jerald Kief, MD      . cholecalciferol (VITAMIN D) tablet 3,000 Units  3,000 Units Oral Daily Jerald Kief, MD   3,000 Units  at 04/15/16 1105  . diltiazem (CARDIZEM CD) 24 hr capsule 120 mg  120 mg Oral BID Jerald KiefStephen K Chiu, MD   120 mg at 04/15/16 1105  . ipratropium-albuterol (DUONEB) 0.5-2.5 (3) MG/3ML nebulizer solution 3 mL  3 mL Nebulization Q4H PRN Jerald KiefStephen K Chiu, MD      . latanoprost (XALATAN) 0.005 % ophthalmic solution 1 drop  1 drop Both Eyes QHS Jerald KiefStephen K Chiu, MD   1 drop at 04/14/16 2057  . predniSONE (DELTASONE) tablet 5 mg  5 mg Oral Daily Jerald KiefStephen K Chiu, MD   5 mg at 04/15/16 1104  . sodium chloride flush (NS) 0.9 % injection 3 mL  3 mL Intravenous Q12H Jerald KiefStephen K Chiu, MD      . traMADol-acetaminophen [facility](ULTRACET) 37.5-325 MG per tablet 1-2 tablet  1-2 tablet Oral BID PRN Jerald KiefStephen K Chiu, MD   2 tablet at 04/15/16 1105     Discharge Medications: Please see discharge summary for a list of discharge medications.  Relevant Imaging Results:  Relevant Lab  Results:   Additional Information SSN: [id number]  Arlyss RepressHarrison, Radhika Dershem F, LCSW

## 2016-04-15 NOTE — Progress Notes (Signed)
PROGRESS NOTE    [name]  ZOX:[id number] DOB: 01-03-1928 DOA: 04/14/2016 PCP: Enrique Sack, MD    Brief Narrative:  83 y.o. female with medical history significant of afib on chronic anticoagulation who presents with two weeks of worsening malaise, muscle aches, "flu type symptoms" that got notably worse 2-3 days prior to hospital admission associated with subjective fevers. Patient subsequently presented to the ED for further work up. Family present in room reports concerns of rapid heart rate.   In the ED, patient was noted to be rate controlled. Patient noted to be afebrile with mild leukocytosis of 13k. Patient During evaluation, patient was found to be hypoxic with O2 sats of 81% on RA, returning to mid-90's at rest. CXR was found to be unremarkable. Hospitalist service consulted for consideration for admission.  Assessment & Plan:   Principal Problem:   Hypoxia Active Problems:   Arthropathy   Paroxysmal a-fib (HCC)   Chronic anticoagulation   1. Hypoxia on ambulation secondary to CAP, without sepsis 1. O2 sats presently stable 2. Initial CXR on presentation was clear. Enlarged heart noted 3. No wheezing on exam 4. Pt on prednisone per home regimen 5. Pt appears with more dyspnea this AM. 6. Have ordered repeat CXR. Reviewed with findings of R upper PNA 7. Have started IV azithromycin and rocephin 8. Will check pan-cultures 9. No leukocytosis. Afebrile 10. Will recheck CBC in AM 2. Hx paroxysmal afib 1. Remains rate controlled 2. Will continue to monitor on tele 3. Have consulted Cardiology per family request 4. Unremarkable echo. Patient stable. Cardiology signed off 3. Chronic anticoagulation 1. Will continue therapeutic anticoagulation per above 2. Without bleeding 4. Arthitis 1. Stable 2. Remains stable  DVT prophylaxis: therapeutic anticoagulation Code Status: Full Family Communication: Pt in room, family at bedside Disposition Plan: Uncertain at  this time  Consultants:   Cardiology  Procedures:     Antimicrobials: Anti-infectives    Start     Dose/Rate Route Frequency Ordered Stop   04/15/16 1500  azithromycin (ZITHROMAX) 500 mg in dextrose 5 % 250 mL IVPB     500 mg 250 mL/hr over 60 Minutes Intravenous Every 24 hours 04/15/16 1314 04/22/16 1459   04/15/16 1400  cefTRIAXone (ROCEPHIN) 1 g in dextrose 5 % 50 mL IVPB     1 g 100 mL/hr over 30 Minutes Intravenous Every 24 hours 04/15/16 1314 04/22/16 1359       Subjective: Reports mild sob this AM  Objective: Vitals:   04/15/16 0508 04/15/16 1105 04/15/16 1122 04/15/16 1433  BP: 107/79 119/85  97/63  Pulse: 90   89  Resp: 20   (!) 22  Temp: 98.8 F (37.1 C)   98.2 F (36.8 C)  TempSrc: Oral   Oral  SpO2: 94%  94% 94%  Weight:      Height:        Intake/Output Summary (Last 24 hours) at 04/15/16 1608 Last data filed at 04/15/16 0900  Gross per 24 hour  Intake             1530 ml  Output                1 ml  Net             1529 ml   Filed Weights   04/14/16 1033 04/14/16 1825  Weight: 49 kg (108 lb) 48.5 kg (107 lb)    Examination:  General exam: Laying in bed, conversant, in nad Respiratory  system: normal resp effort, no audible wheezing Cardiovascular system: regular rate, s1, s2 Gastrointestinal system: Abdomen is nondistended, soft and nontender. No organomegaly or masses felt. Normal bowel sounds heard. Central nervous system: Alert and oriented. No focal neurological deficits. Extremities: Symmetric 5 x 5 power. Skin: No rashes, lesions Psychiatry: Judgement and insight appear normal. Mood & affect appropriate.   Data Reviewed: I have personally reviewed following labs and imaging studies  CBC:  Recent Labs Lab 04/14/16 1212 04/15/16 0609  WBC 13.2* 9.9  NEUTROABS 9.8*  --   HGB 12.8 12.0  HCT 37.7 35.8*  MCV 93.1 91.3  PLT 317 298   Basic Metabolic Panel:  Recent Labs Lab 04/14/16 1212 04/15/16 0609  NA 136 138  K 3.8  4.1  CL 104 108  CO2 22 21*  GLUCOSE 95 96  BUN 27* 16  CREATININE 1.55* 1.35*  CALCIUM 8.9 8.0*   GFR: Estimated Creatinine Clearance: 22.9 mL/min (by C-G formula based on SCr of 1.35 mg/dL (H)). Liver Function Tests:  Recent Labs Lab 04/15/16 0609  AST 18  ALT 23  ALKPHOS 40  BILITOT 0.9  PROT 5.8*  ALBUMIN 3.0*   No results for input(s): LIPASE, AMYLASE in the last 168 hours. No results for input(s): AMMONIA in the last 168 hours. Coagulation Profile: No results for input(s): INR, PROTIME in the last 168 hours. Cardiac Enzymes: No results for input(s): CKTOTAL, CKMB, CKMBINDEX, TROPONINI in the last 168 hours. BNP (last 3 results) No results for input(s): PROBNP in the last 8760 hours. HbA1C: No results for input(s): HGBA1C in the last 72 hours. CBG: No results for input(s): GLUCAP in the last 168 hours. Lipid Profile: No results for input(s): CHOL, HDL, LDLCALC, TRIG, CHOLHDL, LDLDIRECT in the last 72 hours. Thyroid Function Tests: No results for input(s): TSH, T4TOTAL, FREET4, T3FREE, THYROIDAB in the last 72 hours. Anemia Panel: No results for input(s): VITAMINB12, FOLATE, FERRITIN, TIBC, IRON, RETICCTPCT in the last 72 hours. Sepsis Labs: No results for input(s): PROCALCITON, LATICACIDVEN in the last 168 hours.  Recent Results (from the past 240 hour(s))  Culture, blood (Routine X 2) w Reflex to ID Panel     Status: None (Preliminary result)   Collection Time: 04/14/16 12:12 PM  Result Value Ref Range Status   Specimen Description BLOOD RIGHT ANTECUBITAL  Final   Special Requests BOTTLES DRAWN AEROBIC AND ANAEROBIC 5CC  Final   Culture   Final    NO GROWTH 1 DAY Performed at [facility]    Report Status PENDING  Incomplete  Culture, blood (Routine X 2) w Reflex to ID Panel     Status: None (Preliminary result)   Collection Time: 04/14/16 12:12 PM  Result Value Ref Range Status   Specimen Description BLOOD RIGHT WRIST  Final   Special Requests  BOTTLES DRAWN AEROBIC AND ANAEROBIC 5ML  Final   Culture   Final    NO GROWTH 1 DAY Performed at [facility]    Report Status PENDING  Incomplete  Urine culture     Status: None   Collection Time: 04/14/16  1:45 PM  Result Value Ref Range Status   Specimen Description URINE, CLEAN CATCH  Final   Special Requests NONE  Final   Culture NO GROWTH Performed at [facility]   Final   Report Status 04/15/2016 FINAL  Final  Respiratory Panel by PCR     Status: None   Collection Time: 04/14/16  4:00 PM  Result Value  Ref Range Status   Adenovirus NOT DETECTED NOT DETECTED Final   Coronavirus 229E NOT DETECTED NOT DETECTED Final   Coronavirus HKU1 NOT DETECTED NOT DETECTED Final   Coronavirus NL63 NOT DETECTED NOT DETECTED Final   Coronavirus OC43 NOT DETECTED NOT DETECTED Final   Metapneumovirus NOT DETECTED NOT DETECTED Final   Rhinovirus / Enterovirus NOT DETECTED NOT DETECTED Final   Influenza A NOT DETECTED NOT DETECTED Final   Influenza B NOT DETECTED NOT DETECTED Final   Parainfluenza Virus 1 NOT DETECTED NOT DETECTED Final   Parainfluenza Virus 2 NOT DETECTED NOT DETECTED Final   Parainfluenza Virus 3 NOT DETECTED NOT DETECTED Final   Parainfluenza Virus 4 NOT DETECTED NOT DETECTED Final   Respiratory Syncytial Virus NOT DETECTED NOT DETECTED Final   Bordetella pertussis NOT DETECTED NOT DETECTED Final   Chlamydophila pneumoniae NOT DETECTED NOT DETECTED Final   Mycoplasma pneumoniae NOT DETECTED NOT DETECTED Final    Comment: Performed at [facility]     Radiology Studies: Dg Chest 2 View  Result Date: 04/14/2016 CLINICAL DATA:  Pt states headache, body aches and chills since last night, husband states 2 episodes of same over last week, nonsmoker, no other chest complaints EXAM: CHEST  2 VIEW COMPARISON:  12/24/2015 FINDINGS: The lungs are hyperinflated likely secondary to COPD. There is no focal parenchymal opacity. There is no pleural effusion or  pneumothorax. The heart and mediastinal contours are unremarkable. The osseous structures are unremarkable. IMPRESSION: No active cardiopulmonary disease. Electronically Signed   By: Elige Ko   On: 04/14/2016 12:07   Dg Chest Port 1 View  Result Date: 04/15/2016 CLINICAL DATA:  Chest pain and shortness of breath for 2 days EXAM: PORTABLE CHEST 1 VIEW COMPARISON:  04/14/2016 FINDINGS: Cardiac shadow is stable. Aortic calcifications are again seen. New right upper lobe infiltrate is noted which was not seen on the prior exam. No acute bony abnormality is noted. IMPRESSION: New right upper lobe infiltrate. Electronically Signed   By: Alcide Clever M.D.   On: 04/15/2016 11:06    Scheduled Meds: . apixaban  2.5 mg Oral BID  . aspirin EC  81 mg Oral Daily  . atorvastatin  5 mg Oral Daily  . azithromycin  500 mg Intravenous Q24H  . cefTRIAXone (ROCEPHIN)  IV  1 g Intravenous Q24H  . cholecalciferol  3,000 Units Oral Daily  . diltiazem  120 mg Oral BID  . latanoprost  1 drop Both Eyes QHS  . predniSONE  5 mg Oral Daily  . sodium chloride flush  3 mL Intravenous Q12H   Continuous Infusions: . sodium chloride 75 mL/hr at 04/15/16 1517     LOS: 0 days   Aunesti Pellegrino, Scheryl Marten, MD [facility] Pager [phone]  If 7PM-7AM, please contact night-coverage [email or web address] Password [email or web address] 04/15/2016, 4:08 PM

## 2016-04-15 NOTE — Progress Notes (Signed)
  Echocardiogram 2D Echocardiogram has been performed.  Nolon RodBrown, Tony 04/15/2016, 3:46 PM

## 2016-04-15 NOTE — Care Management Obs Status (Signed)
MEDICARE OBSERVATION STATUS NOTIFICATION   Patient Details  Name: [name] MRN: [id number] Date of Birth: 01-14-1928   Medicare Observation Status Notification Given:  Yes    MahabirOlegario Messier, [place] Talcott, RN 04/15/2016, 2:36 PM

## 2016-04-15 NOTE — Clinical Social Work Placement (Signed)
   CLINICAL SOCIAL WORK PLACEMENT  NOTE  Date:  04/15/2016  Patient Details  Name: [name] MRN: [id number] Date of Birth: 01-15-1928  Clinical Social Work is seeking post-discharge placement for this patient at the Skilled  Nursing Facility level of care (*CSW will initial, date and re-position this form in  chart as items are completed):  Yes   Patient/family provided with [name] Clinical Social Work Department's list of facilities offering this level of care within the geographic area requested by the patient (or if unable, by the patient's family).  Yes   Patient/family informed of their freedom to choose among providers that offer the needed level of care, that participate in Medicare, Medicaid or managed care program needed by the patient, have an available bed and are willing to accept the patient.  Yes   Patient/family informed of [place]'s ownership interest in [facility] and [facility], as well as of the fact that they are under no obligation to receive care at these facilities.  PASRR submitted to EDS on 04/15/16     PASRR number received on 04/15/16     Existing PASRR number confirmed on       FL2 transmitted to all facilities in geographic area requested by pt/family on 04/15/16     FL2 transmitted to all facilities within larger geographic area on       Patient informed that his/her managed care company has contracts with or will negotiate with certain facilities, including the following:        Yes   Patient/family informed of bed offers received.  Patient chooses bed at       Physician recommends and patient chooses bed at      Patient to be transferred to   on  .  Patient to be transferred to facility by       Patient family notified on   of transfer.  Name of family member notified:        PHYSICIAN       Additional Comment:    _______________________________________________ Arlyss RepressHarrison, Brinklee Cisse F, LCSW 04/15/2016, 3:39 PM

## 2016-04-15 NOTE — Clinical Social Work Note (Signed)
Clinical Social Work Assessment  Patient Details  Name: [name] MRN: [id number] Date of Birth: [birth year]  Date of referral:  04/15/16               Reason for consult:  Facility Placement                Permission sought to share information with:  [facility]cean[facility] Contact Representative Permission granted to share information::  Yes, Verbal Permission Granted  Name::        Agency::     Relationship::     Contact Information:     Housing/Transportation Living arrangements for the past 2 months:  Single Family Home Source of Information:  Spouse Patient Interpreter Needed:  None Criminal Activity/Legal Involvement Pertinent to Current Situation/Hospitalization:  No - Comment as needed Significant Relationships:  Adult Children, Spouse Lives with:  Spouse Do you feel safe going back to the place where you live?  No Need for family participation in patient care:  Yes (Comment)  Care giving concerns:  CSW reviewed PT evaluation recommending SNF at discharge.    Social Worker assessment / plan:  CSW spoke with patient's husband re: discharge planning. Husband is agreeable with plan for SNF, requesting [facility], [facility], [facility] or [facility]. CSW awaiting calls back from [place] at [facility], [place] at [facility] & Tresa EndoKelly at [facility] re: bed availability. Patient was just changed to inpatient today, therefore would have a qualifying hospital stay for SNF on Friday.   Employment status:  Retired Health and safety inspectornsurance information:  Medicare PT Recommendations:  Skilled Nursing Facility Information / Referral to community resources:  Skilled Nursing Facility  Patient/Family's Response to care:  Patient's husband is agreeable with plan for SNF, states that he understands that she would need to get stronger before returning home.   Patient/Family's Understanding of and Emotional Response to Diagnosis, Current Treatment, and Prognosis:    Emotional  Assessment Appearance:  Appears stated age Attitude/Demeanor/Rapport:    Affect (typically observed):    Orientation:  Oriented to Self, Oriented to Place, Oriented to  Time, Oriented to Situation Alcohol / Substance use:    Psych involvement (Current and /or in the community):     Discharge Needs  Concerns to be addressed:    Readmission within the last 30 days:    Current discharge risk:    Barriers to Discharge:      Arlyss RepressHarrison, Brayton Baumgartner F, LCSW 04/15/2016, 3:32 PM

## 2016-04-16 DIAGNOSIS — I480 Paroxysmal atrial fibrillation: Secondary | ICD-10-CM

## 2016-04-16 DIAGNOSIS — J181 Lobar pneumonia, unspecified organism: Secondary | ICD-10-CM

## 2016-04-16 DIAGNOSIS — R0602 Shortness of breath: Secondary | ICD-10-CM

## 2016-04-16 DIAGNOSIS — M129 Arthropathy, unspecified: Secondary | ICD-10-CM

## 2016-04-16 DIAGNOSIS — R0902 Hypoxemia: Secondary | ICD-10-CM

## 2016-04-16 LAB — BLOOD GAS, ARTERIAL
Acid-base deficit: 2.9 mmol/L — ABNORMAL HIGH (ref 0.0–2.0)
BICARBONATE: 20.1 mmol/L (ref 20.0–28.0)
DRAWN BY: 276051
O2 Content: 4 L/min
O2 Saturation: 86.6 %
PATIENT TEMPERATURE: 99.8
pCO2 arterial: 32.2 mmHg (ref 32.0–48.0)
pH, Arterial: 7.415 (ref 7.350–7.450)
pO2, Arterial: 55 mmHg — ABNORMAL LOW (ref 83.0–108.0)

## 2016-04-16 LAB — HIV ANTIBODY (ROUTINE TESTING W REFLEX): HIV Screen 4th Generation wRfx: NONREACTIVE

## 2016-04-16 MED ORDER — ENSURE ENLIVE PO LIQD
237.0000 mL | Freq: Two times a day (BID) | ORAL | Status: DC
  Administered 2016-04-17 – 2016-04-18 (×3): 237 mL via ORAL

## 2016-04-16 MED ORDER — PREDNISONE 20 MG PO TABS
20.0000 mg | ORAL_TABLET | Freq: Every day | ORAL | Status: DC
  Administered 2016-04-16 – 2016-04-18 (×3): 20 mg via ORAL
  Filled 2016-04-16 (×3): qty 1

## 2016-04-16 MED ORDER — BUDESONIDE 0.5 MG/2ML IN SUSP
0.5000 mg | Freq: Two times a day (BID) | RESPIRATORY_TRACT | Status: DC
  Administered 2016-04-16 – 2016-04-18 (×4): 0.5 mg via RESPIRATORY_TRACT
  Filled 2016-04-16 (×4): qty 2

## 2016-04-16 MED ORDER — FUROSEMIDE 10 MG/ML IJ SOLN
10.0000 mg | Freq: Once | INTRAMUSCULAR | Status: AC
  Administered 2016-04-16: 10 mg via INTRAVENOUS
  Filled 2016-04-16: qty 2

## 2016-04-16 MED ORDER — ARFORMOTEROL TARTRATE 15 MCG/2ML IN NEBU
15.0000 ug | INHALATION_SOLUTION | Freq: Two times a day (BID) | RESPIRATORY_TRACT | Status: DC
  Administered 2016-04-16 – 2016-04-18 (×4): 15 ug via RESPIRATORY_TRACT
  Filled 2016-04-16 (×4): qty 2

## 2016-04-16 NOTE — Clinical Social Work Placement (Signed)
[facility]. CSW has completed FL2 & will continue to follow and assist with discharge when ready.    Lincoln MaxinKelly Malakye Nolden, LCSW [facility] Clinical Social Worker cell #: [phone]     CLINICAL SOCIAL WORK PLACEMENT  NOTE  Date:  04/16/2016  Patient Details  Name: Shelby FireDawn L Owen MRN: [id number] Date of Birth: 1927/06/12  Clinical Social Work is seeking post-discharge placement for this patient at the Skilled  Nursing Facility level of care (*CSW will initial, date and re-position this form in  chart as items are completed):  Yes   Patient/family provided with [facility] Clinical Social Work Department's list of facilities offering this level of care within the geographic area requested by the patient (or if unable, by the patient's family).  Yes   Patient/family informed of their freedom to choose among providers that offer the needed level of care, that participate in Medicare, Medicaid or managed care program needed by the patient, have an available bed and are willing to accept the patient.  Yes   Patient/family informed of [place]'s ownership interest in [facility] and [facility], as well as of the fact that they are under no obligation to receive care at these facilities.  PASRR submitted to EDS on 04/15/16     PASRR number received on 04/15/16     Existing PASRR number confirmed on       FL2 transmitted to all facilities in geographic area requested by pt/family on 04/15/16     FL2 transmitted to all facilities within larger geographic area on       Patient informed that his/her managed care company has contracts with or will negotiate with certain facilities, including the following:        Yes   Patient/family informed of bed offers received.  [facility]     Physician recommends and patient chooses bed at      [facility] on  .  Patient to  be transferred to facility by       Patient family notified on   of transfer.  Name of family member notified:        PHYSICIAN       Additional Comment:    _______________________________________________ Arlyss RepressHarrison, Tiyana Galla F, LCSW 04/16/2016, 2:42 PM

## 2016-04-16 NOTE — Progress Notes (Addendum)
PROGRESS NOTE    [name]  ZHY:[id number] DOB: 1927-06-10 DOA: 04/14/2016 PCP: Enrique SackGREEN, EDWIN JAY, MD   Chief Complaint  Patient presents with  . Generalized Body Aches  . Headache    Brief Narrative:  HPI on 04/14/2016 by Dr. Rickey BarbaraStephen Chiu  [name] is a 83 y.o. female with medical history significant of afib on chronic anticoagulation who presents with two weeks of worsening malaise, muscle aches, "flu type symptoms" that got notably worse 2-3 days prior to hospital admission associated with subjective fevers. Patient subsequently presented to the ED for further work up. Family present in room reports concerns of rapid heart rate.  Assessment & Plan   Acute hypoxic respiratory failure secondary to community-acquired pneumonia -Patient's O2 sats noted to be 81% on room air in the emergency department -Chest x-ray showed new right upper lobe infiltrate -Continue supplemental oxygen to maintain saturations above 92%, nebulizer treatments, azithromycin and ceftriaxone -And the prednisone -Will discontinue IV fluids -Will give 1 dose of IV Lasix -Influenza PCR and respiratory viral panel Negative -Patient did have an elevated BNP on admission, 283.2. Echocardiogram showed EF 55-60%, no regional wall motion abnormalities, LV diastolic function not evaluated due to insufficient study  History of paroxysmal atrial fibrillation -Currently rate controlled -Cardiology consulted -Echocardiogram as above -Continue Eliquis -CHADSVASC at least 3 (gender and age)  Arthritis -Stable  Generalized weakness -Possibly due to pneumonia -PT consult and recommended SNF  Chronic kidney disease, stage III -Creatinine in 2012 1.6, currently 1.35 -Continue to monitor BMP  DVT Prophylaxis  Eliquis  Code Status: Full  Family Communication: None at bedside.  Disposition Plan: Admitted. May need SNF upon discharge.   Consultants Cardiology   Procedures  Echocardiogram  Antibiotics     Anti-infectives    Start     Dose/Rate Route Frequency Ordered Stop   04/15/16 1500  azithromycin (ZITHROMAX) 500 mg in dextrose 5 % 250 mL IVPB     500 mg 250 mL/hr over 60 Minutes Intravenous Every 24 hours 04/15/16 1314 04/22/16 1459   04/15/16 1400  cefTRIAXone (ROCEPHIN) 1 g in dextrose 5 % 50 mL IVPB     1 g 100 mL/hr over 30 Minutes Intravenous Every 24 hours 04/15/16 1314 04/22/16 1359      Subjective:   [name] seen and examined today.  Patient denies chest pain, abdominal pain, nausea or vomiting, diarrhea or constipation. Does feel very short winded. Does have dry cough.  Objective:   Vitals:   04/15/16 2204 04/16/16 0139 04/16/16 0609 04/16/16 1130  BP: (!) 104/59 118/75 94/64 128/80  Pulse: 78 75 66   Resp: 20  18   Temp: 98.1 F (36.7 C)  97.8 F (36.6 C)   TempSrc: Oral  Oral   SpO2: 95%  98%   Weight:      Height:        Intake/Output Summary (Last 24 hours) at 04/16/16 1208 Last data filed at 04/16/16 0700  Gross per 24 hour  Intake             2715 ml  Output              600 ml  Net             2115 ml   Filed Weights   04/14/16 1033 04/14/16 1825  Weight: 49 kg (108 lb) 48.5 kg (107 lb)    Exam  General: Well developed, well nourished, NAD, appears stated age  HEENT:  NCAT,mucous membranes moist.   Cardiovascular: S1 S2 auscultated, no rubs, murmurs or gallops. Regular rate and rhythm.  Respiratory: No wheezing. Increased work of breathing with sitting up during exam.    Abdomen: Soft, nontender, nondistended, + bowel sounds  Extremities: warm dry without cyanosis clubbing or edema  Neuro: AAOx3, nonfocal  Psych: Normal affect and demeanor with intact judgement and insight   Data Reviewed: I have personally reviewed following labs and imaging studies  CBC:  Recent Labs Lab 04/14/16 1212 04/15/16 0609  WBC 13.2* 9.9  NEUTROABS 9.8*  --   HGB 12.8 12.0  HCT 37.7 35.8*  MCV 93.1 91.3  PLT 317 298   Basic Metabolic  Panel:  Recent Labs Lab 04/14/16 1212 04/15/16 0609  NA 136 138  K 3.8 4.1  CL 104 108  CO2 22 21*  GLUCOSE 95 96  BUN 27* 16  CREATININE 1.55* 1.35*  CALCIUM 8.9 8.0*   GFR: Estimated Creatinine Clearance: 22.9 mL/min (by C-G formula based on SCr of 1.35 mg/dL (H)). Liver Function Tests:  Recent Labs Lab 04/15/16 0609  AST 18  ALT 23  ALKPHOS 40  BILITOT 0.9  PROT 5.8*  ALBUMIN 3.0*   No results for input(s): LIPASE, AMYLASE in the last 168 hours. No results for input(s): AMMONIA in the last 168 hours. Coagulation Profile: No results for input(s): INR, PROTIME in the last 168 hours. Cardiac Enzymes: No results for input(s): CKTOTAL, CKMB, CKMBINDEX, TROPONINI in the last 168 hours. BNP (last 3 results) No results for input(s): PROBNP in the last 8760 hours. HbA1C: No results for input(s): HGBA1C in the last 72 hours. CBG: No results for input(s): GLUCAP in the last 168 hours. Lipid Profile: No results for input(s): CHOL, HDL, LDLCALC, TRIG, CHOLHDL, LDLDIRECT in the last 72 hours. Thyroid Function Tests: No results for input(s): TSH, T4TOTAL, FREET4, T3FREE, THYROIDAB in the last 72 hours. Anemia Panel: No results for input(s): VITAMINB12, FOLATE, FERRITIN, TIBC, IRON, RETICCTPCT in the last 72 hours. Urine analysis:    Component Value Date/Time   COLORURINE STRAW (A) 04/14/2016 1345   APPEARANCEUR CLEAR 04/14/2016 1345   LABSPEC 1.003 (L) 04/14/2016 1345   PHURINE 6.0 04/14/2016 1345   GLUCOSEU NEGATIVE 04/14/2016 1345   HGBUR NEGATIVE 04/14/2016 1345   BILIRUBINUR NEGATIVE 04/14/2016 1345   KETONESUR NEGATIVE 04/14/2016 1345   PROTEINUR NEGATIVE 04/14/2016 1345   NITRITE NEGATIVE 04/14/2016 1345   LEUKOCYTESUR NEGATIVE 04/14/2016 1345   Sepsis Labs: @LABRCNTIP (procalcitonin:4,lacticidven:4)  ) Recent Results (from the past 240 hour(s))  Culture, blood (Routine X 2) w Reflex to ID Panel     Status: None (Preliminary result)   Collection Time:  04/14/16 12:12 PM  Result Value Ref Range Status   Specimen Description BLOOD RIGHT ANTECUBITAL  Final   Special Requests BOTTLES DRAWN AEROBIC AND ANAEROBIC 5CC  Final   Culture   Final    NO GROWTH 2 DAYS Performed at [facility]    Report Status PENDING  Incomplete  Culture, blood (Routine X 2) w Reflex to ID Panel     Status: None (Preliminary result)   Collection Time: 04/14/16 12:12 PM  Result Value Ref Range Status   Specimen Description BLOOD RIGHT WRIST  Final   Special Requests BOTTLES DRAWN AEROBIC AND ANAEROBIC  Final   Culture   Final    NO GROWTH 2 DAYS Performed at [facility]    Report Status PENDING  Incomplete  Urine culture     Status:  None   Collection Time: 04/14/16  1:45 PM  Result Value Ref Range Status   Specimen Description URINE, CLEAN CATCH  Final   Special Requests NONE  Final   Culture NO GROWTH Performed at [facility]   Final   Report Status 04/15/2016 FINAL  Final  Respiratory Panel by PCR     Status: None   Collection Time: 04/14/16  4:00 PM  Result Value Ref Range Status   Adenovirus NOT DETECTED NOT DETECTED Final   Coronavirus 229E NOT DETECTED NOT DETECTED Final   Coronavirus HKU1 NOT DETECTED NOT DETECTED Final   Coronavirus NL63 NOT DETECTED NOT DETECTED Final   Coronavirus OC43 NOT DETECTED NOT DETECTED Final   Metapneumovirus NOT DETECTED NOT DETECTED Final   Rhinovirus / Enterovirus NOT DETECTED NOT DETECTED Final   Influenza A NOT DETECTED NOT DETECTED Final   Influenza B NOT DETECTED NOT DETECTED Final   Parainfluenza Virus 1 NOT DETECTED NOT DETECTED Final   Parainfluenza Virus 2 NOT DETECTED NOT DETECTED Final   Parainfluenza Virus 3 NOT DETECTED NOT DETECTED Final   Parainfluenza Virus 4 NOT DETECTED NOT DETECTED Final   Respiratory Syncytial Virus NOT DETECTED NOT DETECTED Final   Bordetella pertussis NOT DETECTED NOT DETECTED Final   Chlamydophila pneumoniae NOT DETECTED NOT DETECTED Final    Mycoplasma pneumoniae NOT DETECTED NOT DETECTED Final    Comment: Performed at [facility]  Culture, blood (routine x 2) Call MD if unable to obtain prior to antibiotics being given     Status: None (Preliminary result)   Collection Time: 04/15/16  1:36 PM  Result Value Ref Range Status   Specimen Description BLOOD LEFT ARM  Final   Special Requests BOTTLES DRAWN AEROBIC AND ANAEROBIC 6CC  Final   Culture   Final    NO GROWTH < 24 HOURS Performed at [facility]    Report Status PENDING  Incomplete  Culture, blood (routine x 2) Call MD if unable to obtain prior to antibiotics being given     Status: None (Preliminary result)   Collection Time: 04/15/16  1:54 PM  Result Value Ref Range Status   Specimen Description BLOOD LEFT ARM  Final   Special Requests BOTTLES DRAWN AEROBIC AND ANAEROBIC 6CC  Final   Culture   Final    NO GROWTH < 24 HOURS Performed at [facility]    Report Status PENDING  Incomplete      Radiology Studies: Dg Chest Port 1 View  Result Date: 04/15/2016 CLINICAL DATA:  Chest pain and shortness of breath for 2 days EXAM: PORTABLE CHEST 1 VIEW COMPARISON:  04/14/2016 FINDINGS: Cardiac shadow is stable. Aortic calcifications are again seen. New right upper lobe infiltrate is noted which was not seen on the prior exam. No acute bony abnormality is noted. IMPRESSION: New right upper lobe infiltrate. Electronically Signed   By: Alcide Clever M.D.   On: 04/15/2016 11:06     Scheduled Meds: . apixaban  2.5 mg Oral BID  . aspirin EC  81 mg Oral Daily  . atorvastatin  5 mg Oral Daily  . azithromycin  500 mg Intravenous Q24H  . cefTRIAXone (ROCEPHIN)  IV  1 g Intravenous Q24H  . cholecalciferol  3,000 Units Oral Daily  . diltiazem  120 mg Oral BID  . feeding supplement (ENSURE ENLIVE)  237 mL Oral BID BM  . furosemide  10 mg Intravenous Once  . latanoprost  1 drop Both Eyes QHS  .  predniSONE  5 mg Oral Daily  . sodium chloride flush  3 mL  Intravenous Q12H   Continuous Infusions:   LOS: 1 day   Time Spent in minutes   45 minutes  Litzy Dicker D.O. on 04/16/2016 at 12:08 PM  Between 7am to 7pm - Pager - [phone]  After 7pm go to [email or web address] - password [email or web address]  And look for the night coverage person covering for me after hours  [facility] Office  [phone]

## 2016-04-16 NOTE — Progress Notes (Signed)
Initial Nutrition Assessment  DOCUMENTATION CODES:   Severe malnutrition in context of chronic illness  INTERVENTION:   Ensure Enlive po BID, each supplement provides 350 kcal and 20 grams of protein  NUTRITION DIAGNOSIS:   Malnutrition related to chronic illness, poor appetite as evidenced by severe depletion of body fat, severe depletion of muscle mass.  GOAL:   Patient will meet greater than or equal to 90% of their needs  MONITOR:   PO intake, Supplement acceptance  REASON FOR ASSESSMENT:   Other (Comment) (low BMI)    ASSESSMENT:   83 y.o. female with medical history significant of afib on chronic anticoagulation who presents with two weeks of worsening malaise, muscle aches, "flu type symptoms" that got notably worse 2-3 days prior to hospital admission associated with subjective fevers. Admitted for CAP   Met with pt in room today. Pt reports poor appetite for two weeks pta. Pt eating 50% meals currently and drinking 100% Ensures. Per pt, her weight is stable.   Medications reviewed and include: aspirin, zithromax, ceftriaxone, vit D, prednisone, tramadol  Labs reviewed: Co2 21(L), creat 1.35(H), Ca 8.0(L) adj. 8.8(L), Alb 3.0(L), BNP 283.3(H)  Nutrition-Focused physical exam completed. Findings are severe fat depletion, severe muscle depletion, and no edema.   Diet Order:  Diet Heart Room service appropriate? Yes; Fluid consistency: Thin  Skin:  Reviewed, no issues  Last BM:  1/7  Height:   Ht Readings from Last 1 Encounters:  04/14/16 5' 3.5" (1.613 m)    Weight:   Wt Readings from Last 1 Encounters:  04/14/16 107 lb (48.5 kg)    Ideal Body Weight:  53.6 kg  BMI:  Body mass index is 18.66 kg/m.  Estimated Nutritional Needs:   Kcal:  1400-1700kcal/day   Protein:  58-68g/day   Fluid:  >1.4L/day   EDUCATION NEEDS:   No education needs identified at this time  Koleen Distance, RD, LDN Pager #[phone]

## 2016-04-16 NOTE — Progress Notes (Signed)
OT Cancellation Note  Patient Details Name: [name] MRN: [id number] DOB: 1927-05-17   Cancelled Treatment:    Reason Eval/Treat Not Completed: Other (comment) Noted plan for SNF- [facility] [place], [place] [phone] Einar CrowEDDING, Devontay Celaya D 04/16/2016, 2:06 PM

## 2016-04-16 NOTE — Progress Notes (Signed)
Patient found to be quite dyspneic with resp 36 and using accessory muscles. O2 sat on 2l/min is down to 81%. O2 increased to 4l/min via nasal cannula and respiratory called for breathing treatment. MD notified and new orders obtained. Melton Alarana A Ericberto Padget, RN
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
## 2016-04-17 ENCOUNTER — Inpatient Hospital Stay (HOSPITAL_COMMUNITY): Payer: Medicare Other

## 2016-04-17 DIAGNOSIS — J189 Pneumonia, unspecified organism: Secondary | ICD-10-CM

## 2016-04-17 DIAGNOSIS — E43 Unspecified severe protein-calorie malnutrition: Secondary | ICD-10-CM

## 2016-04-17 DIAGNOSIS — J9601 Acute respiratory failure with hypoxia: Secondary | ICD-10-CM

## 2016-04-17 DIAGNOSIS — Z87891 Personal history of nicotine dependence: Secondary | ICD-10-CM

## 2016-04-17 LAB — PULMONARY FUNCTION TEST
FEF 25-75 Pre: 0.47 L/sec
FEF2575-%Pred-Pre: 45 %
FEV1-%PRED-PRE: 70 %
FEV1-PRE: 1.15 L
FEV1FVC-%Pred-Pre: 82 %
FEV6-%PRED-PRE: 90 %
FEV6-PRE: 1.89 L
FEV6FVC-%Pred-Pre: 105 %
FVC-%Pred-Pre: 86 %
FVC-Pre: 1.93 L
PRE FEV1/FVC RATIO: 60 %
Pre FEV6/FVC Ratio: 98 %

## 2016-04-17 LAB — BASIC METABOLIC PANEL
Anion gap: 8 (ref 5–15)
BUN: 18 mg/dL (ref 6–20)
CALCIUM: 8.5 mg/dL — AB (ref 8.9–10.3)
CO2: 23 mmol/L (ref 22–32)
CREATININE: 1.46 mg/dL — AB (ref 0.44–1.00)
Chloride: 103 mmol/L (ref 101–111)
GFR calc Af Amer: 36 mL/min — ABNORMAL LOW (ref >60)
GFR calc non Af Amer: 31 mL/min — ABNORMAL LOW (ref >60)
GLUCOSE: 147 mg/dL — AB (ref 65–99)
Potassium: 4.6 mmol/L (ref 3.5–5.1)
Sodium: 134 mmol/L — ABNORMAL LOW (ref 135–145)

## 2016-04-17 LAB — CBC
HEMATOCRIT: 35.6 % — AB (ref 36.0–46.0)
Hemoglobin: 11.8 g/dL — ABNORMAL LOW (ref 12.0–15.0)
MCH: 30.6 pg (ref 26.0–34.0)
MCHC: 33.1 g/dL (ref 30.0–36.0)
MCV: 92.5 fL (ref 78.0–100.0)
Platelets: 323 10*3/uL (ref 150–400)
RBC: 3.85 MIL/uL — ABNORMAL LOW (ref 3.87–5.11)
RDW: 14.7 % (ref 11.5–15.5)
WBC: 9.9 10*3/uL (ref 4.0–10.5)

## 2016-04-17 LAB — ROCKY MTN SPOTTED FVR ABS PNL(IGG+IGM)
RMSF IgG: NEGATIVE
RMSF IgM: 0.18 index (ref 0.00–0.89)

## 2016-04-17 MED ORDER — FUROSEMIDE 10 MG/ML IJ SOLN
10.0000 mg | Freq: Once | INTRAMUSCULAR | Status: AC
  Administered 2016-04-17: 10 mg via INTRAVENOUS
  Filled 2016-04-17: qty 2

## 2016-04-17 NOTE — Progress Notes (Signed)
Physical Therapy Treatment Patient Details Name: [name] MRN: [id number] DOB: 02-23-1928 Today's Date: 04/17/2016    History of Present Illness 83 y.o. female with medical history significant of afib on chronic anticoagulation who presents with two weeks of worsening malaise, muscle aches, "flu type symptoms" that got notably worse 2-3 days prior to hospital admission associated with subjective fevers.; xray = R UL infiltrate    PT Comments    Pt c/o LEs feeling weak on today. Dyspnea 3/4 with activity. O2 sats 83% on 3L O2 during ambulation on today. Plan is for ST rehab at SNF.   Follow Up Recommendations  SNF     Equipment Recommendations  Rolling walker with 5" wheels    Recommendations for Other Services       Precautions / Restrictions Precautions Precautions: Fall Precaution Comments: monitor O2 sats    Mobility  Bed Mobility                  Transfers Overall transfer level: Needs assistance   Transfers: Sit to/from Stand Sit to Stand: Min guard         General transfer comment: close guard for safety.   Ambulation/Gait Ambulation/Gait assistance: Min assist Ambulation Distance (Feet): 100 Feet Assistive device:  (hallway handrail) Gait Pattern/deviations: Step-through pattern;Decreased stride length     General Gait Details: VCs for pursed lip breathing. Assist to stabilize. Pt c/o bil LEs feeling weak. O2 sats 83% on 3L O2 during ambulation. Dyspnea 3/4   Stairs            Wheelchair Mobility    Modified Rankin (Stroke Patients Only)       Balance Overall balance assessment: Needs assistance           Standing balance-Leahy Scale: Fair                      Cognition Arousal/Alertness: Awake/alert Behavior During Therapy: WFL for tasks assessed/performed Overall Cognitive Status: Within Functional Limits for tasks assessed                      Exercises      General Comments         Pertinent Vitals/Pain Pain Assessment: No/denies pain    Home Living                      Prior Function            PT Goals (current goals can now be found in the care plan section) Progress towards PT goals: Progressing toward goals    Frequency    Min 3X/week      PT Plan Current plan remains appropriate    Co-evaluation             End of Session Equipment Utilized During Treatment: Gait belt;Oxygen Activity Tolerance: Patient tolerated treatment well Patient left: in chair;with call bell/phone within reach;with family/visitor present     Time: [id number] PT Time Calculation (min) (ACUTE ONLY): 10 min  Charges:  $Gait Training: 8-22 mins                    G Codes:      [name], MPT Pager: [phone]

## 2016-04-17 NOTE — Progress Notes (Signed)
PCCM Progress Note  Admission date: 04/14/2016 Consult date: 04/16/2016 Referring provider: Dr. Catha Gosselin  CC: Short of breath  HPI: 83 yo female former smoker presented with HA, body aches, chills, subjective fever, dyspnea, hypoxia, tachycardia for two weeks.  Found to have CAP and A fib with RVR.  PMHx of PAF, Glaucoma, HLD, PMR  Subjective: Breathing better.  Denies sinus congestion, sore throat, chest pain.  Has cough, but not bringing up sputum.  Denies wheezing.  Vital signs: BP 92/68 (BP Location: Left Arm)   Pulse 72   Temp 98 F (36.7 C) (Oral)   Resp 20   Ht 5' 3.5" (1.613 m)   Wt 107 lb (48.5 kg)   SpO2 95%   BMI 18.66 kg/m   Intake/output: I/O last 3 completed shifts: In: 1440 [P.O.:540; I.V.:900] Out: 2175 [Urine:2175]  General: pleasant, sitting in chair Neuro: alert, normal strength HEENT: no sinus tenderness, no stridor Cardiac: regular, no murmur Chest: faint crackles RUL field, no wheeze Abd: soft, non tender Ext: no edema Skin: no rashes   CMP Latest Ref Rng & Units 04/17/2016 04/15/2016 04/14/2016  Glucose 65 - 99 mg/dL 440(N) 96 95  BUN 6 - 20 mg/dL 18 16 02(V)  Creatinine 0.44 - 1.00 mg/dL 2.53(G) 6.44(I) 3.47(Q)  Sodium 135 - 145 mmol/L 134(L) 138 136  Potassium 3.5 - 5.1 mmol/L 4.6 4.1 3.8  Chloride 101 - 111 mmol/L 103 108 104  CO2 22 - 32 mmol/L 23 21(L) 22  Calcium 8.9 - 10.3 mg/dL 2.5(Z) 5.6(L) 8.9  Total Protein 6.5 - 8.1 g/dL - 5.8(L) -  Total Bilirubin 0.3 - 1.2 mg/dL - 0.9 -  Alkaline Phos 38 - 126 U/L - 40 -  AST 15 - 41 U/L - 18 -  ALT 14 - 54 U/L - 23 -     CBC Latest Ref Rng & Units 04/17/2016 04/15/2016 04/14/2016  WBC 4.0 - 10.5 K/uL 9.9 9.9 13.2(H)  Hemoglobin 12.0 - 15.0 g/dL 11.8(L) 12.0 12.8  Hematocrit 36.0 - 46.0 % 35.6(L) 35.8(L) 37.7  Platelets 150 - 400 K/uL 323 298 317     ABG    Component Value Date/Time   PHART 7.415 04/16/2016 1403   PCO2ART 32.2 04/16/2016 1403   PO2ART 55.0 (L) 04/16/2016 1403   HCO3 20.1  04/16/2016 1403   TCO2 25 05/24/2010 0709   ACIDBASEDEF 2.9 (H) 04/16/2016 1403   O2SAT 86.6 04/16/2016 1403     CBG (last 3)  No results for input(s): GLUCAP in the last 72 hours.   Imaging: Dg Chest Port 1 View  Result Date: 04/15/2016 CLINICAL DATA:  Chest pain and shortness of breath for 2 days EXAM: PORTABLE CHEST 1 VIEW COMPARISON:  04/14/2016 FINDINGS: Cardiac shadow is stable. Aortic calcifications are again seen. New right upper lobe infiltrate is noted which was not seen on the prior exam. No acute bony abnormality is noted. IMPRESSION: New right upper lobe infiltrate. Electronically Signed   By: Alcide Clever M.D.   On: 04/15/2016 11:06    Studies: Echo 1/09 >> EF 55 to 60%, PAS 33 mmHg  Antibiotics: Rocephin 1/09 >> Zithromax 1/09 >>  Cultures: Influenza PCR 1/08 >> negative Blood 1/08 >> Urine 1/08 >> negative Respiratory viral panel 1/08 >> negative Blood 1/09 >> Pneumococcal Ag 1/09 >> negative  Lines/tubes:  Events: 1/08 Admit 1/09 Cardiology consulted  Summary: 83 yo female with acute hypoxic respiratory failure from CAP.  She has hx of tobacco abuse and concern for possible COPD.  Assessment/plan:  Acute hypoxic respiratory failure 2nd to CAP. - day 3 of Abx - f/u CXR - oxygen to keep SpO2 > 92%  Extensive hx of smoking with concern for COPD. - f/u spirometry - continue brovana, pulmicort - wean off prednisone as tolerated  A fib with RVR. - per cardiology and primary team  DVT prophylaxis - eliquis SUP - not indicated Nutrition - Regular diet Goals of care - Full code  D/w Dr. Austin MilesMikhail  Nike Southwell, MD [facility] 04/17/2016, 9:37 AM Pager:  [phone] After 3pm call: [phone]

## 2016-04-17 NOTE — Progress Notes (Addendum)
PROGRESS NOTE    [name]  ZOX:[id number] DOB: 1927/09/16 DOA: 04/14/2016 PCP: Enrique SackGREEN, EDWIN JAY, MD   Chief Complaint  Patient presents with  . Generalized Body Aches  . Headache    Brief Narrative:  HPI on 04/14/2016 by Dr. Rickey BarbaraStephen Chiu  [name] is a 83 y.o. female with medical history significant of afib on chronic anticoagulation who presents with two weeks of worsening malaise, muscle aches, "flu type symptoms" that got notably worse 2-3 days prior to hospital admission associated with subjective fevers. Patient subsequently presented to the ED for further work up. Family present in room reports concerns of rapid heart rate.  Assessment & Plan   Acute hypoxic respiratory failure secondary to community-acquired pneumonia -Patient's O2 sats noted to be 81% on room air in the emergency department -Chest x-ray showed new right upper lobe infiltrate -Continue supplemental oxygen to maintain saturations above 92%, nebulizer treatments, azithromycin and ceftriaxone -Continue prednisone, dose increased to 20mg  daily- will wean back to 5mg  when possible -Discontinued IV fluids -Was given 1 dose of IV Lasix -Influenza PCR and respiratory viral panel Negative -Patient did have an elevated BNP on admission, 283.2. Echocardiogram showed EF 55-60%, no regional wall motion abnormalities, LV diastolic function not evaluated due to insufficient study -Pulmonology consulted and appreciated. Continue brovana, pulmicort. Follow spirometry -repeat CXR showed B/L pleural effusions, with clearing of RUL infiltrate -Will give additional dose of lasix and continue to monitor closely -Will attempt to wean oxygen today, currently on 4L  History of paroxysmal atrial fibrillation -Currently rate controlled -Cardiology consulted -Echocardiogram as above -Continue Eliquis -CHADSVASC at least 3 (gender and age)  Arthritis -Stable  Generalized weakness -Possibly due to pneumonia -PT consult  and recommended SNF  Chronic kidney disease, stage III -Creatinine in 2012 1.6, currently 1.46 -Continue to monitor BMP  Possible Diastolic CHF exacerbation -As above -CXR finding show pleural effusions -extra dose of lasix given today  Severe calorie malnutrition -nutrition consulted, continue supplements  DVT Prophylaxis  Eliquis  Code Status: Full  Family Communication: None at bedside.  Disposition Plan: Admitted. May need SNF upon discharge.   Consultants Cardiology   Procedures  Echocardiogram  Antibiotics   Anti-infectives    Start     Dose/Rate Route Frequency Ordered Stop   04/15/16 1500  azithromycin (ZITHROMAX) 500 mg in dextrose 5 % 250 mL IVPB     500 mg 250 mL/hr over 60 Minutes Intravenous Every 24 hours 04/15/16 1314 04/22/16 1459   04/15/16 1400  cefTRIAXone (ROCEPHIN) 1 g in dextrose 5 % 50 mL IVPB     1 g 100 mL/hr over 30 Minutes Intravenous Every 24 hours 04/15/16 1314 04/22/16 1359      Subjective:   [name] seen and examined today.  Patient feeling breathing has improved. Continues to have shortness of breath with movement or exertion. Feels cough is mild, if any.  Patient denies chest pain, abdominal pain, nausea or vomiting, diarrhea or constipation.   Objective:   Vitals:   04/16/16 2300 04/17/16 0511 04/17/16 0805 04/17/16 1025  BP: 105/69 92/68  107/64  Pulse: 79 72    Resp: (!) 22 20    Temp: 98.1 F (36.7 C) 98 F (36.7 C)    TempSrc: Oral Oral    SpO2: 95% 96% 95%   Weight:      Height:        Intake/Output Summary (Last 24 hours) at 04/17/16 1041 Last data filed at 04/17/16 [phone]  Gross per 24 hour  Intake              480 ml  Output             1875 ml  Net            -1395 ml   Filed Weights   04/14/16 1033 04/14/16 1825  Weight: 49 kg (108 lb) 48.5 kg (107 lb)    Exam  General: Well developed, well nourished, NAD, appears stated age  HEENT: NCAT,mucous membranes moist.   Cardiovascular: S1 S2  auscultated, no rubs, murmurs or gallops. Regular rate and rhythm.  Respiratory: Faint crackles, no wheezing, better air movement day. Less work of breathing.  Abdomen: Soft, nontender, nondistended, + bowel sounds  Extremities: warm dry without cyanosis clubbing or edema  Neuro: AAOx3, nonfocal  Psych: Normal affect and demeanor with intact judgement and insight, pleasant   Data Reviewed: I have personally reviewed following labs and imaging studies  CBC:  Recent Labs Lab 04/14/16 1212 04/15/16 0609 04/17/16 0526  WBC 13.2* 9.9 9.9  NEUTROABS 9.8*  --   --   HGB 12.8 12.0 11.8*  HCT 37.7 35.8* 35.6*  MCV 93.1 91.3 92.5  PLT 317 298 323   Basic Metabolic Panel:  Recent Labs Lab 04/14/16 1212 04/15/16 0609 04/17/16 0526  NA 136 138 134*  K 3.8 4.1 4.6  CL 104 108 103  CO2 22 21* 23  GLUCOSE 95 96 147*  BUN 27* 16 18  CREATININE 1.55* 1.35* 1.46*  CALCIUM 8.9 8.0* 8.5*   GFR: Estimated Creatinine Clearance: 21.2 mL/min (by C-G formula based on SCr of 1.46 mg/dL (H)). Liver Function Tests:  Recent Labs Lab 04/15/16 0609  AST 18  ALT 23  ALKPHOS 40  BILITOT 0.9  PROT 5.8*  ALBUMIN 3.0*   No results for input(s): LIPASE, AMYLASE in the last 168 hours. No results for input(s): AMMONIA in the last 168 hours. Coagulation Profile: No results for input(s): INR, PROTIME in the last 168 hours. Cardiac Enzymes: No results for input(s): CKTOTAL, CKMB, CKMBINDEX, TROPONINI in the last 168 hours. BNP (last 3 results) No results for input(s): PROBNP in the last 8760 hours. HbA1C: No results for input(s): HGBA1C in the last 72 hours. CBG: No results for input(s): GLUCAP in the last 168 hours. Lipid Profile: No results for input(s): CHOL, HDL, LDLCALC, TRIG, CHOLHDL, LDLDIRECT in the last 72 hours. Thyroid Function Tests: No results for input(s): TSH, T4TOTAL, FREET4, T3FREE, THYROIDAB in the last 72 hours. Anemia Panel: No results for input(s): VITAMINB12,  FOLATE, FERRITIN, TIBC, IRON, RETICCTPCT in the last 72 hours. Urine analysis:    Component Value Date/Time   COLORURINE STRAW (A) 04/14/2016 1345   APPEARANCEUR CLEAR 04/14/2016 1345   LABSPEC 1.003 (L) 04/14/2016 1345   PHURINE 6.0 04/14/2016 1345   GLUCOSEU NEGATIVE 04/14/2016 1345   HGBUR NEGATIVE 04/14/2016 1345   BILIRUBINUR NEGATIVE 04/14/2016 1345   KETONESUR NEGATIVE 04/14/2016 1345   PROTEINUR NEGATIVE 04/14/2016 1345   NITRITE NEGATIVE 04/14/2016 1345   LEUKOCYTESUR NEGATIVE 04/14/2016 1345   Sepsis Labs: @LABRCNTIP (procalcitonin:4,lacticidven:4)  ) Recent Results (from the past 240 hour(s))  Culture, blood (Routine X 2) w Reflex to ID Panel     Status: None (Preliminary result)   Collection Time: 04/14/16 12:12 PM  Result Value Ref Range Status   Specimen Description BLOOD RIGHT ANTECUBITAL  Final   Special Requests BOTTLES DRAWN AEROBIC AND ANAEROBIC 5CC  Final   Culture  Final    NO GROWTH 2 DAYS Performed at [facility]    Report Status PENDING  Incomplete  Culture, blood (Routine X 2) w Reflex to ID Panel     Status: None (Preliminary result)   Collection Time: 04/14/16 12:12 PM  Result Value Ref Range Status   Specimen Description BLOOD RIGHT WRIST  Final   Special Requests BOTTLES DRAWN AEROBIC AND ANAEROBIC  Final   Culture   Final    NO GROWTH 2 DAYS Performed at [facility]    Report Status PENDING  Incomplete  Urine culture     Status: None   Collection Time: 04/14/16  1:45 PM  Result Value Ref Range Status   Specimen Description URINE, CLEAN CATCH  Final   Special Requests NONE  Final   Culture NO GROWTH Performed at [facility]   Final   Report Status 04/15/2016 FINAL  Final  Respiratory Panel by PCR     Status: None   Collection Time: 04/14/16  4:00 PM  Result Value Ref Range Status   Adenovirus NOT DETECTED NOT DETECTED Final   Coronavirus 229E NOT DETECTED NOT DETECTED Final   Coronavirus HKU1 NOT DETECTED  NOT DETECTED Final   Coronavirus NL63 NOT DETECTED NOT DETECTED Final   Coronavirus OC43 NOT DETECTED NOT DETECTED Final   Metapneumovirus NOT DETECTED NOT DETECTED Final   Rhinovirus / Enterovirus NOT DETECTED NOT DETECTED Final   Influenza A NOT DETECTED NOT DETECTED Final   Influenza B NOT DETECTED NOT DETECTED Final   Parainfluenza Virus 1 NOT DETECTED NOT DETECTED Final   Parainfluenza Virus 2 NOT DETECTED NOT DETECTED Final   Parainfluenza Virus 3 NOT DETECTED NOT DETECTED Final   Parainfluenza Virus 4 NOT DETECTED NOT DETECTED Final   Respiratory Syncytial Virus NOT DETECTED NOT DETECTED Final   Bordetella pertussis NOT DETECTED NOT DETECTED Final   Chlamydophila pneumoniae NOT DETECTED NOT DETECTED Final   Mycoplasma pneumoniae NOT DETECTED NOT DETECTED Final    Comment: Performed at [facility]  Culture, blood (routine x 2) Call MD if unable to obtain prior to antibiotics being given     Status: None (Preliminary result)   Collection Time: 04/15/16  1:36 PM  Result Value Ref Range Status   Specimen Description BLOOD LEFT ARM  Final   Special Requests BOTTLES DRAWN AEROBIC AND ANAEROBIC 6CC  Final   Culture   Final    NO GROWTH < 24 HOURS Performed at [facility]    Report Status PENDING  Incomplete  Culture, blood (routine x 2) Call MD if unable to obtain prior to antibiotics being given     Status: None (Preliminary result)   Collection Time: 04/15/16  1:54 PM  Result Value Ref Range Status   Specimen Description BLOOD LEFT ARM  Final   Special Requests BOTTLES DRAWN AEROBIC AND ANAEROBIC 6CC  Final   Culture   Final    NO GROWTH < 24 HOURS Performed at [facility]    Report Status PENDING  Incomplete      Radiology Studies: Dg Chest 2 View  Result Date: 04/17/2016 CLINICAL DATA:  Shortness of breath.  Pneumonia . EXAM: CHEST  2 VIEW COMPARISON:  04/16/2015 . FINDINGS: Mediastinum and hilar structures normal. COPD. Low lung volumes with mild  bibasilar atelectasis. Interim near complete clearing of right upper lobe infiltrate. Small bilateral pleural effusions. Heart size normal. No acute bony abnormality . IMPRESSION: 1. Low lung volumes  with mild bibasilar atelectasis and bilateral pleural effusions. Interim near complete clearing of right upper lobe infiltrate. 2. COPD. Electronically Signed   By: Maisie Fus  Register   On: 04/17/2016 10:11   Dg Chest Port 1 View  Result Date: 04/15/2016 CLINICAL DATA:  Chest pain and shortness of breath for 2 days EXAM: PORTABLE CHEST 1 VIEW COMPARISON:  04/14/2016 FINDINGS: Cardiac shadow is stable. Aortic calcifications are again seen. New right upper lobe infiltrate is noted which was not seen on the prior exam. No acute bony abnormality is noted. IMPRESSION: New right upper lobe infiltrate. Electronically Signed   By: Alcide Clever M.D.   On: 04/15/2016 11:06     Scheduled Meds: . apixaban  2.5 mg Oral BID  . arformoterol  15 mcg Nebulization BID  . aspirin EC  81 mg Oral Daily  . atorvastatin  5 mg Oral Daily  . azithromycin  500 mg Intravenous Q24H  . budesonide (PULMICORT) nebulizer solution  0.5 mg Nebulization BID  . cefTRIAXone (ROCEPHIN)  IV  1 g Intravenous Q24H  . cholecalciferol  3,000 Units Oral Daily  . diltiazem  120 mg Oral BID  . feeding supplement (ENSURE ENLIVE)  237 mL Oral BID BM  . latanoprost  1 drop Both Eyes QHS  . predniSONE  20 mg Oral Daily  . sodium chloride flush  3 mL Intravenous Q12H   Continuous Infusions:   LOS: 2 days   Time Spent in minutes   45 minutes  Alysah Carton D.O. on 04/17/2016 at 10:41 AM  Between 7am to 7pm - Pager - [phone]  After 7pm go to [email or web address] - password [email or web address]  And look for the night coverage person covering for me after hours  [facility] Office  [phone]

## 2016-04-18 DIAGNOSIS — J439 Emphysema, unspecified: Secondary | ICD-10-CM

## 2016-04-18 DIAGNOSIS — J189 Pneumonia, unspecified organism: Secondary | ICD-10-CM

## 2016-04-18 HISTORY — DX: Pneumonia, unspecified organism: J18.9

## 2016-04-18 LAB — BASIC METABOLIC PANEL
ANION GAP: 10 (ref 5–15)
BUN: 26 mg/dL — ABNORMAL HIGH (ref 6–20)
CALCIUM: 9 mg/dL (ref 8.9–10.3)
CO2: 26 mmol/L (ref 22–32)
CREATININE: 1.35 mg/dL — AB (ref 0.44–1.00)
Chloride: 105 mmol/L (ref 101–111)
GFR, EST AFRICAN AMERICAN: 40 mL/min — AB (ref >60)
GFR, EST NON AFRICAN AMERICAN: 34 mL/min — AB (ref >60)
GLUCOSE: 101 mg/dL — AB (ref 65–99)
Potassium: 3.9 mmol/L (ref 3.5–5.1)
Sodium: 141 mmol/L (ref 135–145)

## 2016-04-18 MED ORDER — CEFUROXIME AXETIL 500 MG PO TABS
500.0000 mg | ORAL_TABLET | Freq: Once | ORAL | Status: AC
  Administered 2016-04-18: 500 mg via ORAL
  Filled 2016-04-18: qty 1

## 2016-04-18 MED ORDER — CEFUROXIME AXETIL 500 MG PO TABS: 500.0000 mg | ORAL_TABLET | Freq: Two times a day (BID) | ORAL | 0 refills | Status: DC

## 2016-04-18 MED ORDER — AZITHROMYCIN 250 MG PO TABS
500.0000 mg | ORAL_TABLET | Freq: Once | ORAL | Status: AC
  Administered 2016-04-18: 500 mg via ORAL
  Filled 2016-04-18: qty 2

## 2016-04-18 MED ORDER — MOMETASONE FURO-FORMOTEROL FUM 100-5 MCG/ACT IN AERO: 2.0000 | INHALATION_SPRAY | Freq: Two times a day (BID) | RESPIRATORY_TRACT | 1 refills | Status: DC

## 2016-04-18 MED ORDER — ENSURE ENLIVE PO LIQD: 237.0000 mL | Freq: Two times a day (BID) | ORAL | 12 refills | Status: DC

## 2016-04-18 MED ORDER — FLUTICASONE-SALMETEROL 250-50 MCG/DOSE IN AEPB: 1.0000 | INHALATION_SPRAY | Freq: Two times a day (BID) | RESPIRATORY_TRACT | 1 refills | Status: DC

## 2016-04-18 MED ORDER — PREDNISONE 5 MG PO TABS: ORAL_TABLET | ORAL | 0 refills | Status: DC

## 2016-04-18 MED ORDER — AZITHROMYCIN 500 MG PO TABS: 500.0000 mg | ORAL_TABLET | Freq: Every day | ORAL | 0 refills | Status: AC

## 2016-04-18 NOTE — Progress Notes (Signed)
PCCM Progress Note  Admission date: 04/14/2016 Consult date: 04/16/2016 Referring provider: Dr. [name]  CC: Short of breath  HPI: 83 yo female former smoker presented with HA, body aches, chills, subjective fever, dyspnea, hypoxia, tachycardia for two weeks.  Found to have CAP and A fib with RVR.  PMHx of PAF, Glaucoma, HLD, PMR  Subjective: She feels like nebulizer treatments help.  Her husband reports that she has been getting more short of breath for the past several months.  She denies chest pain, and not bringing up sputum.  Still has some cough.  Vital signs: BP 108/65 (BP Location: Left Arm)   Pulse 82   Temp 98 F (36.7 C) (Oral)   Resp 16   Ht 5' 3.5" (1.613 m)   Wt 107 lb (48.5 kg)   SpO2 91%   BMI 18.66 kg/m   Intake/output: I/O last 3 completed shifts: In: 480 [P.O.:480] Out: 2400 [Urine:2400]  General: sitting up in bed Neuro: normal strength Eyes: pupils reactive ENT: no sinus tenderness, no stridor Cardiac: regular, no murmur Chest: better air movement, no wheeze Abd: soft, nontender Ext: no edema Skin: no rashes   CMP Latest Ref Rng & Units 04/18/2016 04/17/2016 04/15/2016  Glucose 65 - 99 mg/dL 098(J101(H) 191(Y147(H) 96  BUN 6 - 20 mg/dL 78(G26(H) 18 16  Creatinine 0.44 - 1.00 mg/dL 9.56(O1.35(H) 1.30(Q1.46(H) 6.57(Q1.35(H)  Sodium 135 - 145 mmol/L 141 134(L) 138  Potassium 3.5 - 5.1 mmol/L 3.9 4.6 4.1  Chloride 101 - 111 mmol/L 105 103 108  CO2 22 - 32 mmol/L 26 23 21(L)  Calcium 8.9 - 10.3 mg/dL 9.0 4.6(N8.5(L) 6.2(X8.0(L)  Total Protein 6.5 - 8.1 g/dL - - 5.8(L)  Total Bilirubin 0.3 - 1.2 mg/dL - - 0.9  Alkaline Phos 38 - 126 U/L - - 40  AST 15 - 41 U/L - - 18  ALT 14 - 54 U/L - - 23     CBC Latest Ref Rng & Units 04/17/2016 04/15/2016 04/14/2016  WBC 4.0 - 10.5 K/uL 9.9 9.9 13.2(H)  Hemoglobin 12.0 - 15.0 g/dL 11.8(L) 12.0 12.8  Hematocrit 36.0 - 46.0 % 35.6(L) 35.8(L) 37.7  Platelets 150 - 400 K/uL 323 298 317     ABG    Component Value Date/Time   PHART 7.415 04/16/2016  1403   PCO2ART 32.2 04/16/2016 1403   PO2ART 55.0 (L) 04/16/2016 1403   HCO3 20.1 04/16/2016 1403   TCO2 25 05/24/2010 0709   ACIDBASEDEF 2.9 (H) 04/16/2016 1403   O2SAT 86.6 04/16/2016 1403    Imaging: Dg Chest 2 View  Result Date: 04/17/2016 CLINICAL DATA:  Shortness of breath.  Pneumonia . EXAM: CHEST  2 VIEW COMPARISON:  04/16/2015 . FINDINGS: Mediastinum and hilar structures normal. COPD. Low lung volumes with mild bibasilar atelectasis. Interim near complete clearing of right upper lobe infiltrate. Small bilateral pleural effusions. Heart size normal. No acute bony abnormality . IMPRESSION: 1. Low lung volumes with mild bibasilar atelectasis and bilateral pleural effusions. Interim near complete clearing of right upper lobe infiltrate. 2. COPD. Electronically Signed   By: [name]  Register   On: 04/17/2016 10:11    Studies: Echo 1/09 >> EF 55 to 60%, PAS 33 mmHg Spirometry 1/11 >> FVC 1.93 (86%), FEV1% 1.15 (70%), FEV1% 60  Antibiotics: Rocephin 1/09 >> Zithromax 1/09 >>  Cultures: Influenza PCR 1/08 >> negative Blood 1/08 >> Urine 1/08 >> negative Respiratory viral panel 1/08 >> negative Blood 1/09 >> Pneumococcal Ag 1/09 >> negative  Lines/tubes:  Events:  1/08 Admit 1/09 Cardiology consulted  Summary: 83 yo female with acute hypoxic respiratory failure from CAP.  She has hx of tobacco abuse and concern for possible COPD.  Assessment/plan:  Acute hypoxic respiratory failure 2nd to CAP. - CXR improved >> follow up as outpt day 4 of Abx - oxygen to keep SpO2 > 92%  Extensive hx of smoking with concern for COPD. - spirometry consistent with mild obstructive disease - continue brovana, pulmicort >> can transition to LABA/ICS inhaler once ready for discharge >> choice depends on her insurance coverage - wean off prednisone as tolerated  A fib with RVR. - eliquis per primary team  Updated pt's husband at bedside.  I have her scheduled for pulmonary office  follow up on May 09, 2016 at 9 am.  Please call if further help needed while she is in hospital.  Coralyn Helling, MD [facility] Pulmonary/Critical Care 04/18/2016, 9:32 AM Pager:  [phone] After 3pm call: [phone]

## 2016-04-18 NOTE — Progress Notes (Signed)
Discharge instructions were reviewed with patient and family (husband and son in law). No concerns at this time. Report called to nurse at [facility] at 13:36

## 2016-04-18 NOTE — Discharge Summary (Addendum)
Physician Discharge Summary  [name] ZOX:[id number] DOB: 06/07/1927 DOA: 04/14/2016  PCP: Enrique Sack, MD  Admit date: 04/14/2016 Discharge date: 04/18/2016  Time spent: 45 minutes  Recommendations for Outpatient Follow-up:  Patient will be discharged to skilled nursing facility.  Continue supplemental oxygen.  Continue physical and occupational therapy.  Patient will need to follow up with primary care provider within one week of discharge.  Follow up with pulmonology on 05/09/2016 at 9am.  Patient should continue medications as prescribed.  Patient should follow a regular diet.   Discharge Diagnoses:  Acute hypoxic respiratory failure secondary to community-acquired pneumonia History of paroxysmal atrial fibrillation Arthritis Generalized weakness Chronic kidney disease, stage III Possible Diastolic CHF exacerbation Severe calorie malnutrition  Discharge Condition: stable  Diet recommendation: regular  Filed Weights   04/14/16 1033 04/14/16 1825  Weight: 49 kg (108 lb) 48.5 kg (107 lb)    History of present illness:  on 04/14/2016 by Dr. Rickey Barbara  Shelby L Gilmoreis a 83 y.o.femalewith medical history significant of afib on chronic anticoagulation who presents with two weeks of worsening malaise, muscle aches, "flu type symptoms" that got notably worse 2-3 days prior to hospital admission associated with subjective fevers. Patient subsequently presented to the ED for further work up. Family present in room reports concerns of rapid heart rate.   Hospital Course:  Acute hypoxic respiratory failure secondary to community-acquired pneumonia -Patient's O2 sats noted to be 81% on room air in the emergency department -Chest x-ray showed new right upper lobe infiltrate -Continue supplemental oxygen to maintain saturations above 92%, nebulizer treatments, azithromycin and ceftriaxone -Continue prednisone, dose increased to 20mg  daily- will wean back to 5mg  when  possible -Discontinued IV fluids -Was given lasix -Influenza PCR and respiratory viral panel Negative -Patient did have an elevated BNP on admission, 283.2. Echocardiogram showed EF 55-60%, no regional wall motion abnormalities, LV diastolic function not evaluated due to insufficient study -Pulmonology consulted and appreciated. Continue brovana, pulmicort. Spirometry shows mild obstructive disease.  -repeat CXR showed B/L pleural effusions, with clearing of RUL infiltrate -Currently requiring 4L of oxygen. O2 sats dropped to 83% on 3L while ambulating.  Suspect that as patient's pneumonia improves, her need for supplemental O2 will improve as well.  -Will discharge patient with Advair, oxgyen, prednisone taper, azithromycin and ceftin.  -Follow up appointment with pulmonology 05/09/2016.  History of paroxysmal atrial fibrillation -Currently rate controlled -Cardiology consulted -Echocardiogram as above -Continue Eliquis -CHADSVASC at least 3 (gender and age)  Arthritis -Stable  Generalized weakness -Possibly due to pneumonia -PT consult and recommended SNF  Chronic kidney disease, stage III -Creatinine in 2012 1.6, currently 1.35 -Continue to monitor BMP  Possible Diastolic CHF exacerbation -As above -CXR finding show pleural effusions -given lasix  Severe calorie malnutrition -nutrition consulted, continue supplements  Consultants Cardiology  Pulmonary   Procedures  Echocardiogram  Discharge Exam: Vitals:   04/17/16 2057 04/18/16 0440  BP: 100/71 108/65  Pulse: 86 82  Resp: 18 16  Temp: 98.8 F (37.1 C) 98 F (36.7 C)   Patient feeling breathing has improved. Continues to have shortness of breath with movement or exertion.  Patient denies chest pain, abdominal pain, nausea or vomiting, diarrhea or constipation.   Exam  General: Well developed, well nourished, NAD, appears stated age  HEENT: NCAT,mucous membranes moist.   Cardiovascular: S1 S2  auscultated, no rubs, murmurs or gallops. Regular rate and rhythm.  Respiratory: Diminished but clear, better air movement today. Less work of breathing.  No wheezing or crackles.  Abdomen: Soft, nontender, nondistended, + bowel sounds  Extremities: warm dry without cyanosis clubbing or edema  Neuro: AAOx3, nonfocal  Psych: Normal affect and demeanor with intact judgement and insight, pleasant  Discharge Instructions Discharge Instructions    Discharge instructions    Complete by:  As directed    Patient will be discharged to skilled nursing facility.  Continue supplemental oxygen.  Continue physical and occupational therapy.  Patient will need to follow up with primary care provider within one week of discharge.  Follow up with pulmonology on 05/09/2016 at 9am.  Patient should continue medications as prescribed.  Patient should follow a regular diet.     Current Discharge Medication List    START taking these medications   Details  azithromycin (ZITHROMAX) 500 MG tablet Take 1 tablet (500 mg total) by mouth daily. Qty: 3 tablet, Refills: 0    cefUROXime (CEFTIN) 500 MG tablet Take 1 tablet (500 mg total) by mouth 2 (two) times daily with a meal. Qty: 6 tablet, Refills: 0    feeding supplement, ENSURE ENLIVE, (ENSURE ENLIVE) LIQD Take 237 mLs by mouth 2 (two) times daily between meals. Qty: 237 mL, Refills: 12    mometasone-formoterol (DULERA) 100-5 MCG/ACT AERO Inhale 2 puffs into the lungs 2 (two) times daily. Qty: 13 g, Refills: 1      CONTINUE these medications which have CHANGED   Details  predniSONE (DELTASONE) 5 MG tablet Take 4 tabs x 2 days, then 3 tabs x 2 days, then 2 tabs x2 days, then 1 tab thereafter (this will be your 5mg  dose) Qty: 40 tablet, Refills: 0      CONTINUE these medications which have NOT CHANGED   Details  aspirin 81 MG tablet Take 81 mg by mouth daily.    atorvastatin (LIPITOR) 10 MG tablet Take 5 mg by mouth daily.     cholecalciferol  (VITAMIN D) 1000 UNITS tablet Take 3,000 Units by mouth daily.    diltiazem (CARDIZEM CD) 120 MG 24 hr capsule Take 120 mg by mouth 2 (two) times daily.    ELIQUIS 2.5 MG TABS tablet Take 2.5 mg by mouth 2 (two) times daily.    LUMIGAN 0.01 % SOLN Place 1 drop into the left eye at bedtime.     naproxen sodium (ANAPROX) 220 MG tablet Take 220 mg by mouth daily as needed for pain.    traMADol-acetaminophen (ULTRACET) 37.5-325 MG per tablet Take 1-2 tablets by mouth 2 (two) times daily as needed for moderate pain.     TRAVATAN Z 0.004 % SOLN ophthalmic solution Place 1 drop into both eyes at bedtime.     vitamin E 1000 UNIT capsule Take 2,000 Units by mouth daily.      STOP taking these medications     Na Sulfate-K Sulfate-Mg Sulf (SUPREP BOWEL PREP) SOLN        Allergies  Allergen Reactions  . Codeine Sulfate Nausea And Vomiting and Other (See Comments)    headache   Follow-up Information    Rubye Oaks, NP Follow up on 05/09/2016.   Specialty:  Pulmonary Disease Why:  Follow up with lung doctors at 9 am. Contact information: [address] [place] [address] [phone]            The results of significant diagnostics from this hospitalization (including imaging, microbiology, ancillary and laboratory) are listed below for reference.    Significant Diagnostic Studies: Dg Chest 2 View  Result Date: 04/17/2016  CLINICAL DATA:  Shortness of breath.  Pneumonia . EXAM: CHEST  2 VIEW COMPARISON:  04/16/2015 . FINDINGS: Mediastinum and hilar structures normal. COPD. Low lung volumes with mild bibasilar atelectasis. Interim near complete clearing of right upper lobe infiltrate. Small bilateral pleural effusions. Heart size normal. No acute bony abnormality . IMPRESSION: 1. Low lung volumes with mild bibasilar atelectasis and bilateral pleural effusions. Interim near complete clearing of right upper lobe infiltrate. 2. COPD. Electronically Signed   By: Maisie Fus  Register    On: 04/17/2016 10:11   Dg Chest 2 View  Result Date: 04/14/2016 CLINICAL DATA:  Pt states headache, body aches and chills since last night, husband states 2 episodes of same over last week, nonsmoker, no other chest complaints EXAM: CHEST  2 VIEW COMPARISON:  12/24/2015 FINDINGS: The lungs are hyperinflated likely secondary to COPD. There is no focal parenchymal opacity. There is no pleural effusion or pneumothorax. The heart and mediastinal contours are unremarkable. The osseous structures are unremarkable. IMPRESSION: No active cardiopulmonary disease. Electronically Signed   By: Elige Ko   On: 04/14/2016 12:07   Dg Chest Port 1 View  Result Date: 04/15/2016 CLINICAL DATA:  Chest pain and shortness of breath for 2 days EXAM: PORTABLE CHEST 1 VIEW COMPARISON:  04/14/2016 FINDINGS: Cardiac shadow is stable. Aortic calcifications are again seen. New right upper lobe infiltrate is noted which was not seen on the prior exam. No acute bony abnormality is noted. IMPRESSION: New right upper lobe infiltrate. Electronically Signed   By: Alcide Clever M.D.   On: 04/15/2016 11:06   US Thyroid  Result Date: 03/27/2016 CLINICAL DATA:  Palpable left nodule on physical exam EXAM: THYROID ULTRASOUND TECHNIQUE: Ultrasound examination of the thyroid gland and adjacent soft tissues was performed. COMPARISON:  None. FINDINGS: Parenchymal Echotexture: Mildly heterogenous Estimated total number of nodules >/= 1 cm: 1 Number of spongiform nodules >/=  2 cm not described below (TR1): 0 Number of mixed cystic and solid nodules >/= 1.5 cm not described below (TR2): 0 _________________________________________________________ Isthmus: 0.2 cm thickness No discrete nodules are identified within the thyroid isthmus. _________________________________________________________ Right lobe: 4.5 x 1.1 x 1.9 cm Several small cystic/ hypoechoic lesions all less than 3 mm. No discrete nodules are identified within the right lobe of the  thyroid. _________________________________________________________ Left lobe: 3.7 x 1 x 1.4 cm Nodule # 1: Location: Left; Inferior Size: 1.2 x 0.7 x 0.9 cm Composition: solid/almost completely solid (2) Echogenicity: hypoechoic (2)First Shape: not taller-than-wide (0) Margins: ill-defined (0) Echogenic foci: none (0) ACR TI-RADS total points: 4. ACR TI-RADS risk category: TR4 (4-6 points). ACR TI-RADS recommendations: *Given size (>/= 1 - 1.4 cm) and appearance, a follow-up ultrasound in 1 year should be considered based on TI-RADS criteria. IMPRESSION: 1. Normal-sized thyroid. 2. Single 1.2 cm inferior left nodule. Recommend 1 year follow-up surveillance ultrasound. The above is in keeping with the ACR TI-RADS recommendations - J Am Coll Radiol 2017;14:587-595. Electronically Signed   By: Corlis Leak M.D.   On: 03/27/2016 16:17    Microbiology: Recent Results (from the past 240 hour(s))  Culture, blood (Routine X 2) w Reflex to ID Panel     Status: None (Preliminary result)   Collection Time: 04/14/16 12:12 PM  Result Value Ref Range Status   Specimen Description BLOOD RIGHT ANTECUBITAL  Final   Special Requests BOTTLES DRAWN AEROBIC AND ANAEROBIC 5CC  Final   Culture   Final    NO GROWTH 3 DAYS Performed at [facility]    Report Status PENDING  Incomplete  Culture, blood (Routine X 2) w Reflex to ID Panel     Status: None (Preliminary result)   Collection Time: 04/14/16 12:12 PM  Result Value Ref Range Status   Specimen Description BLOOD RIGHT WRIST  Final   Special Requests BOTTLES DRAWN AEROBIC AND ANAEROBIC  Final   Culture   Final    NO GROWTH 3 DAYS Performed at [facility]    Report Status PENDING  Incomplete  Urine culture     Status: None   Collection Time: 04/14/16  1:45 PM  Result Value Ref Range Status   Specimen Description URINE, CLEAN CATCH  Final   Special Requests NONE  Final   Culture NO GROWTH Performed at [facility]   Final   Report  Status 04/15/2016 FINAL  Final  Respiratory Panel by PCR     Status: None   Collection Time: 04/14/16  4:00 PM  Result Value Ref Range Status   Adenovirus NOT DETECTED NOT DETECTED Final   Coronavirus 229E NOT DETECTED NOT DETECTED Final   Coronavirus HKU1 NOT DETECTED NOT DETECTED Final   Coronavirus NL63 NOT DETECTED NOT DETECTED Final   Coronavirus OC43 NOT DETECTED NOT DETECTED Final   Metapneumovirus NOT DETECTED NOT DETECTED Final   Rhinovirus / Enterovirus NOT DETECTED NOT DETECTED Final   Influenza A NOT DETECTED NOT DETECTED Final   Influenza B NOT DETECTED NOT DETECTED Final   Parainfluenza Virus 1 NOT DETECTED NOT DETECTED Final   Parainfluenza Virus 2 NOT DETECTED NOT DETECTED Final   Parainfluenza Virus 3 NOT DETECTED NOT DETECTED Final   Parainfluenza Virus 4 NOT DETECTED NOT DETECTED Final   Respiratory Syncytial Virus NOT DETECTED NOT DETECTED Final   Bordetella pertussis NOT DETECTED NOT DETECTED Final   Chlamydophila pneumoniae NOT DETECTED NOT DETECTED Final   Mycoplasma pneumoniae NOT DETECTED NOT DETECTED Final    Comment: Performed at [facility]  Culture, blood (routine x 2) Call MD if unable to obtain prior to antibiotics being given     Status: None (Preliminary result)   Collection Time: 04/15/16  1:36 PM  Result Value Ref Range Status   Specimen Description BLOOD LEFT ARM  Final   Special Requests BOTTLES DRAWN AEROBIC AND ANAEROBIC 6CC  Final   Culture   Final    NO GROWTH 2 DAYS Performed at [facility]    Report Status PENDING  Incomplete  Culture, blood (routine x 2) Call MD if unable to obtain prior to antibiotics being given     Status: None (Preliminary result)   Collection Time: 04/15/16  1:54 PM  Result Value Ref Range Status   Specimen Description BLOOD LEFT ARM  Final   Special Requests BOTTLES DRAWN AEROBIC AND ANAEROBIC 6CC  Final   Culture   Final    NO GROWTH 2 DAYS Performed at [facility]    Report Status  PENDING  Incomplete     Labs: Basic Metabolic Panel:  Recent Labs Lab 04/14/16 1212 04/15/16 0609 04/17/16 0526 04/18/16 0512  NA 136 138 134* 141  K 3.8 4.1 4.6 3.9  CL 104 108 103 105  CO2 22 21* 23 26  GLUCOSE 95 96 147* 101*  BUN 27* 16 18 26*  CREATININE 1.55* 1.35* 1.46* 1.35*  CALCIUM 8.9 8.0* 8.5* 9.0   Liver Function Tests:  Recent Labs Lab 04/15/16 0609  AST 18  ALT 23  ALKPHOS  40  BILITOT 0.9  PROT 5.8*  ALBUMIN 3.0*   No results for input(s): LIPASE, AMYLASE in the last 168 hours. No results for input(s): AMMONIA in the last 168 hours. CBC:  Recent Labs Lab 04/14/16 1212 04/15/16 0609 04/17/16 0526  WBC 13.2* 9.9 9.9  NEUTROABS 9.8*  --   --   HGB 12.8 12.0 11.8*  HCT 37.7 35.8* 35.6*  MCV 93.1 91.3 92.5  PLT 317 298 323   Cardiac Enzymes: No results for input(s): CKTOTAL, CKMB, CKMBINDEX, TROPONINI in the last 168 hours. BNP: BNP (last 3 results)  Recent Labs  04/15/16 0609  BNP 283.2*    ProBNP (last 3 results) No results for input(s): PROBNP in the last 8760 hours.  CBG: No results for input(s): GLUCAP in the last 168 hours.     SignedEdsel Petrin:  Keghan Mcfarren  [facility] 04/18/2016, 10:12 AM

## 2016-04-18 NOTE — Care Management Note (Signed)
Case Management Note  Patient Details  Name: Rosita FireDawn L Villescas MRN: [id number] Date of Birth: 1927/06/18  Subjective/Objective:                    Action/Plan:d/c SNF   Expected Discharge Date:  04/18/16               Expected Discharge Plan:  Skilled Nursing Facility  In-House Referral:  Clinical Social Work  Discharge planning Services  CM Consult  Post Acute Care Choice:    Choice offered to:     DME Arranged:    DME Agency:     HH Arranged:    HH Agency:     Status of Service:  Completed, signed off  If discussed at [employer] Meetings, dates discussed:    Additional Comments:  Lanier ClamMahabir, Ondria Oswald, RN 04/18/2016, 10:21 AM

## 2016-04-19 LAB — CULTURE, BLOOD (ROUTINE X 2)
Culture: NO GROWTH
Culture: NO GROWTH

## 2016-04-20 LAB — CULTURE, BLOOD (ROUTINE X 2)
CULTURE: NO GROWTH
Culture: NO GROWTH

## 2016-04-25 ENCOUNTER — Non-Acute Institutional Stay (SKILLED_NURSING_FACILITY): Payer: Medicare Other | Admitting: Internal Medicine

## 2016-04-25 ENCOUNTER — Encounter: Payer: Self-pay | Admitting: Internal Medicine

## 2016-04-25 DIAGNOSIS — I779 Disorder of arteries and arterioles, unspecified: Secondary | ICD-10-CM | POA: Insufficient documentation

## 2016-04-25 DIAGNOSIS — H409 Unspecified glaucoma: Secondary | ICD-10-CM | POA: Insufficient documentation

## 2016-04-25 DIAGNOSIS — R609 Edema, unspecified: Secondary | ICD-10-CM | POA: Insufficient documentation

## 2016-04-25 DIAGNOSIS — Z7901 Long term (current) use of anticoagulants: Secondary | ICD-10-CM | POA: Diagnosis not present

## 2016-04-25 DIAGNOSIS — M549 Dorsalgia, unspecified: Secondary | ICD-10-CM

## 2016-04-25 DIAGNOSIS — J181 Lobar pneumonia, unspecified organism: Secondary | ICD-10-CM

## 2016-04-25 DIAGNOSIS — R531 Weakness: Secondary | ICD-10-CM

## 2016-04-25 DIAGNOSIS — J189 Pneumonia, unspecified organism: Secondary | ICD-10-CM

## 2016-04-25 DIAGNOSIS — I480 Paroxysmal atrial fibrillation: Secondary | ICD-10-CM | POA: Diagnosis not present

## 2016-04-25 DIAGNOSIS — E46 Unspecified protein-calorie malnutrition: Secondary | ICD-10-CM | POA: Insufficient documentation

## 2016-04-25 DIAGNOSIS — E042 Nontoxic multinodular goiter: Secondary | ICD-10-CM | POA: Insufficient documentation

## 2016-04-25 DIAGNOSIS — M5440 Lumbago with sciatica, unspecified side: Secondary | ICD-10-CM

## 2016-04-25 DIAGNOSIS — R21 Rash and other nonspecific skin eruption: Secondary | ICD-10-CM | POA: Diagnosis not present

## 2016-04-25 DIAGNOSIS — I5031 Acute diastolic (congestive) heart failure: Secondary | ICD-10-CM

## 2016-04-25 DIAGNOSIS — G8929 Other chronic pain: Secondary | ICD-10-CM | POA: Insufficient documentation

## 2016-04-25 DIAGNOSIS — R0600 Dyspnea, unspecified: Secondary | ICD-10-CM | POA: Insufficient documentation

## 2016-04-25 DIAGNOSIS — R0602 Shortness of breath: Secondary | ICD-10-CM

## 2016-04-25 DIAGNOSIS — I509 Heart failure, unspecified: Secondary | ICD-10-CM | POA: Insufficient documentation

## 2016-04-25 DIAGNOSIS — N183 Chronic kidney disease, stage 3 unspecified: Secondary | ICD-10-CM | POA: Insufficient documentation

## 2016-04-25 DIAGNOSIS — E785 Hyperlipidemia, unspecified: Secondary | ICD-10-CM | POA: Insufficient documentation

## 2016-04-25 DIAGNOSIS — K921 Melena: Secondary | ICD-10-CM | POA: Insufficient documentation

## 2016-04-25 DIAGNOSIS — I4892 Unspecified atrial flutter: Secondary | ICD-10-CM | POA: Insufficient documentation

## 2016-04-25 DIAGNOSIS — M353 Polymyalgia rheumatica: Secondary | ICD-10-CM | POA: Insufficient documentation

## 2016-04-25 DIAGNOSIS — R269 Unspecified abnormalities of gait and mobility: Secondary | ICD-10-CM | POA: Diagnosis not present

## 2016-04-25 MED ORDER — FUROSEMIDE 40 MG PO TABS: ORAL_TABLET | ORAL | 3 refills | Status: DC

## 2016-04-25 MED ORDER — METOPROLOL SUCCINATE ER 50 MG PO TB24: ORAL_TABLET | ORAL | 3 refills | Status: DC

## 2016-04-25 MED ORDER — POTASSIUM CHLORIDE CRYS ER 20 MEQ PO TBCR: 20.0000 meq | EXTENDED_RELEASE_TABLET | Freq: Every day | ORAL | 3 refills | Status: DC

## 2016-04-25 NOTE — Progress Notes (Signed)
History and Physical    Provider:  Murray Hodgkins, MD Location:  [facility] Room Number: N33 Place of Service:  SNF (31)  PCP: Enrique Sack, MD Patient Care Team: Nila Nephew, MD as PCP - General (Internal Medicine)  Extended Emergency Contact Information Primary Emergency Contact: Wurtzel,Brooks Address: [address]          [place], [place] [address] [place] Home Phone: [phone] Mobile Phone: [phone] Relation: Spouse Secondary Emergency Contact: Norm Parcel [place] of [place] Mobile Phone: [phone] Relation: Daughter  Code Status: Full Code Goals of Care: Advanced Directive information Advanced Directives 04/25/2016  Does Patient Have a Medical Advance Directive? Yes  Type of Estate agent of [place];Living will  Does patient want to make changes to medical advance directive? -  Copy of Healthcare Power of Attorney in Chart? Yes  Pre-existing out of facility DNR order (yellow form or pink MOST form) -      Chief Complaint  Patient presents with  . New Admit To SNF    following hospitalization 04/14/16 to 04/18/16 for pneumonia    HPI: Patient is a 83 y.o. female seen today for admission to [facility] SN[facility] on 04/18/16 following hospitalization from 04/15/16 to 04/18/16. She was treated for acute hypoxic CAP. She also had mild acute diastolic CHF with mild elevation in BNP, edema, and pleural effusions. 2D Echo showed LVEF 55-60%.  She has known AF and had a rapid rate on admission. She has been treated with diltiazem chronically for rate control. Patient states she has been in and out of flutter in the past.. She was started on Eliquis during the hospitalization.  She has known CKD 3 with creatinine range in the hospital from 1.35 to 1.55.  She had thyroid scan in Dec 2017 that showed multinodular goiter.  She has chronic back pains related to prior disc disease. There is a remote history of  laminectomy.  Past Medical History:  Diagnosis Date  . Anticoagulated    Eliquis  . Atrial flutter (HCC)    NO CARDIOLOGIST FOLLOWED BY DR Tori Milks  . Bloody stools    LAST 4 DAYS  . CHF (congestive heart failure) (HCC)   . Chronic back pain   . CKD (chronic kidney disease), stage III    Creatinine 1.35 -1.55  . Diverticulosis   . Dysrhythmia   . Edema   . Gait disorder   . Glaucoma    Severe  . Glaucoma    both eyes  . High cholesterol   . Multinodular goiter   . PAOD (peripheral arterial occlusive disease) (HCC)    Stein in right groin, decrease pulses  . PMR (polymyalgia rheumatica) (HCC)   . Pneumonia, organism unspecified(486) 04/18/2016  . Protein-calorie malnutrition (HCC)   . Rash of back   . Weakness    Past Surgical History:  Procedure Laterality Date  . COLONOSCOPY N/A 07/15/2013   Procedure: COLONOSCOPY;  Surgeon: Louis Meckel, MD;  Location: [facility];  Service: Endoscopy;  Laterality: N/A;  . ESOPHAGOGASTRODUODENOSCOPY N/A 07/08/2013   Procedure: ESOPHAGOGASTRODUODENOSCOPY (EGD);  Surgeon: Louis Meckel, MD;  Location: [facility];  Service: Endoscopy;  Laterality: N/A;  Per Connye Burkitt, will try to have CRNA available, but will not guaranteee. Ok to schedule per Clydie Braun 07/06/13  . GALLBLADDER SURGERY    . IR GENERIC HISTORICAL  05/18/2014   IR RADIOLOGIST EVAL & MGMT 05/18/2014 Berdine Dance, MD [facility] INTERV RAD  .  LAMINECTOMY    . leg stent Right   . SHOULDER SURGERY     frozen shoulder/ Bil shoulders    reports that she quit smoking about 22 years ago. Her smoking use included Cigarettes. She has never used smokeless tobacco. She reports that she does not drink alcohol or use drugs. Social History   Social History  . Marital status: Married    Spouse name: N/A  . Number of children: 4  . Years of education: N/A   Occupational History  . Retired    Social History Main Topics  . Smoking status: Former Smoker    Types: Cigarettes     Quit date: 07/12/1993  . Smokeless tobacco: Never Used  . Alcohol use No  . Drug use: No  . Sexual activity: Not on file   Other Topics Concern  . Not on file   Social History Narrative   Married -Dr. Aura Fey   Former smoker - stopped 1995   Alcohol none   POA, Living Will    Functional Status Survey:    Family History  Problem Relation Age of Onset  . Throat cancer Mother   . Diabetes Maternal Uncle   . Throat cancer Maternal Grandfather   . Cirrhosis Maternal Aunt     Health Maintenance  Topic Date Due  . TETANUS/TDAP  05/07/1948  . ZOSTAVAX  05/07/1989  . PNA vac Low Risk Adult (1 of 2 - PCV13) 05/07/1994  . INFLUENZA VACCINE  11/06/2015  . DEXA SCAN  Completed    Allergies  Allergen Reactions  . Codeine Sulfate Nausea And Vomiting and Other (See Comments)    headache    Allergies as of 04/25/2016      Reactions   Codeine Sulfate Nausea And Vomiting, Other (See Comments)   headache      Medication List       Accurate as of 04/25/16  1:09 PM. Always use your most recent med list.          aspirin 81 MG tablet Take 81 mg by mouth daily.   atorvastatin 10 MG tablet Commonly known as:  LIPITOR Take 5 mg by mouth daily.   cholecalciferol 1000 units tablet Commonly known as:  VITAMIN D Take 3,000 Units by mouth daily.   diltiazem 120 MG 24 hr capsule Commonly known as:  CARDIZEM CD Take 120 mg by mouth 2 (two) times daily.   ELIQUIS 2.5 MG Tabs tablet Generic drug:  apixaban Take 2.5 mg by mouth 2 (two) times daily.   LUMIGAN 0.01 % Soln Generic drug:  bimatoprost Place 1 drop into both eyes at bedtime.   mometasone-formoterol 100-5 MCG/ACT Aero Commonly known as:  DULERA Inhale 2 puffs into the lungs. Inhale twice a day   naproxen sodium 220 MG tablet Commonly known as:  ANAPROX Take 220 mg by mouth daily as needed for pain.   OXYGEN Inhale into the lungs. 4 liter continuously   predniSONE 5 MG tablet Commonly known as:   DELTASONE Take 5 mg by mouth. Take one tablet daily   traMADol-acetaminophen 37.5-325 MG tablet Commonly known as:  ULTRACET Take 1-2 tablets by mouth 2 (two) times daily as needed for moderate pain.   TRAVATAN Z 0.004 % Soln ophthalmic solution Generic drug:  Travoprost (BAK Free) Place 1 drop into both eyes at bedtime.   vitamin E 1000 UNIT capsule Take 2,000 Units by mouth daily.       Review of Systems  Constitutional: Positive  for activity change, appetite change and fatigue. Negative for chills, diaphoresis, fever and unexpected weight change.  HENT: Negative for congestion, ear discharge, ear pain, hearing loss, postnasal drip, rhinorrhea, sore throat, tinnitus, trouble swallowing and voice change.   Eyes: Negative for pain, redness, itching and visual disturbance.       Glaucoma  Respiratory: Positive for shortness of breath. Negative for cough, choking and wheezing.   Cardiovascular: Positive for palpitations and leg swelling. Negative for chest pain.       Diastolic HF  Gastrointestinal: Negative for abdominal distention, abdominal pain, constipation, diarrhea and nausea.       Diverticulosis and hx esophagitis and GI bleed on Xarelto  Endocrine: Negative for cold intolerance, heat intolerance, polydipsia, polyphagia and polyuria.       Multinodular goiter  Genitourinary: Negative for difficulty urinating, dysuria, flank pain, frequency, hematuria, pelvic pain, urgency and vaginal discharge.  Musculoskeletal: Positive for arthralgias and back pain. Negative for gait problem, myalgias, neck pain and neck stiffness.  Skin: Negative for color change, pallor and rash.  Allergic/Immunologic: Negative.   Neurological: Positive for weakness. Negative for dizziness, tremors, seizures, syncope, numbness and headaches.  Hematological: Negative for adenopathy. Does not bruise/bleed easily.  Psychiatric/Behavioral: Negative for agitation, behavioral problems, confusion, dysphoric  mood, hallucinations, sleep disturbance and suicidal ideas. The patient is not nervous/anxious and is not hyperactive.     Vitals:   04/25/16 1237  BP: 138/90  Pulse: 74  Resp: 18  Temp: 98.5 F (36.9 C)  SpO2: 98%  Weight: 108 lb (49 kg)  Height: 5' 3.5" (1.613 m)   Body mass index is 18.83 kg/m. Physical Exam  Constitutional: She is oriented to person, place, and time. She appears well-developed and well-nourished. No distress.  HENT:  Right Ear: External ear normal.  Left Ear: External ear normal.  Nose: Nose normal.  Mouth/Throat: Oropharynx is clear and moist. No oropharyngeal exudate.  Eyes: Conjunctivae and EOM are normal. Pupils are equal, round, and reactive to light. No scleral icterus.  Corrective lenses  Neck: No JVD present. No tracheal deviation present. Thyromegaly present.  Cardiovascular: Normal rate, normal heart sounds and intact distal pulses.  Exam reveals no gallop and no friction rub.   No murmur heard. AF  Pulmonary/Chest: Effort normal. No respiratory distress. She has no wheezes. She has rales (lower lung bilaterally). She exhibits no tenderness.  Abdominal: She exhibits no distension and no mass. There is no tenderness.  Musculoskeletal: Normal range of motion. She exhibits edema (1-2+ bilateral). She exhibits no tenderness.  Mild low back pain to palpation  Lymphadenopathy:    She has no cervical adenopathy.  Neurological: She is alert and oriented to person, place, and time. No cranial nerve deficit. Coordination normal.  Skin: No rash noted. She is not diaphoretic. No erythema. No pallor.  Psychiatric: She has a normal mood and affect. Her behavior is normal. Judgment and thought content normal.    Labs reviewed: Basic Metabolic Panel:  Recent Labs  16/10/96 0609 04/17/16 0526 04/18/16 0512  NA 138 134* 141  K 4.1 4.6 3.9  CL 108 103 105  CO2 21* 23 26  GLUCOSE 96 147* 101*  BUN 16 18 26*  CREATININE 1.35* 1.46* 1.35*  CALCIUM 8.0*  8.5* 9.0   Liver Function Tests:  Recent Labs  04/15/16 0609  AST 18  ALT 23  ALKPHOS 40  BILITOT 0.9  PROT 5.8*  ALBUMIN 3.0*   No results for input(s): LIPASE, AMYLASE in the last  8760 hours. No results for input(s): AMMONIA in the last 8760 hours. CBC:  Recent Labs  04/14/16 1212 04/15/16 0609 04/17/16 0526  WBC 13.2* 9.9 9.9  NEUTROABS 9.8*  --   --   HGB 12.8 12.0 11.8*  HCT 37.7 35.8* 35.6*  MCV 93.1 91.3 92.5  PLT 317 298 323   Cardiac Enzymes: No results for input(s): CKTOTAL, CKMB, CKMBINDEX, TROPONINI in the last 8760 hours. BNP: Invalid input(s): POCBNP No results found for: HGBA1C No results found for: TSH No results found for: VITAMINB12 No results found for: FOLATE No results found for: IRON, TIBC, FERRITIN  Imaging and Procedures obtained prior to SNF admission: Dg Chest 2 View  Result Date: 04/14/2016 CLINICAL DATA:  Pt states headache, body aches and chills since last night, husband states 2 episodes of same over last week, nonsmoker, no other chest complaints EXAM: CHEST  2 VIEW COMPARISON:  12/24/2015 FINDINGS: The lungs are hyperinflated likely secondary to COPD. There is no focal parenchymal opacity. There is no pleural effusion or pneumothorax. The heart and mediastinal contours are unremarkable. The osseous structures are unremarkable. IMPRESSION: No active cardiopulmonary disease. Electronically Signed   By: Elige KoHetal  Patel   On: 04/14/2016 12:07   Dg Chest Port 1 View  Result Date: 04/15/2016 CLINICAL DATA:  Chest pain and shortness of breath for 2 days EXAM: PORTABLE CHEST 1 VIEW COMPARISON:  04/14/2016 FINDINGS: Cardiac shadow is stable. Aortic calcifications are again seen. New right upper lobe infiltrate is noted which was not seen on the prior exam. No acute bony abnormality is noted. IMPRESSION: New right upper lobe infiltrate. Electronically Signed   By: Alcide CleverMark  Lukens M.D.   On: 04/15/2016 11:06   03/27/16 THYROID  ULTRASOUND  TECHNIQUE: Ultrasound examination of the thyroid gland and adjacent soft tissues was performed.  COMPARISON:  None.  FINDINGS: Parenchymal Echotexture: Mildly heterogenous  Estimated total number of nodules >/= 1 cm: 1  Number of spongiform nodules >/=  2 cm not described below (TR1): 0  Number of mixed cystic and solid nodules >/= 1.5 cm not described below (TR2): 0  _________________________________________________________  Isthmus: 0.2 cm thickness  No discrete nodules are identified within the thyroid isthmus.  _________________________________________________________  Right lobe: 4.5 x 1.1 x 1.9 cm  Several small cystic/ hypoechoic lesions all less than 3 mm. No discrete nodules are identified within the right lobe of the thyroid.  _________________________________________________________  Left lobe: 3.7 x 1 x 1.4 cm  Nodule # 1:  Location: Left; Inferior  Size: 1.2 x 0.7 x 0.9 cm  Composition: solid/almost completely solid (2)  Echogenicity: hypoechoic (2)First  Shape: not taller-than-wide (0)  Margins: ill-defined (0)  Echogenic foci: none (0)  ACR TI-RADS total points: 4.  ACR TI-RADS risk category: TR4 (4-6 points).  ACR TI-RADS recommendations:  *Given size (>/= 1 - 1.4 cm) and appearance, a follow-up ultrasound in 1 year should be considered based on TI-RADS criteria.  IMPRESSION: 1. Normal-sized thyroid. 2. Single 1.2 cm inferior left nodule. Recommend 1 year follow-up surveillance ultrasound.   Assessment/Plan 1. Community acquired pneumonia of right upper lobe of lung (HCC) Continues on O2 supplementation. FU CXR  2. Gait disorder PT Aand OT  3. Weakness PT and OT  4. CKD (chronic kidney disease), stage III -CMP  5. Acute diastolic congestive heart failure (HCC) -furosemide 40 mg qd -KCl 20 mEq qd -DC diltiazem  6. PAOD (peripheral arterial occlusive disease) (HCC) observe  7.  Paroxysmal a-fib (HCC) -Eliquis -stopped diltiazem  and started metoprolol. Diltiazem may aggravate the edema.  8. Chronic anticoagulation Eliquis  9. Multinodular goiter -TSH  10. Chronic low back pain with sciatica, sciatica laterality unspecified, unspecified back pain laterality The current medical regimen is effective;  continue present plan and medications.  11. Protein-calorie malnutrition, unspecified severity (HCC) Should improve as she gets stronger  12. Rash of back Resolved.

## 2016-05-02 ENCOUNTER — Encounter: Payer: Self-pay | Admitting: Nurse Practitioner

## 2016-05-02 ENCOUNTER — Non-Acute Institutional Stay (SKILLED_NURSING_FACILITY): Payer: Medicare Other | Admitting: Nurse Practitioner

## 2016-05-02 DIAGNOSIS — I5031 Acute diastolic (congestive) heart failure: Secondary | ICD-10-CM | POA: Diagnosis not present

## 2016-05-02 DIAGNOSIS — J181 Lobar pneumonia, unspecified organism: Secondary | ICD-10-CM

## 2016-05-02 DIAGNOSIS — M353 Polymyalgia rheumatica: Secondary | ICD-10-CM

## 2016-05-02 DIAGNOSIS — J189 Pneumonia, unspecified organism: Secondary | ICD-10-CM

## 2016-05-02 DIAGNOSIS — B370 Candidal stomatitis: Secondary | ICD-10-CM | POA: Insufficient documentation

## 2016-05-02 DIAGNOSIS — I480 Paroxysmal atrial fibrillation: Secondary | ICD-10-CM

## 2016-05-02 DIAGNOSIS — N183 Chronic kidney disease, stage 3 unspecified: Secondary | ICD-10-CM

## 2016-05-02 NOTE — Assessment & Plan Note (Signed)
Taking Prednisone 

## 2016-05-02 NOTE — Assessment & Plan Note (Signed)
Last creat 1.35 04/18/16

## 2016-05-02 NOTE — Assessment & Plan Note (Addendum)
Fully  Treated, hacking cough occasionally. Prednisone, Dulera. Wean off O2 

## 2016-05-02 NOTE — Assessment & Plan Note (Addendum)
Compensated, continue Furosemide, 04/25/16 CXR probable small bilateral pleural effusions suggesting mild fluid overload.

## 2016-05-02 NOTE — Assessment & Plan Note (Signed)
Yellow, heavy coated tongue, c/o discomfort, Magic Mouth Wash 5ml s/s ac hs x 2 weeks.

## 2016-05-02 NOTE — Assessment & Plan Note (Signed)
Heart rate is in control, 05/01/16 decrease Metoprolol to 25mg  po qd due to lower bp

## 2016-05-02 NOTE — Progress Notes (Signed)
Location:  [facility] Room Number: 16 Place of Service:  SNF (31) Provider:  Corinthian Kemler, Manxie  NP  GREEN, Lorenda Ishihara, MD  Patient Care Team: Nila Nephew, MD as PCP - General (Internal Medicine) Kimber Relic, MD as Consulting Physician (Internal Medicine)  Extended Emergency Contact Information Primary Emergency Contact: Linzy,Brooks Address: [address]          [place], [place] [address] [place] of [place] Home Phone: [phone] Mobile Phone: [phone] Relation: Spouse Secondary Emergency Contact: Norm Parcel [place] of [place] Mobile Phone: [phone] Relation: Daughter  Code Status:  Full Code Goals of care: Advanced Directive information Advanced Directives 05/02/2016  Does Patient Have a Medical Advance Directive? Yes  Type of Estate agent of [place];Living will  Does patient want to make changes to medical advance directive? No - Patient declined  Copy of Healthcare Power of Attorney in Chart? Yes  Pre-existing out of facility DNR order (yellow form or pink MOST form) -     Chief Complaint  Patient presents with  . Acute Visit    Low BP    HPI:  Pt is a 83 y.o. female seen today for an acute visit for Afib, noted low BP measurements, decreased Metoprolol to 25mg  qd 05/01/16.   Hx of Afib, taking Metoprolol 25mg , Ellquis, heart rate is in control. CHF, compensated, taking Furosemide 40mg , Kcl , CKD creat 1.35-1.55. PMR chronic Prednisone.   Last hospitalization 04/14/16-04/18/16 PNA respiratory failure, f/u Pulmonology 05/09/16  Past Medical History:  Diagnosis Date  . Anticoagulated    Eliquis  . Atrial flutter (HCC)    NO CARDIOLOGIST FOLLOWED BY DR Tori Milks  . Bloody stools    LAST 4 DAYS  . CHF (congestive heart failure) (HCC)   . Chronic back pain   . CKD (chronic kidney disease), stage III    Creatinine 1.35 -1.55  . Diverticulosis   . Dysrhythmia   . Edema   . Gait disorder   .  Glaucoma    Severe  . Glaucoma    both eyes  . High cholesterol   . Multinodular goiter   . PAOD (peripheral arterial occlusive disease) (HCC)    Stein in right groin, decrease pulses  . PMR (polymyalgia rheumatica) (HCC)   . Pneumonia, organism unspecified(486) 04/18/2016  . Protein-calorie malnutrition (HCC)   . Rash of back   . Weakness    Past Surgical History:  Procedure Laterality Date  . COLONOSCOPY N/A 07/15/2013   Procedure: COLONOSCOPY;  Surgeon: Louis Meckel, MD;  Location: [facility]OSCOPY;  Service: Endoscopy;  Laterality: N/A;  . ESOPHAGOGASTRODUODENOSCOPY N/A 07/08/2013   Procedure: ESOPHAGOGASTRODUODENOSCOPY (EGD);  Surgeon: Louis Meckel, MD;  Location: [facility];  Service: Endoscopy;  Laterality: N/A;  Per Connye Burkitt, will try to have CRNA available, but will not guaranteee. Ok to schedule per Clydie Braun 07/06/13  . GALLBLADDER SURGERY    . IR GENERIC HISTORICAL  05/18/2014   IR RADIOLOGIST EVAL & MGMT 05/18/2014 Berdine Dance, MD [facility] INTERV RAD  . LAMINECTOMY    . leg stent Right   . SHOULDER SURGERY     frozen shoulder/ Bil shoulders    Allergies  Allergen Reactions  . Codeine Sulfate Nausea And Vomiting and Other (See Comments)    headache    Allergies as of 05/02/2016      Reactions   Codeine Sulfate Nausea And Vomiting, Other (See Comments)   headache      Medication List  Accurate as of 05/02/16  1:55 PM. Always use your most recent med list.          aspirin 81 MG tablet Take 81 mg by mouth daily.   atorvastatin 10 MG tablet Commonly known as:  LIPITOR Take 5 mg by mouth daily.   cholecalciferol 1000 units tablet Commonly known as:  VITAMIN D Take 3,000 Units by mouth daily.   ELIQUIS 2.5 MG Tabs tablet Generic drug:  apixaban Take 2.5 mg by mouth 2 (two) times daily.   furosemide 40 MG tablet Commonly known as:  LASIX One daily to help control edema   LUMIGAN 0.01 % Soln Generic drug:  bimatoprost Place 1 drop into both  eyes at bedtime.   metoprolol succinate 50 MG 24 hr tablet Commonly known as:  TOPROL-XL One daily to regulate heart rhythm and for CHF   mometasone-formoterol 100-5 MCG/ACT Aero Commonly known as:  DULERA Inhale 2 puffs into the lungs. Inhale twice a day   naproxen sodium 220 MG tablet Commonly known as:  ANAPROX Take 220 mg by mouth daily as needed for pain.   OXYGEN Inhale into the lungs. 4 liter continuously   potassium chloride SA 20 MEQ tablet Commonly known as:  K-DUR,KLOR-CON Take 1 tablet (20 mEq total) by mouth daily.   predniSONE 5 MG tablet Commonly known as:  DELTASONE Take 5 mg by mouth. Take one tablet daily   traMADol-acetaminophen 37.5-325 MG tablet Commonly known as:  ULTRACET Take 1-2 tablets by mouth 2 (two) times daily as needed for moderate pain.   TRAVATAN Z 0.004 % Soln ophthalmic solution Generic drug:  Travoprost (BAK Free) Place 1 drop into both eyes at bedtime.   vitamin E 1000 UNIT capsule Take 2,000 Units by mouth daily.       Review of Systems  Constitutional: Negative for activity change, appetite change, chills, diaphoresis, fatigue, fever and unexpected weight change.  HENT: Negative for congestion, ear discharge, ear pain, hearing loss, postnasal drip, rhinorrhea, sore throat, tinnitus, trouble swallowing and voice change.   Eyes: Negative for pain, redness, itching and visual disturbance.       Glaucoma  Respiratory: Positive for shortness of breath. Negative for cough, choking and wheezing.   Cardiovascular: Positive for palpitations and leg swelling. Negative for chest pain.       Diastolic HF  Gastrointestinal: Negative for abdominal distention, abdominal pain, constipation, diarrhea and nausea.       Diverticulosis and hx esophagitis and GI bleed on Xarelto  Endocrine: Negative for cold intolerance, heat intolerance, polydipsia, polyphagia and polyuria.       Multinodular goiter  Genitourinary: Negative for difficulty  urinating, dysuria, flank pain, frequency, hematuria, pelvic pain, urgency and vaginal discharge.  Musculoskeletal: Positive for arthralgias and back pain. Negative for gait problem, myalgias, neck pain and neck stiffness.  Skin: Negative for color change, pallor and rash.  Allergic/Immunologic: Negative.   Neurological: Positive for weakness. Negative for dizziness, tremors, seizures, syncope, numbness and headaches.  Hematological: Negative for adenopathy. Does not bruise/bleed easily.  Psychiatric/Behavioral: Negative for agitation, behavioral problems, confusion, dysphoric mood, hallucinations, sleep disturbance and suicidal ideas. The patient is not nervous/anxious and is not hyperactive.     Immunization History  Administered Date(s) Administered  . Influenza-Unspecified 12/06/2013   Pertinent  Health Maintenance Due  Topic Date Due  . PNA vac Low Risk Adult (1 of 2 - PCV13) 05/07/1994  . INFLUENZA VACCINE  11/06/2015  . DEXA SCAN  Completed   No flowsheet  data found. Functional Status Survey:    Vitals:   05/02/16 1254  BP: 108/60  Pulse: 68  Resp: 14  Temp: 98.4 F (36.9 C)  SpO2: 98%  Weight: 108 lb (49 kg)  Height: 5' 3.5" (1.613 m)   Body mass index is 18.83 kg/m. Physical Exam  Constitutional: She is oriented to person, place, and time. She appears well-developed and well-nourished. No distress.  HENT:  Right Ear: External ear normal.  Left Ear: External ear normal.  Nose: Nose normal.  Mouth/Throat: Oropharynx is clear and moist. No oropharyngeal exudate.  Eyes: Conjunctivae and EOM are normal. Pupils are equal, round, and reactive to light. No scleral icterus.  Corrective lenses  Neck: No JVD present. No tracheal deviation present. Thyromegaly present.  Cardiovascular: Normal rate, normal heart sounds and intact distal pulses.  Exam reveals no gallop and no friction rub.   No murmur heard. AF  Pulmonary/Chest: Effort normal. No respiratory distress. She  has no wheezes. She has rales (lower lung bilaterally). She exhibits no tenderness.  Abdominal: She exhibits no distension and no mass. There is no tenderness.  Musculoskeletal: Normal range of motion. She exhibits edema (1-2+ bilateral). She exhibits no tenderness.  Mild low back pain to palpation  Lymphadenopathy:    She has no cervical adenopathy.  Neurological: She is alert and oriented to person, place, and time. No cranial nerve deficit. Coordination normal.  Skin: No rash noted. She is not diaphoretic. No erythema. No pallor.  Psychiatric: She has a normal mood and affect. Her behavior is normal. Judgment and thought content normal.    Labs reviewed:  Recent Labs  04/15/16 0609 04/17/16 0526 04/18/16 0512  NA 138 134* 141  K 4.1 4.6 3.9  CL 108 103 105  CO2 21* 23 26  GLUCOSE 96 147* 101*  BUN 16 18 26*  CREATININE 1.35* 1.46* 1.35*  CALCIUM 8.0* 8.5* 9.0    Recent Labs  04/15/16 0609  AST 18  ALT 23  ALKPHOS 40  BILITOT 0.9  PROT 5.8*  ALBUMIN 3.0*    Recent Labs  04/14/16 1212 04/15/16 0609 04/17/16 0526  WBC 13.2* 9.9 9.9  NEUTROABS 9.8*  --   --   HGB 12.8 12.0 11.8*  HCT 37.7 35.8* 35.6*  MCV 93.1 91.3 92.5  PLT 317 298 323   No results found for: TSH No results found for: HGBA1C No results found for: CHOL, HDL, LDLCALC, LDLDIRECT, TRIG, CHOLHDL  Significant Diagnostic Results in last 30 days:  Dg Chest 2 View  Result Date: 04/17/2016 CLINICAL DATA:  Shortness of breath.  Pneumonia . EXAM: CHEST  2 VIEW COMPARISON:  04/16/2015 . FINDINGS: Mediastinum and hilar structures normal. COPD. Low lung volumes with mild bibasilar atelectasis. Interim near complete clearing of right upper lobe infiltrate. Small bilateral pleural effusions. Heart size normal. No acute bony abnormality . IMPRESSION: 1. Low lung volumes with mild bibasilar atelectasis and bilateral pleural effusions. Interim near complete clearing of right upper lobe infiltrate. 2. COPD.  Electronically Signed   By: Maisie Fus  Register   On: 04/17/2016 10:11   Dg Chest 2 View  Result Date: 04/14/2016 CLINICAL DATA:  Pt states headache, body aches and chills since last night, husband states 2 episodes of same over last week, nonsmoker, no other chest complaints EXAM: CHEST  2 VIEW COMPARISON:  12/24/2015 FINDINGS: The lungs are hyperinflated likely secondary to COPD. There is no focal parenchymal opacity. There is no pleural effusion or pneumothorax. The heart and mediastinal  contours are unremarkable. The osseous structures are unremarkable. IMPRESSION: No active cardiopulmonary disease. Electronically Signed   By: Elige Ko   On: 04/14/2016 12:07   Dg Chest Port 1 View  Result Date: 04/15/2016 CLINICAL DATA:  Chest pain and shortness of breath for 2 days EXAM: PORTABLE CHEST 1 VIEW COMPARISON:  04/14/2016 FINDINGS: Cardiac shadow is stable. Aortic calcifications are again seen. New right upper lobe infiltrate is noted which was not seen on the prior exam. No acute bony abnormality is noted. IMPRESSION: New right upper lobe infiltrate. Electronically Signed   By: Alcide Clever M.D.   On: 04/15/2016 11:06    Assessment/Plan Paroxysmal a-fib (HCC) Heart rate is in control, 05/01/16 decrease Metoprolol to 25mg  po qd due to lower bp  CHF (congestive heart failure) (HCC) Compensated, continue Furosemide, 04/25/16 CXR probable small bilateral pleural effusions suggesting mild fluid overload.    CAP (community acquired pneumonia) Fully  Treated, hacking cough occasionally. Prednisone, Dulera. Wean off O2  CKD (chronic kidney disease), stage III Last creat 1.35 04/18/16  PMR (polymyalgia rheumatica) (HCC) Taking Prednisone  Thrush, oral Yellow, heavy coated tongue, c/o discomfort, Magic Mouth Wash 5ml s/s ac hs x 2 weeks.      Family/ staff Communication: IL when abl.e   Labs/tests ordered:  none

## 2016-05-09 ENCOUNTER — Encounter: Payer: Self-pay | Admitting: Adult Health

## 2016-05-09 ENCOUNTER — Ambulatory Visit (INDEPENDENT_AMBULATORY_CARE_PROVIDER_SITE_OTHER): Payer: Medicare Other | Admitting: Adult Health

## 2016-05-09 ENCOUNTER — Inpatient Hospital Stay: Payer: Medicare Other | Admitting: Adult Health

## 2016-05-09 ENCOUNTER — Ambulatory Visit (INDEPENDENT_AMBULATORY_CARE_PROVIDER_SITE_OTHER)
Admission: RE | Admit: 2016-05-09 | Discharge: 2016-05-09 | Disposition: A | Discharge status: Home / Self Care | Payer: Medicare Other | Source: Ambulatory Visit | Attending: Adult Health | Admitting: Adult Health

## 2016-05-09 VITALS — BP 104/62 | HR 80 | Ht 63.0 in | Wt 113.2 lb

## 2016-05-09 DIAGNOSIS — I779 Disorder of arteries and arterioles, unspecified: Secondary | ICD-10-CM | POA: Diagnosis not present

## 2016-05-09 DIAGNOSIS — J449 Chronic obstructive pulmonary disease, unspecified: Secondary | ICD-10-CM

## 2016-05-09 DIAGNOSIS — J181 Lobar pneumonia, unspecified organism: Secondary | ICD-10-CM

## 2016-05-09 DIAGNOSIS — J189 Pneumonia, unspecified organism: Secondary | ICD-10-CM

## 2016-05-09 DIAGNOSIS — J9611 Chronic respiratory failure with hypoxia: Secondary | ICD-10-CM

## 2016-05-09 NOTE — Assessment & Plan Note (Signed)
Moderate COPD w/ progressive sx w/ recent CAP .  Continue on Advair .

## 2016-05-09 NOTE — Patient Instructions (Signed)
Continue on Advair 1 puff Twice daily  , rinse after use.  Chest xray today .  Follow up Dr. Craige CottaSood  In 2 months and As needed   Please contact office for sooner follow up if symptoms do not improve or worsen or seek emergency care

## 2016-05-09 NOTE — Assessment & Plan Note (Signed)
Ambulatory O2 sats per report have been >90% on room air  Continue to monitor .

## 2016-05-09 NOTE — Assessment & Plan Note (Signed)
Clinically improving after abx.  Check cxr today for clearance.

## 2016-05-09 NOTE — Progress Notes (Signed)
I have reviewed and agree with assessment/plan.  Coralyn HellingVineet Ryllie Nieland, MD [facility] Pulmonary/Critical Care 05/09/2016, 3:29 PM Pager:  [phone]

## 2016-05-09 NOTE — Progress Notes (Signed)
@Patient  ID: [name], female    DOB: 1927-08-13, 83 y.o.   MRN: [id number]  Chief Complaint  Patient presents with  . Follow-up    post hospital follow up     Referring provider: Nila Nephew, MD  HPI: 83 yo female former smoker seen for pulmonary consult during hospitalization on 04/16/16 for CAP   TEST  Echo 1/09 /18>> EF 55 to 60%, PAS 33 mmHg Spirometry 1/11 >> FVC 1.93 (86%), FEV1% 1.15 (70%), FEV1% 60  05/09/2016 Post Hospital follow up : CAP  Patient presents for a one-month post hospital follow-up. Patient was recently seen during her hospitalization last month for a pulmonary consult for pneumonia. Patient was admitted with acute hypoxic respiratory failure, community-acquired pneumonia, chest x-ray showed a right upper lobe infiltrate consistent with pneumonia. Patient was started on IV antibiotics. Her BNP was elevated slightly. A echo showed an EF of 55-60%. Pulmonary artery pressure 33 mmHg Viral panel was negative. She did have a strong smoking history. Spirometry did show mild obstructive disease. She was treated with nebulized bronchodilators. She was discharged on Advair.Marland Kitchen Her hospital stay. Was competent about atrial fibrillation with RVR. This was managed by cardiology. He was discharged to rehabilitation center. She was discharged on oxygen however, says over the last week. O2 was stopped at the nursing home and she has not needed any longer with O2 saturations remaining greater than 90% on room air.  Since discharge. Patient is feeling better, she remains weak. She undergoing PT at rehab center.  Walking with walker now but still gets winded   Prior to admission she had noticed that her breathing was not as good. Got winded easily .  Was not on any inahlers prior to admission.   Hospital records from [facility] reviwed .     Allergies  Allergen Reactions  . Codeine Sulfate Nausea And Vomiting and Other (See Comments)    headache    Immunization History    Administered Date(s) Administered  . Influenza, High Dose Seasonal PF 01/05/2016  . Influenza-Unspecified 12/06/2013    Past Medical History:  Diagnosis Date  . Anticoagulated    Eliquis  . Atrial flutter (HCC)    NO CARDIOLOGIST FOLLOWED BY DR Tori Milks  . Bloody stools    LAST 4 DAYS  . CHF (congestive heart failure) (HCC)   . Chronic back pain   . CKD (chronic kidney disease), stage III    Creatinine 1.35 -1.55  . Diverticulosis   . Dysrhythmia   . Edema   . Gait disorder   . Glaucoma    Severe  . Glaucoma    both eyes  . High cholesterol   . Multinodular goiter   . PAOD (peripheral arterial occlusive disease) (HCC)    Stein in right groin, decrease pulses  . PMR (polymyalgia rheumatica) (HCC)   . Pneumonia, organism unspecified(486) 04/18/2016  . Protein-calorie malnutrition (HCC)   . Rash of back   . Weakness     Tobacco History: History  Smoking Status  . Former Smoker  . Types: Cigarettes  . Quit date: 07/12/1993  Smokeless Tobacco  . Never Used   Counseling given: Not Answered   Outpatient Encounter Prescriptions as of 05/09/2016  Medication Sig  . aspirin 81 MG tablet Take 81 mg by mouth daily.  Marland Kitchen atorvastatin (LIPITOR) 10 MG tablet Take 5 mg by mouth daily.   . cholecalciferol (VITAMIN D) 1000 UNITS tablet Take 3,000 Units by mouth daily.  Marland Kitchen ELIQUIS 2.5  MG TABS tablet Take 2.5 mg by mouth 2 (two) times daily.  . furosemide (LASIX) 40 MG tablet One daily to help control edema  . LUMIGAN 0.01 % SOLN Place 1 drop into both eyes at bedtime.   . metoprolol succinate (TOPROL-XL) 50 MG 24 hr tablet One daily to regulate heart rhythm and for CHF  . mometasone-formoterol (DULERA) 100-5 MCG/ACT AERO Inhale 2 puffs into the lungs. Inhale twice a day  . naproxen sodium (ANAPROX) 220 MG tablet Take 220 mg by mouth daily as needed for pain.  . OXYGEN Inhale into the lungs. 4 liter continuously  . potassium chloride SA (K-DUR,KLOR-CON) 20 MEQ tablet Take 1  tablet (20 mEq total) by mouth daily.  . predniSONE (DELTASONE) 5 MG tablet Take 5 mg by mouth. Take one tablet daily  . TRAVATAN Z 0.004 % SOLN ophthalmic solution Place 1 drop into both eyes at bedtime.   . vitamin E 1000 UNIT capsule Take 2,000 Units by mouth daily.  . traMADol-acetaminophen (ULTRACET) 37.5-325 MG per tablet Take 1-2 tablets by mouth 2 (two) times daily as needed for moderate pain.    No facility-administered encounter medications on file as of 05/09/2016.      Review of Systems  Constitutional:   No  weight loss, night sweats,  Fevers, chills, + fatigue, or  lassitude.  HEENT:   No headaches,  Difficulty swallowing,  Tooth/dental problems, or  Sore throat,                No sneezing, itching, ear ache, nasal congestion, post nasal drip,   CV:  No chest pain,  Orthopnea, PND, , anasarca, dizziness, palpitations, syncope.    GI  No heartburn, indigestion, abdominal pain, nausea, vomiting, diarrhea, change in bowel habits, loss of appetite, bloody stools.   Resp:    No wheezing.  No chest wall deformity  Skin: no rash or lesions.  GU: no dysuria, change in color of urine, no urgency or frequency.  No flank pain, no hematuria   MS:  No joint pain or swelling.  No decreased range of motion.  No back pain.    Physical Exam  BP 104/62 (BP Location: Left Arm, Cuff Size: Normal)   Pulse 80   SpO2 95%   GEN: A/Ox3; pleasant , NAD,frail in wc    HEENT:  [place]/AT,   l, NOSE-clear, THROAT-clear, no lesions, no postnasal drip or exudate noted.   NECK:  Supple w/ fair ROM; no JVD; normal carotid impulses w/o bruits; no thyromegaly or nodules palpated; no lymphadenopathy.    RESP  Clear  P & A; w/o, wheezes/ rales/ or rhonchi. no accessory muscle use, no dullness to percussion  CARD:  RRR, no m/r/g, no peripheral edema, pulses intact, no cyanosis or clubbing.  GI:   Soft & nt; nml bowel sounds; no organomegaly or masses detected.   Musco: Warm bil, no deformities or  joint swelling noted.   Neuro: alert, no focal deficits noted.    Skin: Warm, no lesions or rashes  Psych:  No change in mood or affect. No depression or anxiety.  No memory loss.  Lab Results:  CBC    Component Value Date/Time   WBC 9.9 04/17/2016 0526   RBC 3.85 (L) 04/17/2016 0526   HGB 11.8 (L) 04/17/2016 0526   HCT 35.6 (L) 04/17/2016 0526   PLT 323 04/17/2016 0526   MCV 92.5 04/17/2016 0526   MCH 30.6 04/17/2016 0526   MCHC 33.1 04/17/2016 0526  RDW 14.7 04/17/2016 0526   LYMPHSABS 1.0 04/14/2016 1212   MONOABS 2.0 (H) 04/14/2016 1212   EOSABS 0.4 04/14/2016 1212   BASOSABS 0.0 04/14/2016 1212    BMET    Component Value Date/Time   NA 141 04/18/2016 0512   K 3.9 04/18/2016 0512   CL 105 04/18/2016 0512   CO2 26 04/18/2016 0512   GLUCOSE 101 (H) 04/18/2016 0512   BUN 26 (H) 04/18/2016 0512   CREATININE 1.35 (H) 04/18/2016 0512   CALCIUM 9.0 04/18/2016 0512   GFRNONAA 34 (L) 04/18/2016 0512   GFRAA 40 (L) 04/18/2016 0512    BNP    Component Value Date/Time   BNP 283.2 (H) 04/15/2016 0609    ProBNP No results found for: PROBNP  Imaging: Dg Chest 2 View  Result Date: 04/17/2016 CLINICAL DATA:  Shortness of breath.  Pneumonia . EXAM: CHEST  2 VIEW COMPARISON:  04/16/2015 . FINDINGS: Mediastinum and hilar structures normal. COPD. Low lung volumes with mild bibasilar atelectasis. Interim near complete clearing of right upper lobe infiltrate. Small bilateral pleural effusions. Heart size normal. No acute bony abnormality . IMPRESSION: 1. Low lung volumes with mild bibasilar atelectasis and bilateral pleural effusions. Interim near complete clearing of right upper lobe infiltrate. 2. COPD. Electronically Signed   By: [name]  Register   On: 04/17/2016 10:11   Dg Chest 2 View  Result Date: 04/14/2016 CLINICAL DATA:  Pt states headache, body aches and chills since last night, husband states 2 episodes of same over last week, nonsmoker, no other chest complaints  EXAM: CHEST  2 VIEW COMPARISON:  12/24/2015 FINDINGS: The lungs are hyperinflated likely secondary to COPD. There is no focal parenchymal opacity. There is no pleural effusion or pneumothorax. The heart and mediastinal contours are unremarkable. The osseous structures are unremarkable. IMPRESSION: No active cardiopulmonary disease. Electronically Signed   By: Elige KoHetal  Patel   On: 04/14/2016 12:07   Dg Chest Port 1 View  Result Date: 04/15/2016 CLINICAL DATA:  Chest pain and shortness of breath for 2 days EXAM: PORTABLE CHEST 1 VIEW COMPARISON:  04/14/2016 FINDINGS: Cardiac shadow is stable. Aortic calcifications are again seen. New right upper lobe infiltrate is noted which was not seen on the prior exam. No acute bony abnormality is noted. IMPRESSION: New right upper lobe infiltrate. Electronically Signed   By: Alcide CleverMark  Lukens M.D.   On: 04/15/2016 11:06     Assessment & Plan:   No problem-specific Assessment & Plan notes found for this encounter.     Rubye Oaksammy Parrett, NP 05/09/2016

## 2016-05-15 LAB — HEPATIC FUNCTION PANEL
ALT: 17 U/L (ref 7–35)
AST: 18 U/L (ref 13–35)
Alkaline Phosphatase: 44 U/L (ref 25–125)
Bilirubin, Total: 0.5 mg/dL

## 2016-05-15 LAB — BASIC METABOLIC PANEL
BUN: 42 mg/dL — AB (ref 4–21)
CREATININE: 1.9 mg/dL — AB (ref <=1.1)
GLUCOSE: 78 mg/dL
Potassium: 3.5 mmol/L (ref 3.4–5.3)
Sodium: 143 mmol/L (ref 137–147)

## 2016-05-15 LAB — TSH: TSH: 3.58 u[IU]/mL (ref <=5.90)

## 2016-05-16 ENCOUNTER — Non-Acute Institutional Stay (SKILLED_NURSING_FACILITY): Payer: Medicare Other | Admitting: Nurse Practitioner

## 2016-05-16 ENCOUNTER — Encounter: Payer: Self-pay | Admitting: Nurse Practitioner

## 2016-05-16 DIAGNOSIS — I480 Paroxysmal atrial fibrillation: Secondary | ICD-10-CM | POA: Diagnosis not present

## 2016-05-16 DIAGNOSIS — J9611 Chronic respiratory failure with hypoxia: Secondary | ICD-10-CM | POA: Diagnosis not present

## 2016-05-16 DIAGNOSIS — I5031 Acute diastolic (congestive) heart failure: Secondary | ICD-10-CM

## 2016-05-16 DIAGNOSIS — E46 Unspecified protein-calorie malnutrition: Secondary | ICD-10-CM

## 2016-05-16 DIAGNOSIS — J449 Chronic obstructive pulmonary disease, unspecified: Secondary | ICD-10-CM

## 2016-05-16 DIAGNOSIS — E042 Nontoxic multinodular goiter: Secondary | ICD-10-CM

## 2016-05-16 DIAGNOSIS — M353 Polymyalgia rheumatica: Secondary | ICD-10-CM

## 2016-05-16 DIAGNOSIS — N183 Chronic kidney disease, stage 3 unspecified: Secondary | ICD-10-CM

## 2016-05-16 DIAGNOSIS — B370 Candidal stomatitis: Secondary | ICD-10-CM

## 2016-05-16 DIAGNOSIS — R609 Edema, unspecified: Secondary | ICD-10-CM

## 2016-05-16 NOTE — Assessment & Plan Note (Signed)
Heart rate is in control, 05/01/16 decrease Metoprolol to 25mg  po qd

## 2016-05-16 NOTE — Assessment & Plan Note (Signed)
Trace edema BLE, continue Furosemide.  

## 2016-05-16 NOTE — Progress Notes (Signed)
Location:  [facility] Room Number: 9 Place of Service:  SNF (31)  Provider:Silviano Neuser, Manxie  NP  PCP: Enrique Sack, MD Patient Care Team: Nila Nephew, MD as PCP - General (Internal Medicine) Kimber Relic, MD as Consulting Physician (Internal Medicine)  Extended Emergency Contact Information Primary Emergency Contact: Buzby,Brooks Address: [address]          [place], [place] [address] [place] of [place] Home Phone: [phone] Mobile Phone: [phone] Relation: Spouse Secondary Emergency Contact: Norm Parcel [place] of [place] Mobile Phone: [phone] Relation: Daughter  Code Status: Full Code Goals of care:  Advanced Directive information Advanced Directives 05/16/2016  Does Patient Have a Medical Advance Directive? Yes  Type of Estate agent of [place];Living will  Does patient want to make changes to medical advance directive? No - Patient declined  Copy of Healthcare Power of Attorney in Chart? Yes  Pre-existing out of facility DNR order (yellow form or pink MOST form) -     Allergies  Allergen Reactions  . Codeine Sulfate Nausea And Vomiting and Other (See Comments)    headache    Chief Complaint  Patient presents with  . Discharge Note    HPI:  83 y.o. female admitted to SNF [facility] following hospitalization 04/14/16-04/18/16 PNA respiratory failure, f/u Pulmonology 05/09/16. She has received PT, OT, ST eval and treatment here, her condition is stable and ready for discharging home [place]    Hx of Afib, taking Metoprolol 25mg , Ellquis, heart rate is in control. CHF, compensated, BNP 293.5 05/15/16,  taking Furosemide 40mg , Kcl , CKD creat 1.35-1.85. PMR chronic Prednisone.    Past Medical History:  Diagnosis Date  . Anticoagulated    Eliquis  . Atrial flutter (HCC)    NO CARDIOLOGIST FOLLOWED BY DR Tori Milks  . Bloody stools    LAST 4 DAYS  . CHF (congestive heart failure) (HCC)   . Chronic  back pain   . CKD (chronic kidney disease), stage III    Creatinine 1.35 -1.55  . Diverticulosis   . Dysrhythmia   . Edema   . Gait disorder   . Glaucoma    Severe  . Glaucoma    both eyes  . High cholesterol   . Multinodular goiter   . PAOD (peripheral arterial occlusive disease) (HCC)    Stein in right groin, decrease pulses  . PMR (polymyalgia rheumatica) (HCC)   . Pneumonia, organism unspecified(486) 04/18/2016  . Protein-calorie malnutrition (HCC)   . Rash of back   . Weakness     Past Surgical History:  Procedure Laterality Date  . COLONOSCOPY N/A 07/15/2013   Procedure: COLONOSCOPY;  Surgeon: Louis Meckel, MD;  Location: [facility]COPY;  Service: Endoscopy;  Laterality: N/A;  . ESOPHAGOGASTRODUODENOSCOPY N/A 07/08/2013   Procedure: ESOPHAGOGASTRODUODENOSCOPY (EGD);  Surgeon: Louis Meckel, MD;  Location: [facility];  Service: Endoscopy;  Laterality: N/A;  Per Connye Burkitt, will try to have CRNA available, but will not guaranteee. Ok to schedule per Clydie Braun 07/06/13  . GALLBLADDER SURGERY    . IR GENERIC HISTORICAL  05/18/2014   IR RADIOLOGIST EVAL & MGMT 05/18/2014 Berdine Dance, MD [facility] INTERV RAD  . LAMINECTOMY    . leg stent Right   . SHOULDER SURGERY     frozen shoulder/ Bil shoulders      reports that she quit smoking about 22 years ago. Her smoking use included Cigarettes. She has never used smokeless tobacco. She reports that she does  not drink alcohol or use drugs. Social History   Social History  . Marital status: Married    Spouse name: N/A  . Number of children: 4  . Years of education: N/A   Occupational History  . Retired    Social History Main Topics  . Smoking status: Former Smoker    Types: Cigarettes    Quit date: 07/12/1993  . Smokeless tobacco: Never Used  . Alcohol use No  . Drug use: No  . Sexual activity: Not on file   Other Topics Concern  . Not on file   Social History Narrative   Married -Dr. Aura Fey   Former smoker  - stopped 1995   Alcohol none   POA, Living Will   Functional Status Survey:    Allergies  Allergen Reactions  . Codeine Sulfate Nausea And Vomiting and Other (See Comments)    headache    Pertinent  Health Maintenance Due  Topic Date Due  . PNA vac Low Risk Adult (1 of 2 - PCV13) 04/08/1995  . INFLUENZA VACCINE  Completed  . DEXA SCAN  Completed    Medications: Allergies as of 05/16/2016      Reactions   Codeine Sulfate Nausea And Vomiting, Other (See Comments)   headache      Medication List       Accurate as of 05/16/16  4:05 PM. Always use your most recent med list.          aspirin 81 MG tablet Take 81 mg by mouth daily.   atorvastatin 10 MG tablet Commonly known as:  LIPITOR Take 5 mg by mouth daily.   cholecalciferol 1000 units tablet Commonly known as:  VITAMIN D Take 3,000 Units by mouth daily.   ELIQUIS 2.5 MG Tabs tablet Generic drug:  apixaban Take 2.5 mg by mouth 2 (two) times daily.   furosemide 40 MG tablet Commonly known as:  LASIX One daily to help control edema   LUMIGAN 0.01 % Soln Generic drug:  bimatoprost Place 1 drop into both eyes at bedtime.   metoprolol succinate 50 MG 24 hr tablet Commonly known as:  TOPROL-XL One daily to regulate heart rhythm and for CHF   mometasone-formoterol 100-5 MCG/ACT Aero Commonly known as:  DULERA Inhale 2 puffs into the lungs. Inhale twice a day   naproxen sodium 220 MG tablet Commonly known as:  ANAPROX Take 220 mg by mouth daily as needed for pain.   OXYGEN Inhale into the lungs. 4 liter continuously   potassium chloride SA 20 MEQ tablet Commonly known as:  K-DUR,KLOR-CON Take 1 tablet (20 mEq total) by mouth daily.   predniSONE 5 MG tablet Commonly known as:  DELTASONE Take 5 mg by mouth. Take one tablet daily   traMADol-acetaminophen 37.5-325 MG tablet Commonly known as:  ULTRACET Take 1-2 tablets by mouth 2 (two) times daily as needed for moderate pain.   TRAVATAN Z 0.004 %  Soln ophthalmic solution Generic drug:  Travoprost (BAK Free) Place 1 drop into both eyes at bedtime.   vitamin E 1000 UNIT capsule Take 2,000 Units by mouth daily.       Review of Systems  Constitutional: Negative for activity change, appetite change, chills, diaphoresis, fatigue, fever and unexpected weight change.  HENT: Negative for congestion, ear discharge, ear pain, hearing loss, postnasal drip, rhinorrhea, sore throat, tinnitus, trouble swallowing and voice change.   Eyes: Negative for pain, redness, itching and visual disturbance.       Glaucoma  Respiratory: Positive for shortness of breath. Negative for cough, choking and wheezing.   Cardiovascular: Positive for palpitations and leg swelling. Negative for chest pain.       Diastolic HF  Gastrointestinal: Negative for abdominal distention, abdominal pain, constipation, diarrhea and nausea.       Diverticulosis and hx esophagitis and GI bleed on Xarelto  Endocrine: Negative for cold intolerance, heat intolerance, polydipsia, polyphagia and polyuria.       Multinodular goiter  Genitourinary: Negative for difficulty urinating, dysuria, flank pain, frequency, hematuria, pelvic pain, urgency and vaginal discharge.  Musculoskeletal: Positive for arthralgias and back pain. Negative for gait problem, myalgias, neck pain and neck stiffness.  Skin: Negative for color change, pallor and rash.  Allergic/Immunologic: Negative.   Neurological: Positive for weakness. Negative for dizziness, tremors, seizures, syncope, numbness and headaches.  Hematological: Negative for adenopathy. Does not bruise/bleed easily.  Psychiatric/Behavioral: Negative for agitation, behavioral problems, confusion, dysphoric mood, hallucinations, sleep disturbance and suicidal ideas. The patient is not nervous/anxious and is not hyperactive.     Vitals:   05/16/16 1408  BP: 106/76  Pulse: 65  Resp: 16  Temp: 97.6 F (36.4 C)  SpO2: 98%  Weight: 107 lb (48.5  kg)  Height: 5\' 3"  (1.6 m)   Body mass index is 18.95 kg/m. Physical Exam  Constitutional: She is oriented to person, place, and time. She appears well-developed and well-nourished. No distress.  HENT:  Right Ear: External ear normal.  Left Ear: External ear normal.  Nose: Nose normal.  Mouth/Throat: Oropharynx is clear and moist. No oropharyngeal exudate.  Eyes: Conjunctivae and EOM are normal. Pupils are equal, round, and reactive to light. No scleral icterus.  Corrective lenses  Neck: No JVD present. No tracheal deviation present. Thyromegaly present.  Cardiovascular: Normal rate, normal heart sounds and intact distal pulses.  Exam reveals no gallop and no friction rub.   No murmur heard. AF  Pulmonary/Chest: Effort normal. No respiratory distress. She has no wheezes. She has rales (lower lung bilaterally). She exhibits no tenderness.  Abdominal: She exhibits no distension and no mass. There is no tenderness.  Musculoskeletal: Normal range of motion. She exhibits edema (1-2+ bilateral). She exhibits no tenderness.  Mild low back pain to palpation  Lymphadenopathy:    She has no cervical adenopathy.  Neurological: She is alert and oriented to person, place, and time. No cranial nerve deficit. Coordination normal.  Skin: No rash noted. She is not diaphoretic. No erythema. No pallor.  Psychiatric: She has a normal mood and affect. Her behavior is normal. Judgment and thought content normal.    Labs reviewed: Basic Metabolic Panel:  Recent Labs  65/78/4599/12/23 0609 04/17/16 0526 04/18/16 0512 05/15/16  NA 138 134* 141 143  K 4.1 4.6 3.9 3.5  CL 108 103 105  --   CO2 21* 23 26  --   GLUCOSE 96 147* 101*  --   BUN 16 18 26* 42*  CREATININE 1.35* 1.46* 1.35* 1.9*  CALCIUM 8.0* 8.5* 9.0  --    Liver Function Tests:  Recent Labs  04/15/16 0609 05/15/16  AST 18 18  ALT 23 17  ALKPHOS 40 44  BILITOT 0.9  --   PROT 5.8*  --   ALBUMIN 3.0*  --    No results for input(s):  LIPASE, AMYLASE in the last 8760 hours. No results for input(s): AMMONIA in the last 8760 hours. CBC:  Recent Labs  04/14/16 1212 04/15/16 0609 04/17/16 0526  WBC 13.2* 9.9  9.9  NEUTROABS 9.8*  --   --   HGB 12.8 12.0 11.8*  HCT 37.7 35.8* 35.6*  MCV 93.1 91.3 92.5  PLT 317 298 323   Cardiac Enzymes: No results for input(s): CKTOTAL, CKMB, CKMBINDEX, TROPONINI in the last 8760 hours. BNP: Invalid input(s): POCBNP CBG: No results for input(s): GLUCAP in the last 8760 hours.  Procedures and Imaging Studies During Stay: Dg Chest 2 View  Result Date: 05/09/2016 CLINICAL DATA:  Community acquired pneumonia right upper lobe EXAM: CHEST  2 VIEW COMPARISON:  04/17/2016 FINDINGS: COPD with pulmonary hyperinflation. Right upper lobe infiltrate has cleared without significant residual infiltrate. No underlying mass. Interval clearing of bibasilar airspace disease and bilateral effusions. Lung bases now clear Atherosclerotic calcification aortic arch. Negative for heart failure. Apical scarring bilaterally. IMPRESSION: Right upper lobe pneumonia has cleared Interval clearing of bibasilar airspace disease and bilateral effusions. Electronically Signed   By: Marlan Palau M.D.   On: 05/09/2016 15:16   Dg Chest 2 View  Result Date: 04/17/2016 CLINICAL DATA:  Shortness of breath.  Pneumonia . EXAM: CHEST  2 VIEW COMPARISON:  04/16/2015 . FINDINGS: Mediastinum and hilar structures normal. COPD. Low lung volumes with mild bibasilar atelectasis. Interim near complete clearing of right upper lobe infiltrate. Small bilateral pleural effusions. Heart size normal. No acute bony abnormality . IMPRESSION: 1. Low lung volumes with mild bibasilar atelectasis and bilateral pleural effusions. Interim near complete clearing of right upper lobe infiltrate. 2. COPD. Electronically Signed   By: Maisie Fus  Register   On: 04/17/2016 10:11    Assessment/Plan:   Paroxysmal a-fib (HCC) Heart rate is in control, 05/01/16  decrease Metoprolol to 25mg  po qd  CHF (congestive heart failure) (HCC) Compensated, continue Furosemide, 04/25/16 CXR probable small bilateral pleural effusions suggesting mild fluid overload.  05/15/16 Na 143, K 3.5, Bun 42, creat 1.85, BNP 293.5, TSH 3.58  Chronic respiratory failure (HCC) Fully  Treated, hacking cough occasionally. Prednisone, Dulera. Wean off O2  COPD (chronic obstructive pulmonary disease) (HCC) Stable, prn O2  Thrush, oral Healed.   Multinodular goiter TSH 3.58 05/15/16  CKD (chronic kidney disease), stage III 05/15/16 Na 143, K 3.5, Bun 42, creat 1.85. Repeat BMP outpatient.   Protein-calorie malnutrition (HCC) 05/15/16 Total protein 6.4, albumin 3.8  PMR (polymyalgia rheumatica) (HCC) Taking Prednisone  Edema Trace edema BLE, continue Furosemide.      Patient is being discharged with the following home health services:    Patient is being discharged with the following durable medical equipment:    Patient has been advised to f/u with their PCP in 1-2 weeks to for a transitions of care visit.  Social services at their facility was responsible for arranging this appointment.  Pt was provided with adequate prescriptions of noncontrolled medications to reach the scheduled appointment .  For controlled substances, a limited supply was provided as appropriate for the individual patient.  If the pt normally receives these medications from a pain clinic or has a contract with another physician, these medications should be received from that clinic or physician only).    Future labs/tests needed:  BMP

## 2016-05-16 NOTE — Assessment & Plan Note (Signed)
Compensated, continue Furosemide, 04/25/16 CXR probable small bilateral pleural effusions suggesting mild fluid overload.  05/15/16 Na 143, K 3.5, Bun 42, creat 1.85, BNP 293.5, TSH 3.58

## 2016-05-16 NOTE — Assessment & Plan Note (Signed)
Stable, prn O2

## 2016-05-16 NOTE — Assessment & Plan Note (Signed)
Taking Prednisone

## 2016-05-16 NOTE — Assessment & Plan Note (Signed)
05/15/16 Total protein 6.4, albumin 3.8

## 2016-05-16 NOTE — Assessment & Plan Note (Signed)
05/15/16 Na 143, K 3.5, Bun 42, creat 1.85. Repeat BMP outpatient.

## 2016-05-16 NOTE — Assessment & Plan Note (Signed)
TSH 3.58 05/15/16

## 2016-05-16 NOTE — Assessment & Plan Note (Signed)
Healed

## 2016-05-16 NOTE — Assessment & Plan Note (Signed)
Fully  Treated, hacking cough occasionally. Prednisone, Dulera. Wean off O2

## 2016-07-18 ENCOUNTER — Ambulatory Visit (INDEPENDENT_AMBULATORY_CARE_PROVIDER_SITE_OTHER): Payer: Medicare Other | Admitting: Pulmonary Disease

## 2016-07-18 ENCOUNTER — Encounter: Payer: Self-pay | Admitting: Pulmonary Disease

## 2016-07-18 VITALS — BP 92/64 | HR 89 | Ht 63.5 in | Wt 109.0 lb

## 2016-07-18 DIAGNOSIS — R0609 Other forms of dyspnea: Secondary | ICD-10-CM

## 2016-07-18 DIAGNOSIS — I779 Disorder of arteries and arterioles, unspecified: Secondary | ICD-10-CM | POA: Diagnosis not present

## 2016-07-18 DIAGNOSIS — J432 Centrilobular emphysema: Secondary | ICD-10-CM

## 2016-07-18 DIAGNOSIS — J9611 Chronic respiratory failure with hypoxia: Secondary | ICD-10-CM

## 2016-07-18 MED ORDER — UMECLIDINIUM BROMIDE 62.5 MCG/INH IN AEPB: 1.0000 | INHALATION_SPRAY | Freq: Every day | RESPIRATORY_TRACT | 5 refills | Status: DC

## 2016-07-18 MED ORDER — ALBUTEROL SULFATE HFA 108 (90 BASE) MCG/ACT IN AERS: 2.0000 | INHALATION_SPRAY | Freq: Four times a day (QID) | RESPIRATORY_TRACT | 3 refills | Status: DC | PRN

## 2016-07-18 NOTE — Patient Instructions (Addendum)
Check with your eye doctor if you can use muscarinic type inhaler for COPD given history of glaucoma  Continue advair one puff twice per day  Proair two puffs every 4 to 6 hours as needed for cough, wheeze, chest congestion, or shortness of breath  Please start using 2 liters oxygen with activity and at night with sleep  Follow up in 2 months with Dr. Craige Cotta or Nurse Practitioner

## 2016-07-18 NOTE — Progress Notes (Signed)
Current Outpatient Prescriptions on File Prior to Visit  Medication Sig  . aspirin 81 MG tablet Take 81 mg by mouth daily.  Marland Kitchen atorvastatin (LIPITOR) 10 MG tablet Take 5 mg by mouth daily.   . cholecalciferol (VITAMIN D) 1000 UNITS tablet Take 3,000 Units by mouth daily.  Marland Kitchen ELIQUIS 2.5 MG TABS tablet Take 2.5 mg by mouth 2 (two) times daily.  . traMADol-acetaminophen (ULTRACET) 37.5-325 MG per tablet Take 1-2 tablets by mouth 2 (two) times daily as needed for moderate pain.   . vitamin E 1000 UNIT capsule Take 2,000 Units by mouth daily.   No current facility-administered medications on file prior to visit.      Chief Complaint  Patient presents with  . Follow-up    Pt c/o increased SOB with any exertion x 1 month+. Denies cough and wheezing. Pt notes her breathing worsened after she [facility].       Pulmonary tests Spirometry 04/17/16 >> FEV1 1.15 (70%), FEV1% 60 Ambulatory oximetry 07/18/16 >> 82% after 2 laps on room air  Cardiac tests Echo 04/15/16 >> EF 55%, PAS 33 mmHg  Past medical history A flutter, CKD 3, Glaucoma, HLD, PMR  Past surgical history, Family history, Social history, Allergies all reviewed.  Vital Signs BP 92/64 (BP Location: Left Arm, Cuff Size: Normal)   Pulse 89   Ht 5' 3.5" (1.613 m)   Wt 109 lb (49.4 kg)   SpO2 94%   BMI 19.01 kg/m   History of Present Illness [name] is a 83 y.o. female former smoker with COPD.  She has been noticed dyspnea with exertion >> walking about 100 feet.  She is not having cough, wheeze, or congestion.  She denies chest pain, fever, or swelling.  She uses advair bid and this helps, but doesn't seem to last long enough.  She doesn't have any other inhalers.  She was given script for oxygen from hospital, but never had this filled.  She isn't sure if she needs supplemental oxygen.  Her husband is retired Development worker, community.  He notices she sometimes goes into a flutter in the morning, and will notice she feels very  weak then.  Physical Exam  General - No distress ENT - No sinus tenderness, no oral exudate, no LAN Cardiac - s1s2 regular, no murmur Chest - No wheeze/rales/dullness Back - No focal tenderness Abd - Soft, non-tender Ext - No edema Neuro - Normal strength Skin - No rashes Psych - normal mood, and behavior   Assessment/Plan  COPD with emphysema. - continue advair - add prn proair - she will check with ophthalmologist about whether she can use LAMA with hx of glaucoma  Chronic respiratory failure with hypoxia. - will start her on 2 liters supplemental oxygen with exertion and sleep  Dyspnea on exertion. - if symptoms persist after optimizing COPD regimen, then she might need further cardiac assessment    Patient Instructions  Check with your eye doctor if you can use muscarinic type inhaler for COPD given history of glaucoma  Continue advair one puff twice per day  Proair two puffs every 4 to 6 hours as needed for cough, wheeze, chest congestion, or shortness of breath  Please start using 2 liters oxygen with activity and at night with sleep  Follow up in 2 months with Dr. Craige Cotta or Nurse Practitioner    Coralyn Helling, MD [facility] Pager:  [phone] 07/18/2016, 12:49 PM

## 2016-07-25 ENCOUNTER — Telehealth: Payer: Self-pay | Admitting: Pulmonary Disease

## 2016-07-25 DIAGNOSIS — J449 Chronic obstructive pulmonary disease, unspecified: Secondary | ICD-10-CM

## 2016-07-25 DIAGNOSIS — J9611 Chronic respiratory failure with hypoxia: Secondary | ICD-10-CM

## 2016-07-25 NOTE — Telephone Encounter (Signed)
Called and spoke with pts husband and he stated that the gentleman from [employer] came out to the house today and spoke with them about getting a battery operated compressor.  They are already approved via her insurance company.  They will just need the order sent in.  VS please advise. Thanks  Allergies  Allergen Reactions  . Codeine Sulfate Nausea And Vomiting and Other (See Comments)    headache

## 2016-07-28 NOTE — Telephone Encounter (Signed)
Spoke with pt's husband, aware of order being placed.  Nothing further needed.

## 2016-07-28 NOTE — Telephone Encounter (Signed)
Okay to send order. 

## 2016-07-29 ENCOUNTER — Telehealth: Payer: Self-pay | Admitting: Pulmonary Disease

## 2016-07-29 ENCOUNTER — Encounter (HOSPITAL_COMMUNITY): Payer: Self-pay | Admitting: Emergency Medicine

## 2016-07-29 ENCOUNTER — Emergency Department (HOSPITAL_COMMUNITY): Payer: Medicare Other

## 2016-07-29 ENCOUNTER — Inpatient Hospital Stay (HOSPITAL_COMMUNITY)
Admission: EM | Admit: 2016-07-29 | Discharge: 2016-08-01 | DRG: 291 | Disposition: A | Discharge status: Home / Self Care | Payer: Medicare Other | Attending: Internal Medicine | Admitting: Internal Medicine

## 2016-07-29 DIAGNOSIS — I5043 Acute on chronic combined systolic (congestive) and diastolic (congestive) heart failure: Secondary | ICD-10-CM | POA: Diagnosis not present

## 2016-07-29 DIAGNOSIS — Z7951 Long term (current) use of inhaled steroids: Secondary | ICD-10-CM

## 2016-07-29 DIAGNOSIS — J810 Acute pulmonary edema: Secondary | ICD-10-CM | POA: Diagnosis not present

## 2016-07-29 DIAGNOSIS — R0603 Acute respiratory distress: Secondary | ICD-10-CM | POA: Diagnosis present

## 2016-07-29 DIAGNOSIS — I272 Pulmonary hypertension, unspecified: Secondary | ICD-10-CM | POA: Diagnosis present

## 2016-07-29 DIAGNOSIS — Z7901 Long term (current) use of anticoagulants: Secondary | ICD-10-CM

## 2016-07-29 DIAGNOSIS — I2781 Cor pulmonale (chronic): Secondary | ICD-10-CM | POA: Diagnosis present

## 2016-07-29 DIAGNOSIS — J441 Chronic obstructive pulmonary disease with (acute) exacerbation: Secondary | ICD-10-CM | POA: Diagnosis not present

## 2016-07-29 DIAGNOSIS — N184 Chronic kidney disease, stage 4 (severe): Secondary | ICD-10-CM

## 2016-07-29 DIAGNOSIS — E7800 Pure hypercholesterolemia, unspecified: Secondary | ICD-10-CM | POA: Diagnosis present

## 2016-07-29 DIAGNOSIS — Z79899 Other long term (current) drug therapy: Secondary | ICD-10-CM

## 2016-07-29 DIAGNOSIS — I480 Paroxysmal atrial fibrillation: Secondary | ICD-10-CM | POA: Diagnosis present

## 2016-07-29 DIAGNOSIS — N183 Chronic kidney disease, stage 3 unspecified: Secondary | ICD-10-CM | POA: Diagnosis present

## 2016-07-29 DIAGNOSIS — Z9119 Patient's noncompliance with other medical treatment and regimen: Secondary | ICD-10-CM

## 2016-07-29 DIAGNOSIS — R0902 Hypoxemia: Secondary | ICD-10-CM

## 2016-07-29 DIAGNOSIS — I441 Atrioventricular block, second degree: Secondary | ICD-10-CM | POA: Diagnosis present

## 2016-07-29 DIAGNOSIS — Z7952 Long term (current) use of systemic steroids: Secondary | ICD-10-CM

## 2016-07-29 DIAGNOSIS — Z66 Do not resuscitate: Secondary | ICD-10-CM | POA: Diagnosis present

## 2016-07-29 DIAGNOSIS — T502X5A Adverse effect of carbonic-anhydrase inhibitors, benzothiadiazides and other diuretics, initial encounter: Secondary | ICD-10-CM | POA: Diagnosis not present

## 2016-07-29 DIAGNOSIS — I509 Heart failure, unspecified: Secondary | ICD-10-CM

## 2016-07-29 DIAGNOSIS — J9611 Chronic respiratory failure with hypoxia: Secondary | ICD-10-CM

## 2016-07-29 DIAGNOSIS — G8929 Other chronic pain: Secondary | ICD-10-CM

## 2016-07-29 DIAGNOSIS — Z87891 Personal history of nicotine dependence: Secondary | ICD-10-CM

## 2016-07-29 DIAGNOSIS — R0609 Other forms of dyspnea: Secondary | ICD-10-CM

## 2016-07-29 DIAGNOSIS — J9621 Acute and chronic respiratory failure with hypoxia: Secondary | ICD-10-CM | POA: Diagnosis present

## 2016-07-29 DIAGNOSIS — M7918 Myalgia, other site: Secondary | ICD-10-CM

## 2016-07-29 DIAGNOSIS — R0602 Shortness of breath: Secondary | ICD-10-CM | POA: Diagnosis not present

## 2016-07-29 DIAGNOSIS — E861 Hypovolemia: Secondary | ICD-10-CM | POA: Diagnosis not present

## 2016-07-29 DIAGNOSIS — E876 Hypokalemia: Secondary | ICD-10-CM | POA: Diagnosis not present

## 2016-07-29 DIAGNOSIS — Z885 Allergy status to narcotic agent status: Secondary | ICD-10-CM

## 2016-07-29 DIAGNOSIS — J449 Chronic obstructive pulmonary disease, unspecified: Secondary | ICD-10-CM | POA: Diagnosis not present

## 2016-07-29 DIAGNOSIS — I4892 Unspecified atrial flutter: Secondary | ICD-10-CM | POA: Diagnosis present

## 2016-07-29 DIAGNOSIS — E785 Hyperlipidemia, unspecified: Secondary | ICD-10-CM | POA: Diagnosis present

## 2016-07-29 DIAGNOSIS — Z9981 Dependence on supplemental oxygen: Secondary | ICD-10-CM

## 2016-07-29 DIAGNOSIS — I5031 Acute diastolic (congestive) heart failure: Secondary | ICD-10-CM

## 2016-07-29 DIAGNOSIS — M791 Myalgia: Secondary | ICD-10-CM

## 2016-07-29 DIAGNOSIS — I2510 Atherosclerotic heart disease of native coronary artery without angina pectoris: Secondary | ICD-10-CM

## 2016-07-29 DIAGNOSIS — E440 Moderate protein-calorie malnutrition: Secondary | ICD-10-CM | POA: Insufficient documentation

## 2016-07-29 DIAGNOSIS — Z7982 Long term (current) use of aspirin: Secondary | ICD-10-CM

## 2016-07-29 HISTORY — DX: Chronic obstructive pulmonary disease, unspecified: J44.9

## 2016-07-29 HISTORY — DX: Dyspnea, unspecified: R06.00

## 2016-07-29 LAB — TROPONIN I: Troponin I: <0.03 ng/mL

## 2016-07-29 LAB — I-STAT TROPONIN, ED: Troponin i, poc: 0.01 ng/mL (ref 0.00–0.08)

## 2016-07-29 LAB — BASIC METABOLIC PANEL
ANION GAP: 10 (ref 5–15)
BUN: 18 mg/dL (ref 6–20)
CHLORIDE: 105 mmol/L (ref 101–111)
CO2: 25 mmol/L (ref 22–32)
Calcium: 9.2 mg/dL (ref 8.9–10.3)
Creatinine, Ser: 1.74 mg/dL — ABNORMAL HIGH (ref 0.44–1.00)
GFR calc Af Amer: 29 mL/min — ABNORMAL LOW (ref >60)
GFR, EST NON AFRICAN AMERICAN: 25 mL/min — AB (ref >60)
GLUCOSE: 96 mg/dL (ref 65–99)
POTASSIUM: 3.4 mmol/L — AB (ref 3.5–5.1)
Sodium: 140 mmol/L (ref 135–145)

## 2016-07-29 LAB — CBC WITH DIFFERENTIAL/PLATELET
Basophils Absolute: 0 K/uL (ref 0.0–0.1)
Basophils Relative: 0 %
Eosinophils Absolute: 0.3 K/uL (ref 0.0–0.7)
Eosinophils Relative: 3 %
HCT: 48.9 % — ABNORMAL HIGH (ref 36.0–46.0)
Hemoglobin: 16.2 g/dL — ABNORMAL HIGH (ref 12.0–15.0)
Lymphocytes Relative: 16 %
Lymphs Abs: 1.6 K/uL (ref 0.7–4.0)
MCH: 31.1 pg (ref 26.0–34.0)
MCHC: 33.1 g/dL (ref 30.0–36.0)
MCV: 93.9 fL (ref 78.0–100.0)
Monocytes Absolute: 1.3 K/uL — ABNORMAL HIGH (ref 0.1–1.0)
Monocytes Relative: 14 %
Neutro Abs: 6.5 K/uL (ref 1.7–7.7)
Neutrophils Relative %: 67 %
Platelets: 291 K/uL (ref 150–400)
RBC: 5.21 MIL/uL — ABNORMAL HIGH (ref 3.87–5.11)
RDW: 14.3 % (ref 11.5–15.5)
WBC: 9.7 K/uL (ref 4.0–10.5)

## 2016-07-29 LAB — PROCALCITONIN

## 2016-07-29 LAB — BRAIN NATRIURETIC PEPTIDE: B NATRIURETIC PEPTIDE 5: 218.4 pg/mL — AB (ref 0.0–100.0)

## 2016-07-29 MED ORDER — ALBUTEROL SULFATE HFA 108 (90 BASE) MCG/ACT IN AERS: 2.0000 | INHALATION_SPRAY | Freq: Four times a day (QID) | RESPIRATORY_TRACT | Status: DC | PRN

## 2016-07-29 MED ORDER — DILTIAZEM HCL 60 MG PO TABS
240.0000 mg | ORAL_TABLET | Freq: Every day | ORAL | Status: DC
  Administered 2016-07-30 – 2016-08-01 (×3): 240 mg via ORAL
  Filled 2016-07-29 (×4): qty 4

## 2016-07-29 MED ORDER — SODIUM CHLORIDE 0.9% FLUSH: 3.0000 mL | INTRAVENOUS | Status: DC | PRN

## 2016-07-29 MED ORDER — ONDANSETRON HCL 4 MG/2ML IJ SOLN: 4.0000 mg | Freq: Four times a day (QID) | INTRAMUSCULAR | Status: DC | PRN

## 2016-07-29 MED ORDER — UMECLIDINIUM BROMIDE 62.5 MCG/INH IN AEPB
1.0000 | INHALATION_SPRAY | Freq: Every day | RESPIRATORY_TRACT | Status: DC
  Administered 2016-07-30 – 2016-07-31 (×3): 1 via RESPIRATORY_TRACT
  Filled 2016-07-29: qty 7

## 2016-07-29 MED ORDER — ATORVASTATIN CALCIUM 10 MG PO TABS
5.0000 mg | ORAL_TABLET | Freq: Every day | ORAL | Status: DC
  Administered 2016-07-29 – 2016-07-31 (×3): 5 mg via ORAL
  Filled 2016-07-29 (×3): qty 1

## 2016-07-29 MED ORDER — FUROSEMIDE 10 MG/ML IJ SOLN
40.0000 mg | Freq: Once | INTRAMUSCULAR | Status: AC
  Administered 2016-07-29: 40 mg via INTRAVENOUS
  Filled 2016-07-29: qty 4

## 2016-07-29 MED ORDER — FUROSEMIDE 10 MG/ML IJ SOLN
40.0000 mg | Freq: Two times a day (BID) | INTRAMUSCULAR | Status: DC
  Administered 2016-07-29 – 2016-07-30 (×2): 40 mg via INTRAVENOUS
  Filled 2016-07-29 (×2): qty 4

## 2016-07-29 MED ORDER — ONDANSETRON HCL 4 MG PO TABS: 4.0000 mg | ORAL_TABLET | Freq: Four times a day (QID) | ORAL | Status: DC | PRN

## 2016-07-29 MED ORDER — ASPIRIN 81 MG PO TABS: 81.0000 mg | ORAL_TABLET | Freq: Every day | ORAL | Status: DC

## 2016-07-29 MED ORDER — PREDNISONE 5 MG PO TABS
5.0000 mg | ORAL_TABLET | Freq: Every day | ORAL | Status: DC
  Administered 2016-07-30 – 2016-08-01 (×3): 5 mg via ORAL
  Filled 2016-07-29 (×4): qty 1

## 2016-07-29 MED ORDER — UMECLIDINIUM BROMIDE 62.5 MCG/INH IN AEPB
1.0000 | INHALATION_SPRAY | Freq: Every day | RESPIRATORY_TRACT | Status: DC
  Filled 2016-07-29: qty 7

## 2016-07-29 MED ORDER — TRAMADOL-ACETAMINOPHEN 37.5-325 MG PO TABS: 1.0000 | ORAL_TABLET | Freq: Two times a day (BID) | ORAL | Status: DC | PRN

## 2016-07-29 MED ORDER — APIXABAN 2.5 MG PO TABS
2.5000 mg | ORAL_TABLET | Freq: Two times a day (BID) | ORAL | Status: DC
  Administered 2016-07-29 – 2016-08-01 (×7): 2.5 mg via ORAL
  Filled 2016-07-29 (×7): qty 1

## 2016-07-29 MED ORDER — MOMETASONE FURO-FORMOTEROL FUM 200-5 MCG/ACT IN AERO
2.0000 | INHALATION_SPRAY | Freq: Two times a day (BID) | RESPIRATORY_TRACT | Status: DC
  Administered 2016-07-29 – 2016-08-01 (×6): 2 via RESPIRATORY_TRACT
  Filled 2016-07-29: qty 8.8

## 2016-07-29 MED ORDER — SODIUM CHLORIDE 0.9 % IV SOLN: 250.0000 mL | INTRAVENOUS | Status: DC | PRN

## 2016-07-29 MED ORDER — SODIUM CHLORIDE 0.9% FLUSH
3.0000 mL | Freq: Two times a day (BID) | INTRAVENOUS | Status: DC
  Administered 2016-07-29 – 2016-08-01 (×6): 3 mL via INTRAVENOUS

## 2016-07-29 MED ORDER — ALBUTEROL SULFATE (2.5 MG/3ML) 0.083% IN NEBU: 2.5000 mg | INHALATION_SOLUTION | Freq: Four times a day (QID) | RESPIRATORY_TRACT | Status: DC | PRN

## 2016-07-29 MED ORDER — ASPIRIN EC 81 MG PO TBEC
81.0000 mg | DELAYED_RELEASE_TABLET | Freq: Every day | ORAL | Status: DC
  Administered 2016-07-30: 81 mg via ORAL
  Filled 2016-07-29 (×2): qty 1

## 2016-07-29 MED ORDER — ENSURE ENLIVE PO LIQD
237.0000 mL | Freq: Two times a day (BID) | ORAL | Status: DC
  Administered 2016-07-30 – 2016-07-31 (×3): 237 mL via ORAL

## 2016-07-29 NOTE — ED Notes (Signed)
Patient transported to CT 

## 2016-07-29 NOTE — ED Provider Notes (Signed)
[facility]-EMERGENCY DEPT Provider Note   CSN: [id number] Arrival date & time: 07/29/16  4696     History   Chief Complaint Chief Complaint  Patient presents with  . Shortness of Breath    HPI [name] is a 83 y.o. female.  Pt presents to the ED today with ongoing SOB since January after pneumonia/flu.  The pt has been seeing pulmonology (Dr. Craige Cotta) and is now on oxygen.  The pt has been diagnosed with COPD.  Pt said she still has SOB.  She denies any cp.  No fever.  Pt's husband called EMS this morning because he thought she was having a harder time breathing.  The pt does not feel like she is any worse than normal.  Pt was having some swelling in her feet and started a diuretic a few days ago.  The swelling is better.  Pt does have a hx of a.flutter and is anticoagulated on eliquis.  (chadvasc 4).  She's been compliant with her meds.      Past Medical History:  Diagnosis Date  . Anticoagulated    Eliquis  . Atrial flutter (HCC)    NO CARDIOLOGIST FOLLOWED BY DR Tori Milks  . Bloody stools    LAST 4 DAYS  . CHF (congestive heart failure) (HCC)   . Chronic back pain   . CKD (chronic kidney disease), stage III    Creatinine 1.35 -1.55  . Diverticulosis   . Dysrhythmia   . Edema   . Gait disorder   . Glaucoma    Severe  . Glaucoma    both eyes  . High cholesterol   . Multinodular goiter   . PAOD (peripheral arterial occlusive disease) (HCC)    Stein in right groin, decrease pulses  . PMR (polymyalgia rheumatica) (HCC)   . Pneumonia, organism unspecified(486) 04/18/2016  . Protein-calorie malnutrition (HCC)   . Rash of back   . Weakness     Patient Active Problem List   Diagnosis Date Noted  . Respiratory distress 07/29/2016  . Chronic respiratory failure (HCC) 05/09/2016  . COPD (chronic obstructive pulmonary disease) (HCC) 05/09/2016  . Thrush, oral 05/02/2016  . Dyspnea 04/25/2016  . Gait disorder   . Weakness   . CKD (chronic kidney disease), stage  III   . Protein-calorie malnutrition (HCC)   . Multinodular goiter   . PAOD (peripheral arterial occlusive disease) (HCC)   . Chronic back pain   . CHF (congestive heart failure) (HCC)   . Atrial flutter (HCC)   . Edema   . Glaucoma   . High cholesterol   . PMR (polymyalgia rheumatica) (HCC)   . CAP (community acquired pneumonia)   . Paroxysmal A-fib (HCC) 04/14/2016  . Chronic anticoagulation 04/14/2016  . Diverticulosis of colon (without mention of hemorrhage) 07/15/2013  . Arthropathy 04/06/2007  . ESOPHAGITIS 03/29/2007    Past Surgical History:  Procedure Laterality Date  . COLONOSCOPY N/A 07/15/2013   Procedure: COLONOSCOPY;  Surgeon: Louis Meckel, MD;  Location: [facility];  Service: Endoscopy;  Laterality: N/A;  . ESOPHAGOGASTRODUODENOSCOPY N/A 07/08/2013   Procedure: ESOPHAGOGASTRODUODENOSCOPY (EGD);  Surgeon: Louis Meckel, MD;  Location: [facility];  Service: Endoscopy;  Laterality: N/A;  Per Connye Burkitt, will try to have CRNA available, but will not guaranteee. Ok to schedule per Clydie Braun 07/06/13  . GALLBLADDER SURGERY    . IR GENERIC HISTORICAL  05/18/2014   IR RADIOLOGIST EVAL & MGMT 05/18/2014 Berdine Dance, MD [facility] INTERV RAD  . LAMINECTOMY    .  leg stent Right   . SHOULDER SURGERY     frozen shoulder/ Bil shoulders    OB History    No data available       Home Medications    Prior to Admission medications   Medication Sig Start Date End Date Taking? Authorizing Provider  acetaminophen (TYLENOL) 500 MG tablet Per bottle as needed    Historical Provider, MD  albuterol (PROAIR HFA) 108 (90 Base) MCG/ACT inhaler Inhale 2 puffs into the lungs every 6 (six) hours as needed for wheezing or shortness of breath. 07/18/16   Coralyn Helling, MD  aspirin 81 MG tablet Take 81 mg by mouth daily.    Historical Provider, MD  atorvastatin (LIPITOR) 10 MG tablet Take 5 mg by mouth daily.     Historical Provider, MD  cholecalciferol (VITAMIN D) 1000 UNITS tablet Take 3,000  Units by mouth daily.    Historical Provider, MD  diltiazem (CARDIZEM) 120 MG tablet 2 tablets by mouth daily    Historical Provider, MD  ELIQUIS 2.5 MG TABS tablet Take 2.5 mg by mouth 2 (two) times daily. 04/11/16   Historical Provider, MD  Fluticasone-Salmeterol (ADVAIR) 250-50 MCG/DOSE AEPB Inhale 1 puff into the lungs 2 (two) times daily.    Historical Provider, MD  predniSONE (DELTASONE) 10 MG tablet Take 10 mg by mouth daily with breakfast.    Historical Provider, MD  traMADol-acetaminophen (ULTRACET) 37.5-325 MG per tablet Take 1-2 tablets by mouth 2 (two) times daily as needed for moderate pain.  06/01/13   Historical Provider, MD  vitamin E 1000 UNIT capsule Take 2,000 Units by mouth daily.    Historical Provider, MD    Family History Family History  Problem Relation Age of Onset  . Throat cancer Mother   . Diabetes Maternal Uncle   . Throat cancer Maternal Grandfather   . Cirrhosis Maternal Aunt     Social History Social History  Substance Use Topics  . Smoking status: Former Smoker    Types: Cigarettes    Quit date: 07/12/1993  . Smokeless tobacco: Never Used  . Alcohol use No     Allergies   Codeine sulfate   Review of Systems Review of Systems  Respiratory: Positive for shortness of breath.   All other systems reviewed and are negative.    Physical Exam Updated Vital Signs BP 136/67   Pulse 85   Temp 97.4 F (36.3 C) (Oral)   Resp 14   Ht 5' 3.5" (1.613 m)   Wt 109 lb (49.4 kg)   SpO2 99%   BMI 19.01 kg/m   Physical Exam  Constitutional: She is oriented to person, place, and time. She appears well-developed. She appears distressed.  HENT:  Head: Normocephalic and atraumatic.  Right Ear: External ear normal.  Left Ear: External ear normal.  Nose: Nose normal.  Mouth/Throat: Oropharynx is clear and moist.  Eyes: Conjunctivae and EOM are normal. Pupils are equal, round, and reactive to light.  Neck: Normal range of motion. Neck supple.    Cardiovascular: Normal rate, regular rhythm, normal heart sounds and intact distal pulses.   Pulmonary/Chest: Breath sounds normal. Tachypnea noted. She is in respiratory distress.  Abdominal: Soft. Bowel sounds are normal.  Musculoskeletal: Normal range of motion.  Neurological: She is alert and oriented to person, place, and time.  Skin: Skin is warm and dry.  Psychiatric: She has a normal mood and affect. Her behavior is normal. Judgment and thought content normal.  Nursing note and vitals reviewed.  ED Treatments / Results  Labs (all labs ordered are listed, but only abnormal results are displayed) Labs Reviewed  BASIC METABOLIC PANEL - Abnormal; Notable for the following:       Result Value   Potassium 3.4 (*)    Creatinine, Ser 1.74 (*)    GFR calc non Af Amer 25 (*)    GFR calc Af Amer 29 (*)    All other components within normal limits  CBC WITH DIFFERENTIAL/PLATELET - Abnormal; Notable for the following:    RBC 5.21 (*)    Hemoglobin 16.2 (*)    HCT 48.9 (*)    Monocytes Absolute 1.3 (*)    All other components within normal limits  BRAIN NATRIURETIC PEPTIDE - Abnormal; Notable for the following:    B Natriuretic Peptide 218.4 (*)    All other components within normal limits  TROPONIN I  I-STAT TROPOININ, ED    EKG  EKG Interpretation  Date/Time:  Tuesday July 29 2016 09:42:30 EDT Ventricular Rate:  74 PR Interval:    QRS Duration: 104 QT Interval:  394 QTC Calculation: 438 R Axis:   75 Text Interpretation:  Atrial flutter with predominant 4:1 AV block Anteroseptal infarct, age indeterminate No significant change since last tracing Confirmed by [facility] MD, Kimarion Chery [phone]) on 07/29/2016 9:50:46 AM Also confirmed by Particia Nearing MD, Gracelin Weisberg [phone]), editor Misty Stanley [phone])  on 07/29/2016 10:55:52 AM       Radiology Dg Chest 2 View  Result Date: 07/29/2016 CLINICAL DATA:  Shortness of breath today, coughing for few weeks, dry cough, history atrial  fibrillation/flutter, chronic kidney disease, COPD, former smoker EXAM: CHEST  2 VIEW COMPARISON:  05/09/2016 FINDINGS: Upper normal size of cardiac silhouette. Atherosclerotic calcification aorta. Mediastinal contours and pulmonary vascularity normal. Small bibasilar pleural effusions. Underlying emphysematous and chronic bronchitic changes consistent with COPD. Questionable RIGHT basilar infiltrate. No pneumothorax. Bones demineralized. IMPRESSION: COPD changes with small BILATERAL pleural effusions and questionable RIGHT basilar infiltrate. Aortic atherosclerosis. Electronically Signed   By: Ulyses Southward M.D.   On: 07/29/2016 10:19   Ct Chest Wo Contrast  Result Date: 07/29/2016 CLINICAL DATA:  Shortness of breath since January. Status post flu/pneumonia. EXAM: CT CHEST WITHOUT CONTRAST TECHNIQUE: Multidetector CT imaging of the chest was performed following the standard protocol without IV contrast. COMPARISON:  Plain films 07/29/2016.  No prior CT. FINDINGS: Cardiovascular: Aortic and branch vessel atherosclerosis. Aberrant right subclavian artery, traversing posterior to the esophagus. Mild cardiomegaly. Multivessel coronary artery atherosclerosis. Mediastinum/Nodes: Borderline precarinal adenopathy at 10 mm on image 66/series 3. Hilar regions poorly evaluated without intravenous contrast. Lungs/Pleura: Small, right larger than left pleural effusions. Moderate to marked centrilobular emphysema. Mild biapical pleural-parenchymal scarring. Dependent right base opacity is most consistent with atelectasis and scar. Upper Abdomen: Cholecystectomy. Normal imaged portions of the spleen, stomach, pancreas, adrenal glands. Musculoskeletal: Thoracolumbar spondylosis with S-shaped thoracic spine curvature. IMPRESSION: 1. Moderate-to-marked emphysema. 2. Cardiomegaly with small bilateral pleural effusions. Question fluid overload or mild CHF. 3.  Coronary artery atherosclerosis. Aortic atherosclerosis. 4. Borderline  thoracic adenopathy, favored to be reactive. Electronically Signed   By: Jeronimo Greaves M.D.   On: 07/29/2016 12:12    Procedures Procedures (including critical care time)  Medications Ordered in ED Medications  furosemide (LASIX) injection 40 mg (40 mg Intravenous Given 07/29/16 1239)     Initial Impression / Assessment and Plan / ED Course  I have reviewed the triage vital signs and the nursing notes.  Pertinent labs & imaging results that were  available during my care of the patient were reviewed by me and considered in my medical decision making (see chart for details).    When pt moves, her RR goes way up and she looks very sob.  O2 sat ok on oxygen.  Pt was just put on lasix a few days ago and sob is not improving.  I spoke with Dr. Konrad Dolores ([facility]) who will see her.  Final Clinical Impressions(s) / ED Diagnoses   Final diagnoses:  Acute congestive heart failure, unspecified heart failure type (HCC)  Dyspnea on exertion  CRI (chronic renal insufficiency), stage 4 (severe) (HCC)    New Prescriptions New Prescriptions   No medications on file     Jacalyn Lefevre, MD 07/29/16 1242

## 2016-07-29 NOTE — Telephone Encounter (Signed)
Please close, message was not needed.

## 2016-07-29 NOTE — H&P (Signed)
History and Physical    [name]:[id number] DOB: 1927-09-01 DOA: 07/29/2016  PCP: Enrique Sack, MD Patient coming from: Home  Chief Complaint: SOB, difficulty breathing  HPI: [name] is a 83 y.o. female with medical history significant of atrial flutter on pelvic rest, congestive heart failure, C KD, glaucoma, diverticulosis, hyperlipidemia, PVD, protein calorie malnutrition.    Patient presented to [facility] emergency room via EMS after family became very concerned over her respiratory status. Patient uses 2 L of O2 by nasal cannula which has not changed. Home O2 saturations remained above 90% on this regimen though she has had marked increased respiratory effort during this time. Symptoms are worsened with exertion and improved with rest. When off of her home oxygen her saturations dropped into the mid 60s at times. Patient endorsing an occasional dry cough but denies fevers, chest pain, palpitations, abdominal pain, dysuria, frequency, flank pain, neck stiffness, headache, focal neurological deficit. Symptoms have been ongoing for the last 2 weeks and were associated with lower extremity edema which family states was large as 4+. Patient with a general physical and respiratory decline ever since contracting pneumonia in January 2018. On 07/25/2016 patient was started on Lasix 20 mg daily. In a short amount of time this resolved patient's lower extremity edema. Family denies any known episodes of RVR that may be related to her feelings of shortness of breath and fatigue and weakness. Of note patient was started on oxygen a couple of weeks ago by her pulmonologist, Dr. Craige Cotta.    ED Course: Objective findings outlined below. Lasix 40 mg IV given 1.  Review of Systems: As per HPI otherwise 10 point review of systems negative.   Ambulatory Status: General deconditioning but no focal deficits requiring ambulatory assistance.  Past Medical History:  Diagnosis Date  .  Anticoagulated    Eliquis  . Atrial flutter (HCC)    NO CARDIOLOGIST FOLLOWED BY DR Tori Milks  . Bloody stools    LAST 4 DAYS  . CHF (congestive heart failure) (HCC)   . Chronic back pain   . CKD (chronic kidney disease), stage III    Creatinine 1.35 -1.55  . COPD (chronic obstructive pulmonary disease) (HCC)   . Diverticulosis   . Dysrhythmia   . Edema   . Gait disorder   . Glaucoma    Severe  . Glaucoma    both eyes  . High cholesterol   . Multinodular goiter   . PAOD (peripheral arterial occlusive disease) (HCC)    Stein in right groin, decrease pulses  . PMR (polymyalgia rheumatica) (HCC)   . Pneumonia, organism unspecified(486) 04/18/2016  . Protein-calorie malnutrition (HCC)   . Rash of back   . Weakness     Past Surgical History:  Procedure Laterality Date  . COLONOSCOPY N/A 07/15/2013   Procedure: COLONOSCOPY;  Surgeon: Louis Meckel, MD;  Location: [facility];  Service: Endoscopy;  Laterality: N/A;  . ESOPHAGOGASTRODUODENOSCOPY N/A 07/08/2013   Procedure: ESOPHAGOGASTRODUODENOSCOPY (EGD);  Surgeon: Louis Meckel, MD;  Location: [facility];  Service: Endoscopy;  Laterality: N/A;  Per Connye Burkitt, will try to have CRNA available, but will not guaranteee. Ok to schedule per Clydie Braun 07/06/13  . GALLBLADDER SURGERY    . IR GENERIC HISTORICAL  05/18/2014   IR RADIOLOGIST EVAL & MGMT 05/18/2014 Berdine Dance, MD [facility] INTERV RAD  . LAMINECTOMY    . leg stent Right   . SHOULDER SURGERY     frozen shoulder/ Bil  shoulders    Social History   Social History  . Marital status: Married    Spouse name: N/A  . Number of children: 4  . Years of education: N/A   Occupational History  . Retired    Social History Main Topics  . Smoking status: Former Smoker    Types: Cigarettes    Quit date: 07/12/1993  . Smokeless tobacco: Never Used  . Alcohol use No  . Drug use: No  . Sexual activity: Not on file   Other Topics Concern  . Not on file   Social History  Narrative   Married -Dr. Aura Fey   Former smoker - stopped 1995   Alcohol none   POA, Living Will    Allergies  Allergen Reactions  . Codeine Sulfate Nausea And Vomiting and Other (See Comments)    headache    Family History  Problem Relation Age of Onset  . Throat cancer Mother   . Diabetes Maternal Uncle   . Throat cancer Maternal Grandfather   . Cirrhosis Maternal Aunt     Prior to Admission medications   Medication Sig Start Date End Date Taking? Authorizing Provider  acetaminophen (TYLENOL) 500 MG tablet Per bottle as needed    Historical Provider, MD  albuterol (PROAIR HFA) 108 (90 Base) MCG/ACT inhaler Inhale 2 puffs into the lungs every 6 (six) hours as needed for wheezing or shortness of breath. 07/18/16   Coralyn Helling, MD  aspirin 81 MG tablet Take 81 mg by mouth daily.    Historical Provider, MD  atorvastatin (LIPITOR) 10 MG tablet Take 5 mg by mouth daily.     Historical Provider, MD  cholecalciferol (VITAMIN D) 1000 UNITS tablet Take 3,000 Units by mouth daily.    Historical Provider, MD  diltiazem (CARDIZEM) 120 MG tablet 2 tablets by mouth daily    Historical Provider, MD  ELIQUIS 2.5 MG TABS tablet Take 2.5 mg by mouth 2 (two) times daily. 04/11/16   Historical Provider, MD  Fluticasone-Salmeterol (ADVAIR) 250-50 MCG/DOSE AEPB Inhale 1 puff into the lungs 2 (two) times daily.    Historical Provider, MD  predniSONE (DELTASONE) 10 MG tablet Take 10 mg by mouth daily with breakfast.    Historical Provider, MD  traMADol-acetaminophen (ULTRACET) 37.5-325 MG per tablet Take 1-2 tablets by mouth 2 (two) times daily as needed for moderate pain.  06/01/13   Historical Provider, MD  vitamin E 1000 UNIT capsule Take 2,000 Units by mouth daily.    Historical Provider, MD    Physical Exam: Vitals:   07/29/16 1300 07/29/16 1330 07/29/16 1358 07/29/16 1400  BP: 132/73 116/73  115/74  Pulse: 77   76  Resp: (!) 22 (!) 24 19   Temp:      TempSrc:      SpO2: 97%   96%    Weight:      Height:         General:  Appears calm and comfortable Eyes:  PERRL, EOMI, normal lids, iris ENT:  grossly normal hearing, lips & tongue, mmm Neck:  no LAD, masses or thyromegaly Cardiovascular:  Faint cardiac sounds, no JVD, no carotid bruits, irregularly irregular, no lower extremity edema  Respiratory:  Mild increased effort especially after periods of speaking, crackles in the bases bilaterally with left worse than right, on 2 L nasal cannula with O2 saturations greater than 90%  Abdomen:  soft, ntnd, NABS Skin:  no rash or induration seen on limited exam Musculoskeletal:  grossly  normal tone BUE/BLE, good ROM, no bony abnormality Psychiatric:  grossly normal mood and affect, speech fluent and appropriate, AOx3 Neurologic:  CN 2-12 grossly intact, moves all extremities in coordinated fashion, sensation intact  Labs on Admission: I have personally reviewed following labs and imaging studies  CBC:  Recent Labs Lab 07/29/16 0941  WBC 9.7  NEUTROABS 6.5  HGB 16.2*  HCT 48.9*  MCV 93.9  PLT 291   Basic Metabolic Panel:  Recent Labs Lab 07/29/16 0941  NA 140  K 3.4*  CL 105  CO2 25  GLUCOSE 96  BUN 18  CREATININE 1.74*  CALCIUM 9.2   GFR: Estimated Creatinine Clearance: 17.8 mL/min (A) (by C-G formula based on SCr of 1.74 mg/dL (H)). Liver Function Tests: No results for input(s): AST, ALT, ALKPHOS, BILITOT, PROT, ALBUMIN in the last 168 hours. No results for input(s): LIPASE, AMYLASE in the last 168 hours. No results for input(s): AMMONIA in the last 168 hours. Coagulation Profile: No results for input(s): INR, PROTIME in the last 168 hours. Cardiac Enzymes:  Recent Labs Lab 07/29/16 0941  TROPONINI <0.03   BNP (last 3 results) No results for input(s): PROBNP in the last 8760 hours. HbA1C: No results for input(s): HGBA1C in the last 72 hours. CBG: No results for input(s): GLUCAP in the last 168 hours. Lipid Profile: No results for  input(s): CHOL, HDL, LDLCALC, TRIG, CHOLHDL, LDLDIRECT in the last 72 hours. Thyroid Function Tests: No results for input(s): TSH, T4TOTAL, FREET4, T3FREE, THYROIDAB in the last 72 hours. Anemia Panel: No results for input(s): VITAMINB12, FOLATE, FERRITIN, TIBC, IRON, RETICCTPCT in the last 72 hours. Urine analysis:    Component Value Date/Time   COLORURINE STRAW (A) 04/14/2016 1345   APPEARANCEUR CLEAR 04/14/2016 1345   LABSPEC 1.003 (L) 04/14/2016 1345   PHURINE 6.0 04/14/2016 1345   GLUCOSEU NEGATIVE 04/14/2016 1345   HGBUR NEGATIVE 04/14/2016 1345   BILIRUBINUR NEGATIVE 04/14/2016 1345   KETONESUR NEGATIVE 04/14/2016 1345   PROTEINUR NEGATIVE 04/14/2016 1345   NITRITE NEGATIVE 04/14/2016 1345   LEUKOCYTESUR NEGATIVE 04/14/2016 1345    Creatinine Clearance: Estimated Creatinine Clearance: 17.8 mL/min (A) (by C-G formula based on SCr of 1.74 mg/dL (H)).  Sepsis Labs: (procalcitonin:4,lacticidven:4) )No results found for this or any previous visit (from the past 240 hour(s)).   Radiological Exams on Admission: Dg Chest 2 View  Result Date: 07/29/2016 CLINICAL DATA:  Shortness of breath today, coughing for few weeks, dry cough, history atrial fibrillation/flutter, chronic kidney disease, COPD, former smoker EXAM: CHEST  2 VIEW COMPARISON:  05/09/2016 FINDINGS: Upper normal size of cardiac silhouette. Atherosclerotic calcification aorta. Mediastinal contours and pulmonary vascularity normal. Small bibasilar pleural effusions. Underlying emphysematous and chronic bronchitic changes consistent with COPD. Questionable RIGHT basilar infiltrate. No pneumothorax. Bones demineralized. IMPRESSION: COPD changes with small BILATERAL pleural effusions and questionable RIGHT basilar infiltrate. Aortic atherosclerosis. Electronically Signed   By: Ulyses Southward M.D.   On: 07/29/2016 10:19   Ct Chest Wo Contrast  Result Date: 07/29/2016 CLINICAL DATA:  Shortness of breath since January.  Status post flu/pneumonia. EXAM: CT CHEST WITHOUT CONTRAST TECHNIQUE: Multidetector CT imaging of the chest was performed following the standard protocol without IV contrast. COMPARISON:  Plain films 07/29/2016.  No prior CT. FINDINGS: Cardiovascular: Aortic and branch vessel atherosclerosis. Aberrant right subclavian artery, traversing posterior to the esophagus. Mild cardiomegaly. Multivessel coronary artery atherosclerosis. Mediastinum/Nodes: Borderline precarinal adenopathy at 10 mm on image 66/series 3. Hilar regions poorly evaluated without intravenous contrast. Lungs/Pleura: Small, right  larger than left pleural effusions. Moderate to marked centrilobular emphysema. Mild biapical pleural-parenchymal scarring. Dependent right base opacity is most consistent with atelectasis and scar. Upper Abdomen: Cholecystectomy. Normal imaged portions of the spleen, stomach, pancreas, adrenal glands. Musculoskeletal: Thoracolumbar spondylosis with S-shaped thoracic spine curvature. IMPRESSION: 1. Moderate-to-marked emphysema. 2. Cardiomegaly with small bilateral pleural effusions. Question fluid overload or mild CHF. 3.  Coronary artery atherosclerosis. Aortic atherosclerosis. 4. Borderline thoracic adenopathy, favored to be reactive. Electronically Signed   By: Jeronimo Greaves M.D.   On: 07/29/2016 12:12    EKG: Independently reviewed. Aflutter. simliar to previous  Assessment/Plan Active Problems:   CKD (chronic kidney disease), stage III   High cholesterol   COPD (chronic obstructive pulmonary disease) (HCC)   Acute respiratory distress   CAD (coronary artery disease)   Chronic musculoskeletal pain   COPD without exacerbation (HCC)   Acute on chronic respiratory failure: secondary to acute on chronic diastolic CHF w/ underlying advanced COPD w/o exacerbation. CXR and CT as above w/o evidence of infectious process. Currently on baseline 2L [place] w/ nml O2 sats. Respiratory status has gradually worsened since  contracting pneumonia in January 2018. Started lasix  Qday 4 days ago w/o improvement in respiratory status. Echo from 05/2016 w/ EF 55% but unable to eval diastolic function, but increased PA pressure noted at 33. Echo prior to onset of Afib noting diastolic dysfunction.  - Lasix  IV BID - Strict I/O, Daily wts.  - Pulm consult per pt request.  - Continue Prednisone, O2, albuterol, Advair - Procalcitonin  Aflutter: chronic. Rate controlled - Continue Eliquis, Dilt  CKD: Cr 1.78. unclear baseline but likely similar. Anticipate temporary worsening given diuresis - BMET in am  CAD:  - continue statin and ASA  MSK pain: at baseline  - continue tramadol  DVT prophylaxis: Eliquis  Code Status: FULL - confirmed w/ pt, husband and daughter Family Communication: Husband and daughter  Disposition Plan: pending improvement  Consults called: Pulmonology  Admission status: observation - tele    MERRELL, DAVID J MD [facility]  If 7PM-7AM, please contact night-coverage [email or web address] Password [facility]  07/29/2016, 2:33 PM

## 2016-07-29 NOTE — ED Triage Notes (Signed)
Pt from home via [facility] with c/o ongoing SOB since Jan s/p flu/pneumonia.  Pt reports SOB has not had any change but husband called EMS today.  Pt using accessory muscles and pursed lips.  SpO2 99% on normal 2L nasal cannula.  Lungs clear.  Given 324 mg aspirin.  NAD, A&O.

## 2016-07-29 NOTE — Consult Note (Signed)
Name: [name] MRN: [id number] DOB: February 07, 1928    ADMISSION DATE:  07/29/2016 CONSULTATION DATE:  07/29/2016  REFERRING MD :  Dr. Konrad Dolores  CHIEF COMPLAINT:  Shortness of Breath   HISTORY OF PRESENT ILLNESS:   [name] is an 83 year old female followed by Dr Craige Cotta in our office w/ a sig h/o chronic respiratory failure in the setting centrilobar emphysema w/ most recent PFTs suggesting only mild obstructive disease w/ FEV1 of 70%, ambulatory hypoxia w/ O2 sats as low as 82% on room air and known h/o atrial flutter. Last seen in our office on 4/13 at which time she was instructed to continue advair and we were evaluating w/ her h/o glaucoma if LAMA treatment would be contraindicated. She was also advised to start using oxygen w/ exertion and at HS.  She presents to the ED on 4/24 w/ cc: increased work of breathing and ambulatory desaturation as low as 60% w/ exertion.  In there ER EKG showed 4:1 flutter, she was afebrile, nml WBC ct, BNP 218, CT chest showed CM w/ R>L effusions, CM, and mod-marked centrilobar emphysema.  She was admitted to the hospitalist service w/ working dx of acute on chronic respiratory failure w/ working dx of volume overload in setting of acute diastolic HF superimposed on top of underlying COPD. PCCM was asked to see and assist w/ her care.   PAST MEDICAL HISTORY :   has a past medical history of Anticoagulated; Atrial flutter (HCC); Bloody stools; CHF (congestive heart failure) (HCC); Chronic back pain; CKD (chronic kidney disease), stage III; COPD (chronic obstructive pulmonary disease) (HCC); Diverticulosis; Dysrhythmia; Edema; Gait disorder; Glaucoma; Glaucoma; High cholesterol; Multinodular goiter; PAOD (peripheral arterial occlusive disease) (HCC); PMR (polymyalgia rheumatica) (HCC); Pneumonia, organism unspecified(486) (04/18/2016); Protein-calorie malnutrition (HCC); Rash of back; and Weakness.  has a past surgical history that includes Gallbladder surgery;  leg stent (Right); Laminectomy; Esophagogastroduodenoscopy (N/A, 07/08/2013); Shoulder surgery; Colonoscopy (N/A, 07/15/2013); and ir generic historical (05/18/2014). Prior to Admission medications   Medication Sig Start Date End Date Taking? Authorizing Provider  acetaminophen (TYLENOL) 500 MG tablet Take 500 mg by mouth every 4 (four) hours as needed for mild pain.    Yes Historical Provider, MD  albuterol (PROAIR HFA) 108 (90 Base) MCG/ACT inhaler Inhale 2 puffs into the lungs every 6 (six) hours as needed for wheezing or shortness of breath. 07/18/16  Yes Coralyn Helling, MD  aspirin 81 MG tablet Take 81 mg by mouth daily.   Yes Historical Provider, MD  atorvastatin (LIPITOR) 10 MG tablet Take 5 mg by mouth daily.    Yes Historical Provider, MD  cholecalciferol (VITAMIN D) 1000 UNITS tablet Take 3,000 Units by mouth daily.   Yes Historical Provider, MD  diltiazem (CARDIZEM) 120 MG tablet Take 240 mg by mouth daily.    Yes Historical Provider, MD  ELIQUIS 2.5 MG TABS tablet Take 2.5 mg by mouth 2 (two) times daily. 04/11/16  Yes Historical Provider, MD  Fluticasone-Salmeterol (ADVAIR) 250-50 MCG/DOSE AEPB Inhale 1 puff into the lungs 2 (two) times daily.   Yes Historical Provider, MD  OXYGEN Inhale 2 L into the lungs daily.   Yes Historical Provider, MD  predniSONE (DELTASONE) 10 MG tablet Take 5 mg by mouth daily with breakfast.    Yes Historical Provider, MD  traMADol-acetaminophen (ULTRACET) 37.5-325 MG per tablet Take 1-2 tablets by mouth 2 (two) times daily as needed for moderate pain.  06/01/13  Yes Historical Provider, MD  vitamin E 1000 UNIT  capsule Take 2,000 Units by mouth daily.   Yes Historical Provider, MD   Allergies  Allergen Reactions  . Codeine Sulfate Nausea And Vomiting and Other (See Comments)    headache    FAMILY HISTORY:  family history includes Cirrhosis in her maternal aunt; Diabetes in her maternal uncle; Throat cancer in her maternal grandfather and mother.   SOCIAL  HISTORY:  reports that she quit smoking about 23 years ago. Her smoking use included Cigarettes. She has never used smokeless tobacco. She reports that she does not drink alcohol or use drugs.  REVIEW OF SYSTEMS:  POSITIVES IN BOLD Constitutional: Negative for fever, chills, weight loss, fatigue and diaphoresis.  HENT: Negative for hearing loss, ear pain, nosebleeds, congestion, sore throat, neck pain, tinnitus and ear discharge.   Eyes: Negative for blurred vision, double vision, photophobia, pain, discharge and redness.  Respiratory: Negative for cough, hemoptysis, no sputum production, shortness of breath, no wheezing and stridor.   Cardiovascular: Negative for chest pain, palpitations, orthopnea, claudication, leg swelling-->improved and PND.  Gastrointestinal: Negative for heartburn, nausea, vomiting, abdominal pain, diarrhea, constipation, blood in stool and melena.  Genitourinary: Negative for dysuria, urgency, frequency, hematuria and flank pain.  Musculoskeletal: Negative for myalgias, back pain, joint pain and falls.  Skin: Negative for itching and rash.  Neurological: Negative for dizziness, tingling, tremors, sensory change, speech change, focal weakness, seizures, loss of consciousness, weakness and headaches.  Endo/Heme/Allergies: Negative for environmental allergies and polydipsia. Does not bruise/bleed easily.  SUBJECTIVE:  Feels ok at rest   VITAL SIGNS: Temp:  [97.4 F (36.3 C)] 97.4 F (36.3 C) (04/24 0948) Pulse Rate:  [67-85] 76 (04/24 1400) Resp:  [14-29] 19 (04/24 1358) BP: (107-136)/(56-79) 115/74 (04/24 1400) SpO2:  [93 %-100 %] 96 % (04/24 1400) Weight:  [109 lb (49.4 kg)] 109 lb (49.4 kg) (04/24 0942)  2 liters  PHYSICAL EXAMINATION: General appearance:  83 Year old  female, well nourished  NAD, currently in acute distress,conversant  Eyes: anicteric sclerae, moist conjunctivae; PERRL, EOMI bilaterally. Mouth:  membranes and no mucosal ulcerations; normal  hard and soft palate Neck: Trachea midline; neck supple, no JVD Lungs/chest: basilar rales , with normal respiratory effort and no intercostal retractions CV: RRR, no MRGs  Abdomen: Soft, non-tender; no masses or HSM Extremities: No peripheral edema or extremity lymphadenopathy Skin: Normal temperature, turgor and texture; no rash, ulcers or subcutaneous nodules Psych: Appropriate affect, alert and oriented to person, place and time   Recent Labs Lab 07/29/16 0941  NA 140  K 3.4*  CL 105  CO2 25  BUN 18  CREATININE 1.74*  GLUCOSE 96    Recent Labs Lab 07/29/16 0941  HGB 16.2*  HCT 48.9*  WBC 9.7  PLT 291   Dg Chest 2 View  Result Date: 07/29/2016 CLINICAL DATA:  Shortness of breath today, coughing for few weeks, dry cough, history atrial fibrillation/flutter, chronic kidney disease, COPD, former smoker EXAM: CHEST  2 VIEW COMPARISON:  05/09/2016 FINDINGS: Upper normal size of cardiac silhouette. Atherosclerotic calcification aorta. Mediastinal contours and pulmonary vascularity normal. Small bibasilar pleural effusions. Underlying emphysematous and chronic bronchitic changes consistent with COPD. Questionable RIGHT basilar infiltrate. No pneumothorax. Bones demineralized. IMPRESSION: COPD changes with small BILATERAL pleural effusions and questionable RIGHT basilar infiltrate. Aortic atherosclerosis. Electronically Signed   By: Ulyses Southward M.D.   On: 07/29/2016 10:19   Ct Chest Wo Contrast  Result Date: 07/29/2016 CLINICAL DATA:  Shortness of breath since January. Status post flu/pneumonia. EXAM: CT CHEST  WITHOUT CONTRAST TECHNIQUE: Multidetector CT imaging of the chest was performed following the standard protocol without IV contrast. COMPARISON:  Plain films 07/29/2016.  No prior CT. FINDINGS: Cardiovascular: Aortic and branch vessel atherosclerosis. Aberrant right subclavian artery, traversing posterior to the esophagus. Mild cardiomegaly. Multivessel coronary artery  atherosclerosis. Mediastinum/Nodes: Borderline precarinal adenopathy at 10 mm on image 66/series 3. Hilar regions poorly evaluated without intravenous contrast. Lungs/Pleura: Small, right larger than left pleural effusions. Moderate to marked centrilobular emphysema. Mild biapical pleural-parenchymal scarring. Dependent right base opacity is most consistent with atelectasis and scar. Upper Abdomen: Cholecystectomy. Normal imaged portions of the spleen, stomach, pancreas, adrenal glands. Musculoskeletal: Thoracolumbar spondylosis with S-shaped thoracic spine curvature. IMPRESSION: 1. Moderate-to-marked emphysema. 2. Cardiomegaly with small bilateral pleural effusions. Question fluid overload or mild CHF. 3.  Coronary artery atherosclerosis. Aortic atherosclerosis. 4. Borderline thoracic adenopathy, favored to be reactive. Electronically Signed   By: Jeronimo Greaves M.D.   On: 07/29/2016 12:12   SIGNIFICANT EVENTS  4/24  Admit   STUDIES:  EKG 4/24 >> Aflutter 4:1   Impression/plan  Acute on Chronic hypoxic respiratory failure Acute diastolic HF  Pulmonary edema COPD (moderate obstruction) Cor pulmonale Volume overload  Discussion Acute on Chronic Hypoxic respiratory failure: Suspect primarily due to volume overload due to acute decompensated diastolic HF and cor pulmonale in setting of underlying COPD.  There is no evidence of bronchospasm.   Plan Repeat Echo Lasix IV Cont home BDs, no role for pulse steroids.  Likely needs oxygen 24/7 Will need walking oximetry to determine Oxygen needs.   Simonne Martinet ACNP-BC [facility] Pulmonary/Critical Care Pager # [phone] OR # [phone] if no answer  07/29/2016, 2:22 PM  Attending Note:  83 year old female with PMH of COPD and CHF who presents to the hospital with SOB.  Patient has been requiring O2 with exertion but patient has been refusing use of O2.  Evidently she is also on Advair and albuterol.  On exam, patient's lungs are clear with no  wheezes.  I reviewed chest CT myself, small right sided pleural effusion noted with centrilobular emphysema.  Discussed with TRH-MD and PCCM-NP.  Hypoxemia: imagine multi-factorial with COPD and pulmonary edema.             - Titrate O2 for sat of 88-92%.             - Called and will get home portable concentrator.             - Needs home O2 24/7.  Acute pulmonary edema:             - Lasix as ordered             - BMET in AM             - Repeat echo.  COPD: not an acute exacerbation             -  No need for steroids             - Advair             - PRN albuterol  CHF:             - Repeat echo.             - Continue dilt for rate control             - Continue eliquis.  Deconditioning             -  PT evaluation             - OOB to chair as tolerated  Patient seen and examined, agree with above note.  I dictated the care and orders written for this patient under my direction.  Alyson Reedy, MD [phone]

## 2016-07-30 ENCOUNTER — Observation Stay (HOSPITAL_BASED_OUTPATIENT_CLINIC_OR_DEPARTMENT_OTHER): Payer: Medicare Other

## 2016-07-30 DIAGNOSIS — N183 Chronic kidney disease, stage 3 (moderate): Secondary | ICD-10-CM | POA: Diagnosis present

## 2016-07-30 DIAGNOSIS — R0600 Dyspnea, unspecified: Secondary | ICD-10-CM

## 2016-07-30 DIAGNOSIS — Z7901 Long term (current) use of anticoagulants: Secondary | ICD-10-CM | POA: Diagnosis not present

## 2016-07-30 DIAGNOSIS — I2510 Atherosclerotic heart disease of native coronary artery without angina pectoris: Secondary | ICD-10-CM | POA: Diagnosis present

## 2016-07-30 DIAGNOSIS — I441 Atrioventricular block, second degree: Secondary | ICD-10-CM | POA: Diagnosis present

## 2016-07-30 DIAGNOSIS — R0609 Other forms of dyspnea: Secondary | ICD-10-CM

## 2016-07-30 DIAGNOSIS — J9601 Acute respiratory failure with hypoxia: Secondary | ICD-10-CM | POA: Diagnosis not present

## 2016-07-30 DIAGNOSIS — I509 Heart failure, unspecified: Secondary | ICD-10-CM | POA: Diagnosis not present

## 2016-07-30 DIAGNOSIS — G8929 Other chronic pain: Secondary | ICD-10-CM | POA: Diagnosis present

## 2016-07-30 DIAGNOSIS — Z9981 Dependence on supplemental oxygen: Secondary | ICD-10-CM | POA: Diagnosis not present

## 2016-07-30 DIAGNOSIS — I5043 Acute on chronic combined systolic (congestive) and diastolic (congestive) heart failure: Secondary | ICD-10-CM | POA: Diagnosis present

## 2016-07-30 DIAGNOSIS — I4892 Unspecified atrial flutter: Secondary | ICD-10-CM

## 2016-07-30 DIAGNOSIS — J9621 Acute and chronic respiratory failure with hypoxia: Secondary | ICD-10-CM | POA: Diagnosis present

## 2016-07-30 DIAGNOSIS — N184 Chronic kidney disease, stage 4 (severe): Secondary | ICD-10-CM | POA: Diagnosis not present

## 2016-07-30 DIAGNOSIS — Z7952 Long term (current) use of systemic steroids: Secondary | ICD-10-CM | POA: Diagnosis not present

## 2016-07-30 DIAGNOSIS — Z87891 Personal history of nicotine dependence: Secondary | ICD-10-CM | POA: Diagnosis not present

## 2016-07-30 DIAGNOSIS — J441 Chronic obstructive pulmonary disease with (acute) exacerbation: Secondary | ICD-10-CM | POA: Diagnosis present

## 2016-07-30 DIAGNOSIS — T502X5A Adverse effect of carbonic-anhydrase inhibitors, benzothiadiazides and other diuretics, initial encounter: Secondary | ICD-10-CM | POA: Diagnosis not present

## 2016-07-30 DIAGNOSIS — Z885 Allergy status to narcotic agent status: Secondary | ICD-10-CM | POA: Diagnosis not present

## 2016-07-30 DIAGNOSIS — I480 Paroxysmal atrial fibrillation: Secondary | ICD-10-CM

## 2016-07-30 DIAGNOSIS — M791 Myalgia: Secondary | ICD-10-CM | POA: Diagnosis present

## 2016-07-30 DIAGNOSIS — Z7982 Long term (current) use of aspirin: Secondary | ICD-10-CM | POA: Diagnosis not present

## 2016-07-30 DIAGNOSIS — Z66 Do not resuscitate: Secondary | ICD-10-CM | POA: Diagnosis present

## 2016-07-30 DIAGNOSIS — I5031 Acute diastolic (congestive) heart failure: Secondary | ICD-10-CM | POA: Diagnosis not present

## 2016-07-30 DIAGNOSIS — I2781 Cor pulmonale (chronic): Secondary | ICD-10-CM | POA: Diagnosis present

## 2016-07-30 DIAGNOSIS — Z79899 Other long term (current) drug therapy: Secondary | ICD-10-CM | POA: Diagnosis not present

## 2016-07-30 DIAGNOSIS — Z9119 Patient's noncompliance with other medical treatment and regimen: Secondary | ICD-10-CM | POA: Diagnosis not present

## 2016-07-30 DIAGNOSIS — E7800 Pure hypercholesterolemia, unspecified: Secondary | ICD-10-CM | POA: Diagnosis present

## 2016-07-30 DIAGNOSIS — E440 Moderate protein-calorie malnutrition: Secondary | ICD-10-CM | POA: Diagnosis present

## 2016-07-30 DIAGNOSIS — Z7951 Long term (current) use of inhaled steroids: Secondary | ICD-10-CM | POA: Diagnosis not present

## 2016-07-30 DIAGNOSIS — R0602 Shortness of breath: Secondary | ICD-10-CM | POA: Diagnosis present

## 2016-07-30 DIAGNOSIS — J449 Chronic obstructive pulmonary disease, unspecified: Secondary | ICD-10-CM | POA: Diagnosis not present

## 2016-07-30 LAB — ECHOCARDIOGRAM COMPLETE
E decel time: 187 msec
FS: 30 % (ref 28–44)
HEIGHTINCHES: 63 in
IV/PV OW: 1.38
LA diam end sys: 27 mm
LA vol index: 12.7 mL/m2
LADIAMINDEX: 1.85 cm/m2
LASIZE: 27 mm
LAVOL: 18.6 mL
LAVOLA4C: 15.7 mL
LVOT VTI: 14.9 cm
LVOT area: 1.77 cm2
LVOT diameter: 15 mm
LVOT peak grad rest: 3 mmHg
LVOT peak vel: 86.2 cm/s
LVOTSV: 26 mL
MV Dec: 187
MV pk E vel: 80.1 m/s
MVPG: 3 mmHg
MVPKAVEL: 27.4 m/s
PW: 8 mm — AB (ref 0.6–1.1)
RV sys press: 32 mmHg
Reg peak vel: 268 cm/s
TAPSE: 11.8 mm
TRMAXVEL: 268 cm/s
WEIGHTICAEL: 1659.2 [oz_av]

## 2016-07-30 LAB — BASIC METABOLIC PANEL
Anion gap: 12 (ref 5–15)
BUN: 21 mg/dL — ABNORMAL HIGH (ref 6–20)
CHLORIDE: 100 mmol/L — AB (ref 101–111)
CO2: 26 mmol/L (ref 22–32)
Calcium: 8.7 mg/dL — ABNORMAL LOW (ref 8.9–10.3)
Creatinine, Ser: 1.79 mg/dL — ABNORMAL HIGH (ref 0.44–1.00)
GFR, EST AFRICAN AMERICAN: 28 mL/min — AB (ref >60)
GFR, EST NON AFRICAN AMERICAN: 24 mL/min — AB (ref >60)
Glucose, Bld: 97 mg/dL (ref 65–99)
POTASSIUM: 3.3 mmol/L — AB (ref 3.5–5.1)
SODIUM: 138 mmol/L (ref 135–145)

## 2016-07-30 LAB — CBC
HCT: 46.1 % — ABNORMAL HIGH (ref 36.0–46.0)
HEMOGLOBIN: 15.2 g/dL — AB (ref 12.0–15.0)
MCH: 30.7 pg (ref 26.0–34.0)
MCHC: 33 g/dL (ref 30.0–36.0)
MCV: 93.1 fL (ref 78.0–100.0)
PLATELETS: 312 10*3/uL (ref 150–400)
RBC: 4.95 MIL/uL (ref 3.87–5.11)
RDW: 14.6 % (ref 11.5–15.5)
WBC: 8.4 10*3/uL (ref 4.0–10.5)

## 2016-07-30 MED ORDER — FUROSEMIDE 40 MG PO TABS: 40.0000 mg | ORAL_TABLET | Freq: Every day | ORAL | Status: DC

## 2016-07-30 MED ORDER — FUROSEMIDE 10 MG/ML IJ SOLN: 40.0000 mg | Freq: Every day | INTRAMUSCULAR | Status: DC

## 2016-07-30 MED ORDER — POTASSIUM CHLORIDE CRYS ER 20 MEQ PO TBCR
40.0000 meq | EXTENDED_RELEASE_TABLET | Freq: Once | ORAL | Status: AC
  Administered 2016-07-30: 40 meq via ORAL
  Filled 2016-07-30: qty 2

## 2016-07-30 MED ORDER — FUROSEMIDE 20 MG PO TABS
20.0000 mg | ORAL_TABLET | Freq: Every day | ORAL | Status: DC
  Administered 2016-08-01: 20 mg via ORAL
  Filled 2016-07-30 (×2): qty 1

## 2016-07-30 NOTE — Discharge Instructions (Signed)

## 2016-07-30 NOTE — Plan of Care (Signed)
Problem: Skin Integrity: Goal: Risk for impaired skin integrity will decrease Outcome: Completed/Met Date Met: 07/30/16 No skin breakdown  Problem: Tissue Perfusion: Goal: Risk factors for ineffective tissue perfusion will decrease Outcome: Completed/Met Date Met: 07/30/16 Skin, Clean warm, and Dry with Capillary Refill 3 secs or less  Problem: Activity: Goal: Risk for activity intolerance will decrease Outcome: Progressing Balance Activity and rest

## 2016-07-30 NOTE — Progress Notes (Signed)
Informed Dr. Benjamine Mola that patients BP is 86/52 manually with patient denying any symptoms. Per Dr. Benjamine Mola hold Cardizem and see if pressure comes up. If it does give Cardizem.

## 2016-07-30 NOTE — Progress Notes (Signed)
Initial Nutrition Assessment  DOCUMENTATION CODES:   Non-severe (moderate) malnutrition in context of chronic illness, Underweight  INTERVENTION:  Continue Ensure Enlive po BID, each supplement provides 350 kcal and 20 grams of protein  Encourage adequate PO intake.   NUTRITION DIAGNOSIS:   Malnutrition related to chronic illness as evidenced by moderate depletion of body fat, severe depletion of muscle mass.  GOAL:   Patient will meet greater than or equal to 90% of their needs  MONITOR:   PO intake, Supplement acceptance, Labs, Weight trends, Skin, I & O's  REASON FOR ASSESSMENT:   Malnutrition Screening Tool    ASSESSMENT:   83 y.o. female with medical history significant of atrial flutter on pelvic rest, congestive heart failure, C KD, glaucoma, diverticulosis, hyperlipidemia, PVD, protein calorie malnutrition.  Acute on chronic respiratory failure: secondary to acute on chronic diastolic CHF w/ underlying advanced COPD w/o exacerbation.  Meal completion has been 85-100%. Pt reports having a good appetite currently and PTA with usual consumption of at least 3 meals a day with Ensure shakes at least twice daily. Usual body weight reported to be ~107-110 lbs. Noted pt is currently diuresing as pt volume overloaded. Weight loss since admission likely related to fluid status. Pt currently has Ensure ordered. RD to continue with current orders.   Nutrition-Focused physical exam completed. Findings are moderate fat depletion, moderate to severe muscle depletion, and no edema.   Labs and medications reviewed.   Diet Order:  Diet regular Room service appropriate? Yes; Fluid consistency: Thin  Skin:  Reviewed, no issues  Last BM:  4/24  Height:   Ht Readings from Last 1 Encounters:  07/29/16  (1.6 m)    Weight:   Wt Readings from Last 1 Encounters:  07/30/16 103 lb 11.2 oz (47 kg)    Ideal Body Weight:  52.27 kg  BMI:  Body mass index is 18.37  kg/m.  Estimated Nutritional Needs:   Kcal:  1450-1650  Protein:  65-75 grams  Fluid:  Per MD  EDUCATION NEEDS:   No education needs identified at this time  Roslyn Smiling, MS, RD, LDN Pager # [phone] After hours/ weekend pager # [phone]

## 2016-07-30 NOTE — Consult Note (Signed)
CARDIOLOGY CONSULT NOTE     Patient ID: [name] MRN: [id number] DOB/AGE: June 16, 1927 83 y.o.  Admit date: 07/29/2016 Referring Physician Marlin Canary MD Primary Physician GREEN, Lorenda Ishihara, MD Primary Cardiologist new Reason for Consultation Atrial flutter/CHF  HPI: Pleasant 83 yo WF seen at the request of Dr. Benjamine Mola for evaluation of atrial flutter and CHF. She has a long history of paroxysmal atrial fibrillation/flutter managed with rate control with Cardizem. She reports she was placed on Eliquis one year ago. Did take Xarelto in the past but had some increased bleeding on this. Has tolerated Eliquis well without bleeding. She has a history of CKD, PAD (50% carotid disease) and diastolic CHF. She also has COPD. She was admitted to [facility] in January with PNA and the flu. Was initially in NSR but went into AFib in hospital. Rate controlled. She was discharged to SNF for one month. Required diuretics for edema but was discharged home on no diuretic. She presented yesterday with symptoms of increased Dyspnea and weakness. Feels much better today. Denies dizziness, cough, chest pain. Is generally not aware of her arrhythmia. No known history of CAD. Echo in January showed normal LV function.  Past Medical History:  Diagnosis Date  . Anticoagulated    Eliquis  . Atrial flutter (HCC)    NO CARDIOLOGIST FOLLOWED BY DR Tori Milks  . Bloody stools    LAST 4 DAYS  . CHF (congestive heart failure) (HCC)   . Chronic back pain   . CKD (chronic kidney disease), stage III    Creatinine 1.35 -1.55  . COPD (chronic obstructive pulmonary disease) (HCC)   . Diverticulosis   . Dyspnea   . Dysrhythmia   . Edema   . Gait disorder   . Glaucoma    Severe  . Glaucoma    both eyes  . High cholesterol   . Multinodular goiter   . PAOD (peripheral arterial occlusive disease) (HCC)    Stein in right groin, decrease pulses  . PMR (polymyalgia rheumatica) (HCC)   . Pneumonia, organism unspecified(486)  04/18/2016  . Protein-calorie malnutrition (HCC)   . Rash of back   . Weakness     Family History  Problem Relation Age of Onset  . Throat cancer Mother   . Diabetes Maternal Uncle   . Throat cancer Maternal Grandfather   . Cirrhosis Maternal Aunt     Social History   Social History  . Marital status: Married    Spouse name: N/A  . Number of children: 4  . Years of education: N/A   Occupational History  . Retired    Social History Main Topics  . Smoking status: Former Smoker    Types: Cigarettes    Quit date: 07/12/1993  . Smokeless tobacco: Never Used  . Alcohol use No  . Drug use: No  . Sexual activity: Not on file   Other Topics Concern  . Not on file   Social History Narrative   Married -Dr. Aura Fey   Former smoker - stopped 1995   Alcohol none   POA, Living Will    Past Surgical History:  Procedure Laterality Date  . COLONOSCOPY N/A 07/15/2013   Procedure: COLONOSCOPY;  Surgeon: Louis Meckel, MD;  Location: [facility] ENDOSCOPY;  Service: Endoscopy;  Laterality: N/A;  . ESOPHAGOGASTRODUODENOSCOPY N/A 07/08/2013   Procedure: ESOPHAGOGASTRODUODENOSCOPY (EGD);  Surgeon: Louis Meckel, MD;  Location: [facility];  Service: Endoscopy;  Laterality: N/A;  Per Connye Burkitt, will try to  have CRNA available, but will not guaranteee. Ok to schedule per Clydie Braun 07/06/13  . GALLBLADDER SURGERY    . IR GENERIC HISTORICAL  05/18/2014   IR RADIOLOGIST EVAL & MGMT 05/18/2014 Berdine Dance, MD [facility] INTERV RAD  . LAMINECTOMY    . leg stent Right   . SHOULDER SURGERY     frozen shoulder/ Bil shoulders     Prescriptions Prior to Admission  Medication Sig Dispense Refill Last Dose  . acetaminophen (TYLENOL) 500 MG tablet Take 500 mg by mouth every 4 (four) hours as needed for mild pain.    PRN  . albuterol (PROAIR HFA) 108 (90 Base) MCG/ACT inhaler Inhale 2 puffs into the lungs every 6 (six) hours as needed for wheezing or shortness of breath. 1 Inhaler 3 PRN  . aspirin 81  MG tablet Take 81 mg by mouth daily.   07/28/2016 at Unknown time  . atorvastatin (LIPITOR) 10 MG tablet Take 5 mg by mouth daily.    07/28/2016 at Unknown time  . cholecalciferol (VITAMIN D) 1000 UNITS tablet Take 3,000 Units by mouth daily.   07/28/2016 at Unknown time  . diltiazem (CARDIZEM) 120 MG tablet Take 240 mg by mouth daily.    07/28/2016 at Unknown time  . ELIQUIS 2.5 MG TABS tablet Take 2.5 mg by mouth 2 (two) times daily.   07/28/2016 at 800 pm  . Fluticasone-Salmeterol (ADVAIR) 250-50 MCG/DOSE AEPB Inhale 1 puff into the lungs 2 (two) times daily.   07/29/2016 at Unknown time  . OXYGEN Inhale 2 L into the lungs daily.   07/29/2016 at Unknown time  . predniSONE (DELTASONE) 10 MG tablet Take 5 mg by mouth daily with breakfast.    07/28/2016 at Unknown time  . traMADol-acetaminophen (ULTRACET) 37.5-325 MG per tablet Take 1-2 tablets by mouth 2 (two) times daily as needed for moderate pain.    PRN  . vitamin E 1000 UNIT capsule Take 2,000 Units by mouth daily.   07/28/2016 at Unknown time     ROS: As noted in HPI. All other systems are reviewed and are negative unless otherwise mentioned.   Physical Exam: Blood pressure (!) 87/61, pulse 90, temperature 98 F (36.7 C), temperature source Oral, resp. rate 18, height  (1.6 m), weight 103 lb 11.2 oz (47 kg), SpO2 97 %. Current Weight  07/30/16 103 lb 11.2 oz (47 kg)  07/18/16 109 lb (49.4 kg)  05/16/16 107 lb (48.5 kg)    GENERAL:  Well appearing, thin, small framed female in NAD HEENT:  PERRL, EOMI, sclera are clear. Oropharynx is clear. NECK:  No jugular venous distention, carotid upstroke brisk and symmetric, soft right carotid bruit, no thyromegaly or adenopathy LUNGS:  Clear to auscultation bilaterally, diminished BS in bases.  CHEST:  Unremarkable HEART:  IRRR,  PMI not displaced or sustained,S1 and S2 within normal limits, no S3, no S4: no clicks, no rubs, no murmurs ABD:  Soft, nontender. BS +, no masses or bruits. No  hepatomegaly, no splenomegaly EXT:  2 + pulses throughout, no edema, no cyanosis no clubbing SKIN:  Warm and dry.  No rashes NEURO:  Alert and oriented x 3. Cranial nerves II through XII intact. PSYCH:  Cognitively intact    Labs:   Lab Results  Component Value Date   WBC 8.4 07/30/2016   HGB 15.2 (H) 07/30/2016   HCT 46.1 (H) 07/30/2016   MCV 93.1 07/30/2016   PLT 312 07/30/2016    Recent Labs Lab 07/30/16 0557  NA 138  K 3.3*  CL 100*  CO2 26  BUN 21*  CREATININE 1.79*  CALCIUM 8.7*  GLUCOSE 97   Lab Results  Component Value Date   TROPONINI <0.03 07/29/2016   No results found for: CHOL No results found for: HDL No results found for: LDLCALC No results found for: TRIG No results found for: CHOLHDL No results found for: LDLDIRECT  No results found for: PROBNP Lab Results  Component Value Date   TSH 3.58 05/15/2016   No results found for: HGBA1C  Radiology: Dg Chest 2 View  Result Date: 07/29/2016 CLINICAL DATA:  Shortness of breath today, coughing for few weeks, dry cough, history atrial fibrillation/flutter, chronic kidney disease, COPD, former smoker EXAM: CHEST  2 VIEW COMPARISON:  05/09/2016 FINDINGS: Upper normal size of cardiac silhouette. Atherosclerotic calcification aorta. Mediastinal contours and pulmonary vascularity normal. Small bibasilar pleural effusions. Underlying emphysematous and chronic bronchitic changes consistent with COPD. Questionable RIGHT basilar infiltrate. No pneumothorax. Bones demineralized. IMPRESSION: COPD changes with small BILATERAL pleural effusions and questionable RIGHT basilar infiltrate. Aortic atherosclerosis. Electronically Signed   By: Ulyses Southward M.D.   On: 07/29/2016 10:19   Ct Chest Wo Contrast  Result Date: 07/29/2016 CLINICAL DATA:  Shortness of breath since January. Status post flu/pneumonia. EXAM: CT CHEST WITHOUT CONTRAST TECHNIQUE: Multidetector CT imaging of the chest was performed following the standard  protocol without IV contrast. COMPARISON:  Plain films 07/29/2016.  No prior CT. FINDINGS: Cardiovascular: Aortic and branch vessel atherosclerosis. Aberrant right subclavian artery, traversing posterior to the esophagus. Mild cardiomegaly. Multivessel coronary artery atherosclerosis. Mediastinum/Nodes: Borderline precarinal adenopathy at 10 mm on image 66/series 3. Hilar regions poorly evaluated without intravenous contrast. Lungs/Pleura: Small, right larger than left pleural effusions. Moderate to marked centrilobular emphysema. Mild biapical pleural-parenchymal scarring. Dependent right base opacity is most consistent with atelectasis and scar. Upper Abdomen: Cholecystectomy. Normal imaged portions of the spleen, stomach, pancreas, adrenal glands. Musculoskeletal: Thoracolumbar spondylosis with S-shaped thoracic spine curvature. IMPRESSION: 1. Moderate-to-marked emphysema. 2. Cardiomegaly with small bilateral pleural effusions. Question fluid overload or mild CHF. 3.  Coronary artery atherosclerosis. Aortic atherosclerosis. 4. Borderline thoracic adenopathy, favored to be reactive. Electronically Signed   By: Jeronimo Greaves M.D.   On: 07/29/2016 12:12    EKG: atrial flutter, rate 130. No other acute change. I have personally reviewed and interpreted this study.  Telemetry reviewed. afib/flutter with generally good rate control.  ASSESSMENT AND PLAN:  1. Paroxysmal atrial fibrillation/flutter. Rate is generally well controlled on Cardizem. She has had this for years. Not a good candidate for AAD therapy due to CKD and COPD. Recommend continued rate control. She has a [place] Vasc score of 5 (age,sex, CHF, PAD). She is on low dose Eliquis which is appropriate for her age, body weight and renal dysfunction. My concern is that she is approaching the point where it may not be safe to continue Eliquis with worsening renal function. She was stage 3 and now stage 4. This may be exacerbated by recent diuretics. I think  we will need to watch renal function closely and see at what point it stabilizes. At the very least we should stop her ASA to minimize her bleeding risk  2. Acute on chronic diastolic CHF. Good response to IV lasix with 6 lbs weight loss in last 2 days. Clinically much better. Now BP soft so I would lower lasix to 20 mg daily to avoid hypotension and worsening kidney disease. May need to adjust and take extra as  needed for weight gain or increased edema. Awaiting repeat Echo today.  3. CKD acute on chronic stage 3>4.   4. COPD - plan on home oxygen therapy.  I will be happy to follow as outpatient.   Signed: Peter [place], MDFACC  07/30/2016, 4:27 PM

## 2016-07-30 NOTE — Progress Notes (Signed)
Informed Dr Benjamine Mola that's patients BP is back in the 80's over 60's. Dr. Benjamine Mola states she is going to place a cardiology consult.

## 2016-07-30 NOTE — Progress Notes (Addendum)
PROGRESS NOTE    [name]  ZHY:[id number] DOB: 1928/01/13 DOA: 07/29/2016 PCP: Enrique Sack, MD   Outpatient Specialists:     Brief Narrative:  [name] is a 83 y.o. female with medical history significant of atrial flutter on pelvic rest, congestive heart failure, C KD, glaucoma, diverticulosis, hyperlipidemia, PVD, protein calorie malnutrition.    Patient presented to [facility] emergency room via EMS after family became very concerned over her respiratory status. Patient uses 2 L of O2 by nasal cannula which has not changed. Home O2 saturations remained above 90% on this regimen though she has had marked increased respiratory effort during this time. Symptoms are worsened with exertion and improved with rest. When off of her home oxygen her saturations dropped into the mid 60s at times. Patient endorsing an occasional dry cough but denies fevers, chest pain, palpitations, abdominal pain, dysuria, frequency, flank pain, neck stiffness, headache, focal neurological deficit. Symptoms have been ongoing for the last 2 weeks and were associated with lower extremity edema which family states was large as 4+. Patient with a general physical and respiratory decline ever since contracting pneumonia in January 2018. On 07/25/2016 patient was started on Lasix 20 mg daily. In a short amount of time this resolved patient's lower extremity edema. Family denies any known episodes of RVR that may be related to her feelings of shortness of breath and fatigue and weakness. Of note patient was started on oxygen a couple of weeks ago by her pulmonologist, Dr. Craige Cotta.     Assessment & Plan:   Active Problems:   CKD (chronic kidney disease), stage III   High cholesterol   COPD (chronic obstructive pulmonary disease) (HCC)   Acute respiratory distress   CAD (coronary artery disease)   Chronic musculoskeletal pain   COPD without exacerbation (HCC)   Acute diastolic (congestive) heart failure  (HCC)   Acute on chronic respiratory failure: secondary to acute on chronic diastolic CHF w/ underlying advanced COPD w/o exacerbation.  -baseline 2L [place] w/ nml O2 sats.  -Respiratory status has gradually worsened since contracting pneumonia in January 2018. Started lasix  Qday 4 days ago w/o improvement in respiratory status. -Echo from 05/2016 w/ EF 55% but unable to eval diastolic function, but increased PA pressure noted at 33. Echo prior to onset of Afib noting diastolic dysfunction.  - given lasix but unable to tolerate due to low BP-- vs need for stress dose steroids-- defer to pulm - Strict I/O, Daily wts (down 1.2L) - Pulm consult per pt request - Continue Prednisone, O2, albuterol, Advair - Procalcitonin negative  Aflutter: Rate controlled - Continue Eliquis, Dilt -? If needs cardiology intervention as perhaps patient is not tolerating flutter (seen by cardiology 1 time in January)- re-consult-- has never recovered since that hospitalization-- but also has severe lung disease  CKD: Cr 1.78. unclear baseline but likely similar. - daily BMP  CAD:  - continue statin and ASA  MSK pain: at baseline  - continue tramadol   DVT prophylaxis:  Fully anticoagulated   Code Status: DNR   Family Communication:   Disposition Plan:     Consultants:   pulm   Subjective: Had episode of rapid HR-- appears to be flutter-- increased SOB during this time-- resolved with PO cardizem  Objective: Vitals:   07/30/16 0833 07/30/16 1000 07/30/16 1100 07/30/16 1147  BP:  (!) 86/52 109/83 (!) 86/66  Pulse:  100 (!) 131 90  Resp:    18  Temp:  97.9 F (36.6 C)  98 F (36.7 C)  TempSrc:  Oral  Oral  SpO2: 95% 100%  97%  Weight:      Height:        Intake/Output Summary (Last 24 hours) at 07/30/16 1219 Last data filed at 07/30/16 1100  Gross per 24 hour  Intake              580 ml  Output             1850 ml  Net            -1270 ml   Filed Weights   07/29/16  0942 07/29/16 1438 07/30/16 0611  Weight: 49.4 kg (109 lb) 48 kg (105 lb 14.4 oz) 47 kg (103 lb 11.2 oz)    Examination:  General exam: Appears calm and comfortable  Respiratory system: Clear to auscultation. Respiratory effort normal. Cardiovascular system: S1 & S2 heard, RRR. No JVD, murmurs, rubs, gallops or clicks. No pedal edema. Gastrointestinal system: Abdomen is nondistended, soft and nontender. No organomegaly or masses felt. Normal bowel sounds heard. Central nervous system: Alert and oriented. No focal neurological deficits. Extremities: Symmetric 5 x 5 power. Skin: No rashes, lesions or ulcers Psychiatry: Judgement and insight appear normal. Mood & affect appropriate.     Data Reviewed: I have personally reviewed following labs and imaging studies  CBC:  Recent Labs Lab 07/29/16 0941 07/30/16 0557  WBC 9.7 8.4  NEUTROABS 6.5  --   HGB 16.2* 15.2*  HCT 48.9* 46.1*  MCV 93.9 93.1  PLT 291 312   Basic Metabolic Panel:  Recent Labs Lab 07/29/16 0941 07/30/16 0557  NA 140 138  K 3.4* 3.3*  CL 105 100*  CO2 25 26  GLUCOSE 96 97  BUN 18 21*  CREATININE 1.74* 1.79*  CALCIUM 9.2 8.7*   GFR: Estimated Creatinine Clearance: 16.4 mL/min (A) (by C-G formula based on SCr of 1.79 mg/dL (H)). Liver Function Tests: No results for input(s): AST, ALT, ALKPHOS, BILITOT, PROT, ALBUMIN in the last 168 hours. No results for input(s): LIPASE, AMYLASE in the last 168 hours. No results for input(s): AMMONIA in the last 168 hours. Coagulation Profile: No results for input(s): INR, PROTIME in the last 168 hours. Cardiac Enzymes:  Recent Labs Lab 07/29/16 0941  TROPONINI <0.03   BNP (last 3 results) No results for input(s): PROBNP in the last 8760 hours. HbA1C: No results for input(s): HGBA1C in the last 72 hours. CBG: No results for input(s): GLUCAP in the last 168 hours. Lipid Profile: No results for input(s): CHOL, HDL, LDLCALC, TRIG, CHOLHDL, LDLDIRECT in the  last 72 hours. Thyroid Function Tests: No results for input(s): TSH, T4TOTAL, FREET4, T3FREE, THYROIDAB in the last 72 hours. Anemia Panel: No results for input(s): VITAMINB12, FOLATE, FERRITIN, TIBC, IRON, RETICCTPCT in the last 72 hours. Urine analysis:    Component Value Date/Time   COLORURINE STRAW (A) 04/14/2016 1345   APPEARANCEUR CLEAR 04/14/2016 1345   LABSPEC 1.003 (L) 04/14/2016 1345   PHURINE 6.0 04/14/2016 1345   GLUCOSEU NEGATIVE 04/14/2016 1345   HGBUR NEGATIVE 04/14/2016 1345   BILIRUBINUR NEGATIVE 04/14/2016 1345   KETONESUR NEGATIVE 04/14/2016 1345   PROTEINUR NEGATIVE 04/14/2016 1345   NITRITE NEGATIVE 04/14/2016 1345   LEUKOCYTESUR NEGATIVE 04/14/2016 1345     )No results found for this or any previous visit (from the past 240 hour(s)).    Anti-infectives    None       Radiology Studies: Dg Chest  2 View  Result Date: 07/29/2016 CLINICAL DATA:  Shortness of breath today, coughing for few weeks, dry cough, history atrial fibrillation/flutter, chronic kidney disease, COPD, former smoker EXAM: CHEST  2 VIEW COMPARISON:  05/09/2016 FINDINGS: Upper normal size of cardiac silhouette. Atherosclerotic calcification aorta. Mediastinal contours and pulmonary vascularity normal. Small bibasilar pleural effusions. Underlying emphysematous and chronic bronchitic changes consistent with COPD. Questionable RIGHT basilar infiltrate. No pneumothorax. Bones demineralized. IMPRESSION: COPD changes with small BILATERAL pleural effusions and questionable RIGHT basilar infiltrate. Aortic atherosclerosis. Electronically Signed   By: Ulyses Southward M.D.   On: 07/29/2016 10:19   Ct Chest Wo Contrast  Result Date: 07/29/2016 CLINICAL DATA:  Shortness of breath since January. Status post flu/pneumonia. EXAM: CT CHEST WITHOUT CONTRAST TECHNIQUE: Multidetector CT imaging of the chest was performed following the standard protocol without IV contrast. COMPARISON:  Plain films 07/29/2016.  No  prior CT. FINDINGS: Cardiovascular: Aortic and branch vessel atherosclerosis. Aberrant right subclavian artery, traversing posterior to the esophagus. Mild cardiomegaly. Multivessel coronary artery atherosclerosis. Mediastinum/Nodes: Borderline precarinal adenopathy at 10 mm on image 66/series 3. Hilar regions poorly evaluated without intravenous contrast. Lungs/Pleura: Small, right larger than left pleural effusions. Moderate to marked centrilobular emphysema. Mild biapical pleural-parenchymal scarring. Dependent right base opacity is most consistent with atelectasis and scar. Upper Abdomen: Cholecystectomy. Normal imaged portions of the spleen, stomach, pancreas, adrenal glands. Musculoskeletal: Thoracolumbar spondylosis with S-shaped thoracic spine curvature. IMPRESSION: 1. Moderate-to-marked emphysema. 2. Cardiomegaly with small bilateral pleural effusions. Question fluid overload or mild CHF. 3.  Coronary artery atherosclerosis. Aortic atherosclerosis. 4. Borderline thoracic adenopathy, favored to be reactive. Electronically Signed   By: Jeronimo Greaves M.D.   On: 07/29/2016 12:12        Scheduled Meds: . apixaban  2.5 mg Oral BID  . aspirin EC  81 mg Oral Daily  . atorvastatin  5 mg Oral q1800  . diltiazem  240 mg Oral Daily  . feeding supplement (ENSURE ENLIVE)  237 mL Oral BID BM  . [START ON 07/31/2016] furosemide  40 mg Oral Daily  . mometasone-formoterol  2 puff Inhalation BID  . predniSONE  5 mg Oral Q breakfast  . sodium chloride flush  3 mL Intravenous Q12H  . umeclidinium bromide  1 puff Inhalation Daily   Continuous Infusions: . sodium chloride       LOS: 0 days    Time spent: 35 min    Ambrielle Kington U Anikka Marsan, DO [facility] Pager [phone]  If 7PM-7AM, please contact night-coverage [email or web address] Password [facility] 07/30/2016, 12:19 PM

## 2016-07-30 NOTE — Progress Notes (Signed)
Pt is having a restful night with no distress. On baseline 02 2 L. Plan to diurese and have a ECHO tomorrow.

## 2016-07-30 NOTE — Progress Notes (Signed)
Name: YLONDA STORR MRN: [id number] DOB: 03-23-1928    ADMISSION DATE:  07/29/2016 CONSULTATION DATE:  07/29/2016  REFERRING MD :  Dr. Konrad Dolores  CHIEF COMPLAINT:  Shortness of Breath   HISTORY OF PRESENT ILLNESS:   Mrs. Warwick is an 83 year old female followed by Dr Craige Cotta in our office w/ a sig h/o chronic respiratory failure in the setting centrilobar emphysema w/ most recent PFTs suggesting only mild obstructive disease w/ FEV1 of 70%, ambulatory hypoxia w/ O2 sats as low as 82% on room air and known h/o atrial flutter. Last seen in our office on 4/13 at which time she was instructed to continue advair and we were evaluating w/ her h/o glaucoma if LAMA treatment would be contraindicated. She was also advised to start using oxygen w/ exertion and at HS.  She presents to the ED on 4/24 w/ cc: increased work of breathing and ambulatory desaturation as low as 60% w/ exertion.  In there ER EKG showed 4:1 flutter, she was afebrile, nml WBC ct, BNP 218, CT chest showed CM w/ R>L effusions, CM, and mod-marked centrilobar emphysema.  She was admitted to the hospitalist service w/ working dx of acute on chronic respiratory failure w/ working dx of volume overload in setting of acute diastolic HF superimposed on top of underlying COPD. PCCM was asked to see and assist w/ her care.   SUBJECTIVE:  Feels better Weight down 2 lbs!  VITAL SIGNS: Temp:  [97.8 F (36.6 C)-98.8 F (37.1 C)] 98 F (36.7 C) (04/25 4098) Pulse Rate:  [67-100] 100 (04/25 0611) Resp:  [14-29] 20 (04/25 0611) BP: (99-136)/(56-97) 99/75 (04/25 0611) SpO2:  [93 %-100 %] 95 % (04/25 0833) Weight:  [103 lb 11.2 oz (47 kg)-105 lb 14.4 oz (48 kg)] 103 lb 11.2 oz (47 kg) (04/25 0611)  2 liters  Filed Weights   07/29/16 0942 07/29/16 1438 07/30/16 0611  Weight: 109 lb (49.4 kg) 105 lb 14.4 oz (48 kg) 103 lb 11.2 oz (47 kg)    Intake/Output Summary (Last 24 hours) at 07/30/16 1000 Last data filed at 07/30/16 0956  Gross per  24 hour  Intake              480 ml  Output             1450 ml  Net             -970 ml   General appearance:  83 Year old  female, well nourished NAD,  conversant  Eyes: anicteric sclerae icteric , moist conjunctivae; PERRL, EOMI bilaterally. Mouth:  membranes and no mucosal ulcerations; normal hard and soft palate Neck: Trachea midline; neck supple, no JVD Lungs/chest: crackles in bases, with normal respiratory effort and no intercostal retractions CV: RRR, no MRGs  Abdomen: Soft, non-tender; no masses or HSM Extremities: No peripheral edema or extremity lymphadenopathy Skin: Normal temperature, turgor and texture; no rash, ulcers or subcutaneous nodules Neuro/ Psych: Appropriate affect, alert and oriented to person, place and time  Recent Labs Lab 07/29/16 0941 07/30/16 0557  NA 140 138  K 3.4* 3.3*  CL 105 100*  CO2 25 26  BUN 18 21*  CREATININE 1.74* 1.79*  GLUCOSE 96 97    Recent Labs Lab 07/29/16 0941 07/30/16 0557  HGB 16.2* 15.2*  HCT 48.9* 46.1*  WBC 9.7 8.4  PLT 291 312   Dg Chest 2 View  Result Date: 07/29/2016 CLINICAL DATA:  Shortness of breath today, coughing for few  weeks, dry cough, history atrial fibrillation/flutter, chronic kidney disease, COPD, former smoker EXAM: CHEST  2 VIEW COMPARISON:  05/09/2016 FINDINGS: Upper normal size of cardiac silhouette. Atherosclerotic calcification aorta. Mediastinal contours and pulmonary vascularity normal. Small bibasilar pleural effusions. Underlying emphysematous and chronic bronchitic changes consistent with COPD. Questionable RIGHT basilar infiltrate. No pneumothorax. Bones demineralized. IMPRESSION: COPD changes with small BILATERAL pleural effusions and questionable RIGHT basilar infiltrate. Aortic atherosclerosis. Electronically Signed   By: Ulyses Southward M.D.   On: 07/29/2016 10:19   Ct Chest Wo Contrast  Result Date: 07/29/2016 CLINICAL DATA:  Shortness of breath since January. Status post flu/pneumonia.  EXAM: CT CHEST WITHOUT CONTRAST TECHNIQUE: Multidetector CT imaging of the chest was performed following the standard protocol without IV contrast. COMPARISON:  Plain films 07/29/2016.  No prior CT. FINDINGS: Cardiovascular: Aortic and branch vessel atherosclerosis. Aberrant right subclavian artery, traversing posterior to the esophagus. Mild cardiomegaly. Multivessel coronary artery atherosclerosis. Mediastinum/Nodes: Borderline precarinal adenopathy at 10 mm on image 66/series 3. Hilar regions poorly evaluated without intravenous contrast. Lungs/Pleura: Small, right larger than left pleural effusions. Moderate to marked centrilobular emphysema. Mild biapical pleural-parenchymal scarring. Dependent right base opacity is most consistent with atelectasis and scar. Upper Abdomen: Cholecystectomy. Normal imaged portions of the spleen, stomach, pancreas, adrenal glands. Musculoskeletal: Thoracolumbar spondylosis with S-shaped thoracic spine curvature. IMPRESSION: 1. Moderate-to-marked emphysema. 2. Cardiomegaly with small bilateral pleural effusions. Question fluid overload or mild CHF. 3.  Coronary artery atherosclerosis. Aortic atherosclerosis. 4. Borderline thoracic adenopathy, favored to be reactive. Electronically Signed   By: Jeronimo Greaves M.D.   On: 07/29/2016 12:12   SIGNIFICANT EVENTS  4/24  Admit   STUDIES:  EKG 4/24 >> Aflutter 4:1   Impression/plan Acute on chronic hypoxic respiratory failure  Acute diastolic heart failure  Pulmonary edema  COPD (moderate obstruction) Cor Pulmonale  Volume overload.   Discussion  Acute on chronic hypoxic resp failure in setting of decompensated diastolic HF and cor pulmonale. Most likely exacerbated by chronic hypoxia and not using home O2.  Plan f/u repeat echo Cont lasix as BUN/cr allow Cont BDs No pulse steroids Walking oximetry prior to dc Needs oxygen 24/7  Simonne Martinet ACNP-BC [facility] Pulmonary/Critical Care Pager # [phone] OR #  [phone] if no answer  STAFF NOTE: I, Rory Percy, MD FACP have personally reviewed patient's available data, including medical history, events of note, physical examination and test results as part of my evaluation. I have discussed with resident/NP and other care providers such as pharmacist, RN and RRT. In addition, I personally evaluated patient and elicited key findings of:  awake alert in chair, no distress but doesn't feel sob Is better then day prior, lungs slight coarse and moving air distant, no active bronchospasm, al films and CT reviewed no overt radiographic evidence of chf but she descrobes edema which has resolved on lower ext, will continue diuresis and follow clinical progress, if not progress noted despite neg balance, then we should conisder steroids increase, will also consult nutrition to improve protein intake, will follow her response again to diuretic  Mcarthur Rossetti. Tyson Alias, MD, FACP Pgr: [phone] [facility] 07/30/2016 10:26 AM

## 2016-07-30 NOTE — Progress Notes (Signed)
Informed Dr. Benjamine Mola that patients HR is in the 140's-150's BP 109/83 and patient is midly short of breath. Per MD obtain an EKG and give held dose of Cardizem now.

## 2016-07-31 DIAGNOSIS — E440 Moderate protein-calorie malnutrition: Secondary | ICD-10-CM | POA: Insufficient documentation

## 2016-07-31 DIAGNOSIS — N183 Chronic kidney disease, stage 3 (moderate): Secondary | ICD-10-CM

## 2016-07-31 DIAGNOSIS — J449 Chronic obstructive pulmonary disease, unspecified: Secondary | ICD-10-CM

## 2016-07-31 LAB — CBC
HCT: 43.4 % (ref 36.0–46.0)
HEMOGLOBIN: 14.4 g/dL (ref 12.0–15.0)
MCH: 31.2 pg (ref 26.0–34.0)
MCHC: 33.2 g/dL (ref 30.0–36.0)
MCV: 93.9 fL (ref 78.0–100.0)
PLATELETS: 282 10*3/uL (ref 150–400)
RBC: 4.62 MIL/uL (ref 3.87–5.11)
RDW: 14.6 % (ref 11.5–15.5)
WBC: 9.1 10*3/uL (ref 4.0–10.5)

## 2016-07-31 LAB — BASIC METABOLIC PANEL
Anion gap: 13 (ref 5–15)
BUN: 31 mg/dL — AB (ref 6–20)
CHLORIDE: 100 mmol/L — AB (ref 101–111)
CO2: 26 mmol/L (ref 22–32)
Calcium: 9.1 mg/dL (ref 8.9–10.3)
Creatinine, Ser: 2.12 mg/dL — ABNORMAL HIGH (ref 0.44–1.00)
GFR calc Af Amer: 23 mL/min — ABNORMAL LOW (ref >60)
GFR calc non Af Amer: 20 mL/min — ABNORMAL LOW (ref >60)
GLUCOSE: 104 mg/dL — AB (ref 65–99)
POTASSIUM: 3.1 mmol/L — AB (ref 3.5–5.1)
Sodium: 139 mmol/L (ref 135–145)

## 2016-07-31 LAB — PROCALCITONIN: Procalcitonin: <0.1 ng/mL

## 2016-07-31 MED ORDER — POTASSIUM CHLORIDE CRYS ER 20 MEQ PO TBCR
40.0000 meq | EXTENDED_RELEASE_TABLET | Freq: Once | ORAL | Status: AC
  Administered 2016-07-31: 40 meq via ORAL
  Filled 2016-07-31: qty 2

## 2016-07-31 NOTE — Progress Notes (Signed)
PROGRESS NOTE    [name]  GNF:[id number] DOB: 1928/04/05 DOA: 07/29/2016 PCP: Enrique Sack, MD   Outpatient Specialists:     Brief Narrative:  [name] is a 83 y.o. female with medical history significant of atrial flutter on pelvic rest, congestive heart failure, C KD, glaucoma, diverticulosis, hyperlipidemia, PVD, protein calorie malnutrition.    Patient presented to [facility] emergency room via EMS after family became very concerned over her respiratory status. Patient uses 2 L of O2 by nasal cannula which has not changed. Home O2 saturations remained above 90% on this regimen though she has had marked increased respiratory effort during this time. Symptoms are worsened with exertion and improved with rest. When off of her home oxygen her saturations dropped into the mid 60s at times. Patient endorsing an occasional dry cough but denies fevers, chest pain, palpitations, abdominal pain, dysuria, frequency, flank pain, neck stiffness, headache, focal neurological deficit. Symptoms have been ongoing for the last 2 weeks and were associated with lower extremity edema which family states was large as 4+. Patient with a general physical and respiratory decline ever since contracting pneumonia in January 2018. On 07/25/2016 patient was started on Lasix 20 mg daily. In a short amount of time this resolved patient's lower extremity edema. Family denies any known episodes of RVR that may be related to her feelings of shortness of breath and fatigue and weakness. Of note patient was started on oxygen a couple of weeks ago by her pulmonologist, Dr. Craige Cotta.     Assessment & Plan:   Active Problems:   CKD (chronic kidney disease), stage III   High cholesterol   COPD (chronic obstructive pulmonary disease) (HCC)   Respiratory distress   Acute respiratory distress   CAD (coronary artery disease)   Chronic musculoskeletal pain   COPD without exacerbation (HCC)   Acute diastolic  (congestive) heart failure (HCC)   CRI (chronic renal insufficiency), stage 4 (severe) (HCC)   Acute on chronic combined systolic and diastolic CHF (congestive heart failure) (HCC)   Malnutrition of moderate degree   Acute on chronic respiratory failure: secondary to acute on chronic diastolic CHF w/ underlying advanced COPD w/o exacerbation.  -baseline 2L [place]  -Respiratory status has gradually worsened since contracting pneumonia in January 2018.  -Echo from 05/2016 w/ EF 55% but unable to eval diastolic function, but increased PA pressure noted at 33. Echo prior to onset of Afib noting diastolic dysfunction.  -echo 2018: The patient was in atrial flutter. Normal LV size with EF 55-60%.   Normal RV size with mildly decreased systolic function. No   significant valvular abnormalities. - given IV lasix but unable to tolerate due to low BP- change to PO 20 mg  - Strict I/O, Daily wts - Pulm consult per pt request- follow up arranged - Continue Prednisone, O2, albuterol, Advair - Procalcitonin negative  Aflutter: Rate controlled - Continue Eliquis, Dilt -cardiology consult appreciated  CKD: unclear baseline - daily BMP  CAD:  - continue statin and ASA  MSK pain: at baseline  - continue tramadol  Hypokalemia -replete and check Mg  DVT prophylaxis:  Fully anticoagulated   Code Status: DNR   Family Communication: patient  Disposition Plan:  Home when [facility] with cards- 24/7 O2   Consultants:   pulm  cards   Subjective: HR still rapid at times  Objective: Vitals:   07/31/16 0508 07/31/16 1013 07/31/16 1026 07/31/16 1235  BP: 113/66  99/63 134/67  Pulse: 74   (!)  104  Resp: 18   20  Temp: 97.6 F (36.4 C)   97.7 F (36.5 C)  TempSrc: Oral   Oral  SpO2: 93% 95%  98%  Weight: 48 kg (105 lb 14.4 oz)     Height:        Intake/Output Summary (Last 24 hours) at 07/31/16 1408 Last data filed at 07/31/16 1028  Gross per 24 hour  Intake             1197 ml    Output              301 ml  Net              896 ml   Filed Weights   07/29/16 1438 07/30/16 0611 07/31/16 0508  Weight: 48 kg (105 lb 14.4 oz) 47 kg (103 lb 11.2 oz) 48 kg (105 lb 14.4 oz)    Examination:  General exam: NAd- sitting in chair with O2 Respiratory system: diminished, prolonged exp wheeze Cardiovascular system: flutter Gastrointestinal system: +BS, soft Central nervous system: Alert and oriented. No focal neurological deficits. Extremities: Symmetric 5 x 5 power.    Data Reviewed: I have personally reviewed following labs and imaging studies  CBC:  Recent Labs Lab 07/29/16 0941 07/30/16 0557 07/31/16 0410  WBC 9.7 8.4 9.1  NEUTROABS 6.5  --   --   HGB 16.2* 15.2* 14.4  HCT 48.9* 46.1* 43.4  MCV 93.9 93.1 93.9  PLT 291 312 282   Basic Metabolic Panel:  Recent Labs Lab 07/29/16 0941 07/30/16 0557 07/31/16 0410  NA 140 138 139  K 3.4* 3.3* 3.1*  CL 105 100* 100*  CO2 GLUCOSE 96 97 104*  BUN 18 21* 31*  CREATININE 1.74* 1.79* 2.12*  CALCIUM 9.2 8.7* 9.1   GFR: Estimated Creatinine Clearance: 14.2 mL/min (A) (by C-G formula based on SCr of 2.12 mg/dL (H)). Liver Function Tests: No results for input(s): AST, ALT, ALKPHOS, BILITOT, PROT, ALBUMIN in the last 168 hours. No results for input(s): LIPASE, AMYLASE in the last 168 hours. No results for input(s): AMMONIA in the last 168 hours. Coagulation Profile: No results for input(s): INR, PROTIME in the last 168 hours. Cardiac Enzymes:  Recent Labs Lab 07/29/16 0941  TROPONINI <0.03   BNP (last 3 results) No results for input(s): PROBNP in the last 8760 hours. HbA1C: No results for input(s): HGBA1C in the last 72 hours. CBG: No results for input(s): GLUCAP in the last 168 hours. Lipid Profile: No results for input(s): CHOL, HDL, LDLCALC, TRIG, CHOLHDL, LDLDIRECT in the last 72 hours. Thyroid Function Tests: No results for input(s): TSH, T4TOTAL, FREET4, T3FREE, THYROIDAB in  the last 72 hours. Anemia Panel: No results for input(s): VITAMINB12, FOLATE, FERRITIN, TIBC, IRON, RETICCTPCT in the last 72 hours. Urine analysis:    Component Value Date/Time   COLORURINE STRAW (A) 04/14/2016 1345   APPEARANCEUR CLEAR 04/14/2016 1345   LABSPEC 1.003 (L) 04/14/2016 1345   PHURINE 6.0 04/14/2016 1345   GLUCOSEU NEGATIVE 04/14/2016 1345   HGBUR NEGATIVE 04/14/2016 1345   BILIRUBINUR NEGATIVE 04/14/2016 1345   KETONESUR NEGATIVE 04/14/2016 1345   PROTEINUR NEGATIVE 04/14/2016 1345   NITRITE NEGATIVE 04/14/2016 1345   LEUKOCYTESUR NEGATIVE 04/14/2016 1345     )No results found for this or any previous visit (from the past 240 hour(s)).    Anti-infectives    None       Radiology Studies: No results found.  Scheduled Meds: . apixaban  2.5 mg Oral BID  . atorvastatin  5 mg Oral q1800  . diltiazem  240 mg Oral Daily  . feeding supplement (ENSURE ENLIVE)  237 mL Oral BID BM  . furosemide  20 mg Oral Daily  . mometasone-formoterol  2 puff Inhalation BID  . predniSONE  5 mg Oral Q breakfast  . sodium chloride flush  3 mL Intravenous Q12H  . umeclidinium bromide  1 puff Inhalation Daily   Continuous Infusions: . sodium chloride       LOS: 1 day    Time spent: 15 min    JESSICA U VANN, DO [facility] Pager [phone]  If 7PM-7AM, please contact night-coverage [email or web address] Password [email or web address] 07/31/2016, 2:08 PM

## 2016-07-31 NOTE — Progress Notes (Signed)
Call received from Dr. Benjamine Mola regarding pt BP90's and heart up.  See later notes.  Instructed to hold lasix and give pt cardizem as heart rate going up.  Will continue to monitor.  Amanda Pea, Charity fundraiser.

## 2016-07-31 NOTE — Evaluation (Signed)
Physical Therapy Evaluation Patient Details Name: [name] MRN: [id number] DOB: 10/24/1927 Today's Date: 07/31/2016   History of Present Illness  83yo female with onset of SOB, acute respiratory failure from CHF and recent PNA, now admitted with LE edema and was already on 2L O2 at home.  PMHx:  PAD, a-flutter, CHF, COPD, CKD, EF 55%  Clinical Impression  Pt is notably up to walk with minor help and progressed to chair with PT assisting.  Her PLOF was gait on no AD, and expect her to likely be there as she goes home.  Will check back with her to be sure she can achieve this, and will have HHPT see her to monitor her O2 sats, which declined to 90% with gait from 93% at rest on 2L O2.  Her acute therapy plan is for gait and strengthening, monitoring of vitals and progressing to more challenging balance tasks as she is able to handle with her O2.  Supplemental O2 needed for now and was her PLOF.    Follow Up Recommendations Home health PT;Supervision for mobility/OOB    Equipment Recommendations  Other (comment) (will await progression of PT but none for now)    Recommendations for Other Services       Precautions / Restrictions Precautions Precautions: Fall (telemetry) Restrictions Weight Bearing Restrictions: No Other Position/Activity Restrictions: requires 2L O2      Mobility  Bed Mobility Overal bed mobility: Needs Assistance Bed Mobility: Supine to Sit     Supine to sit: Min assist     General bed mobility comments: mainly assisted trunk for sitting upright  Transfers Overall transfer level: Needs assistance Equipment used: 1 person hand held assist Transfers: Sit to/from [employer] Sit to Stand: Min assist Stand pivot transfers: Min guard;Min assist       General transfer comment: Pt mainly needs a little help to pwer up  Ambulation/Gait Ambulation/Gait assistance: Min guard;Min assist Ambulation Distance (Feet): 16 Feet Assistive device:  1 person hand held assist Gait Pattern/deviations: Step-through pattern;Narrow base of support;Trunk flexed;Shuffle;Decreased stride length Gait velocity: reduced Gait velocity interpretation: Below normal speed for age/gender General Gait Details: small steps and close guard due to her O2 and low O2 sats  Stairs            Wheelchair Mobility    Modified Rankin (Stroke Patients Only)       Balance Overall balance assessment: Needs assistance Sitting-balance support: Feet supported Sitting balance-Leahy Scale: Fair     Standing balance support: Single extremity supported Standing balance-Leahy Scale: Fair Standing balance comment: less than fair dynamically                              Pertinent Vitals/Pain Pain Assessment: No/denies pain    Home Living Family/patient expects to be discharged to:: Private residence Living Arrangements: Spouse/significant other Available Help at Discharge: Family;Available 24 hours/day Type of Home: House Home Access: Level entry     Home Layout: One level Home Equipment: None      Prior Function Level of Independence: Independent         Comments: Pt states she never needed AD and was very independent previously     Hand Dominance        Extremity/Trunk Assessment   Upper Extremity Assessment Upper Extremity Assessment: Overall WFL for tasks assessed    Lower Extremity Assessment Lower Extremity Assessment: Generalized weakness    Cervical / Trunk  Assessment Cervical / Trunk Assessment: Kyphotic  Communication   Communication: No difficulties  Cognition Arousal/Alertness: Awake/alert Behavior During Therapy: WFL for tasks assessed/performed Overall Cognitive Status: Within Functional Limits for tasks assessed                                        General Comments General comments (skin integrity, edema, etc.): Pt was able to walk with min guard to min assist but is likely to  step on through this to mod I given her PLOF.  Will progress gait to longer trips with O2 as pt used 2L O2 at baseline.    Exercises     Assessment/Plan    PT Assessment Patient needs continued PT services  PT Problem List Decreased strength;Decreased range of motion;Decreased activity tolerance;Decreased balance;Decreased mobility;Decreased coordination;Decreased knowledge of use of DME;Decreased safety awareness;Cardiopulmonary status limiting activity       PT Treatment Interventions DME instruction;Gait training;Functional mobility training;Therapeutic activities;Therapeutic exercise;Balance training;Neuromuscular re-education;Patient/family education    PT Goals (Current goals can be found in the Care Plan section)  Acute Rehab PT Goals Patient Stated Goal: to walk and feel better to get home PT Goal Formulation: With patient Time For Goal Achievement: 08/07/16 Potential to Achieve Goals: Good    Frequency Min 3X/week   Barriers to discharge   has husband at home and has level home    Co-evaluation               End of Session Equipment Utilized During Treatment: Gait belt;Oxygen Activity Tolerance: Patient limited by fatigue;Treatment limited secondary to medical complications (Comment) (lowered O2 sats with mobility) Patient left: in chair;with call bell/phone within reach;with chair alarm set Nurse Communication: Mobility status PT Visit Diagnosis: Muscle weakness (generalized) (M62.81)    Time: 1191-4782 PT Time Calculation (min) (ACUTE ONLY): 20 min   Charges:   PT Evaluation $PT Eval Moderate Complexity: 1 Procedure     PT G Codes:   PT G-Codes **NOT FOR INPATIENT CLASS** Functional Assessment Tool Used: AM-PAC 6 Clicks Basic Mobility      Ivar Drape 07/31/2016, 11:07 AM   Samul Dada, PT MS Acute Rehab Dept. Number: [facility] [id number] and [facility] [phone]

## 2016-07-31 NOTE — Progress Notes (Signed)
Name: [name] MRN: [id number] DOB: 07-Aug-1927    ADMISSION DATE:  07/29/2016 CONSULTATION DATE:  07/29/2016  REFERRING MD :  Dr. Konrad Dolores  CHIEF COMPLAINT:  Shortness of Breath   SUBJECTIVE: Feels better.  Has been getting up and going to BR without oxygen. Feels well but don't know what her oxygen sats do during this.   VITAL SIGNS: Temp:  [97.5 F (36.4 C)-98 F (36.7 C)] 97.6 F (36.4 C) (04/26 0508) Pulse Rate:  [74-131] 74 (04/26 0508) Resp:  [18] 18 (04/26 0508) BP: (86-113)/(52-83) 113/66 (04/26 0508) SpO2:  [93 %-100 %] 93 % (04/26 0508) Weight:  [105 lb 14.4 oz (48 kg)] 105 lb 14.4 oz (48 kg) (04/26 0508)  2 liters  Filed Weights   07/29/16 1438 07/30/16 0611 07/31/16 0508  Weight: 105 lb 14.4 oz (48 kg) 103 lb 11.2 oz (47 kg) 105 lb 14.4 oz (48 kg)    Intake/Output Summary (Last 24 hours) at 07/31/16 7829 Last data filed at 07/31/16 0600  Gross per 24 hour  Intake             1060 ml  Output              301 ml  Net              759 ml   General appearance:  Delightful 83 Year old female, well nourished  NAD,   conversant  Eyes: anicteric sclerae, moist conjunctivae; PERRL, EOMI bilaterally. Mouth:  membranes and no mucosal ulcerations; normal hard and soft palate Neck: Trachea midline; neck supple, no JVD Lungs/chest: posterior crackles bilaterally, with normal respiratory effort and no intercostal retractions CV: RRR, no MRGs  Abdomen: Soft, non-tender; no masses or HSM Extremities: No peripheral edema or extremity lymphadenopathy Skin: Normal temperature, turgor and texture; no rash, ulcers or subcutaneous nodules Psych: Appropriate affect, alert and oriented to person, place and time   Recent Labs Lab 07/29/16 0941 07/30/16 0557 07/31/16 0410  NA 140 138 139  K 3.4* 3.3* 3.1*  CL 105 100* 100*  CO2 BUN 18 21* 31*  CREATININE 1.74* 1.79* 2.12*  GLUCOSE 96 97 104*    Recent Labs Lab 07/29/16 0941 07/30/16 0557  07/31/16 0410  HGB 16.2* 15.2* 14.4  HCT 48.9* 46.1* 43.4  WBC 9.7 8.4 9.1  PLT 291 312 282   Dg Chest 2 View  Result Date: 07/29/2016 CLINICAL DATA:  Shortness of breath today, coughing for few weeks, dry cough, history atrial fibrillation/flutter, chronic kidney disease, COPD, former smoker EXAM: CHEST  2 VIEW COMPARISON:  05/09/2016 FINDINGS: Upper normal size of cardiac silhouette. Atherosclerotic calcification aorta. Mediastinal contours and pulmonary vascularity normal. Small bibasilar pleural effusions. Underlying emphysematous and chronic bronchitic changes consistent with COPD. Questionable RIGHT basilar infiltrate. No pneumothorax. Bones demineralized. IMPRESSION: COPD changes with small BILATERAL pleural effusions and questionable RIGHT basilar infiltrate. Aortic atherosclerosis. Electronically Signed   By: Ulyses Southward M.D.   On: 07/29/2016 10:19   Ct Chest Wo Contrast  Result Date: 07/29/2016 CLINICAL DATA:  Shortness of breath since January. Status post flu/pneumonia. EXAM: CT CHEST WITHOUT CONTRAST TECHNIQUE: Multidetector CT imaging of the chest was performed following the standard protocol without IV contrast. COMPARISON:  Plain films 07/29/2016.  No prior CT. FINDINGS: Cardiovascular: Aortic and branch vessel atherosclerosis. Aberrant right subclavian artery, traversing posterior to the esophagus. Mild cardiomegaly. Multivessel coronary artery atherosclerosis. Mediastinum/Nodes: Borderline precarinal adenopathy at 10 mm on image 66/series 3. Hilar regions  poorly evaluated without intravenous contrast. Lungs/Pleura: Small, right larger than left pleural effusions. Moderate to marked centrilobular emphysema. Mild biapical pleural-parenchymal scarring. Dependent right base opacity is most consistent with atelectasis and scar. Upper Abdomen: Cholecystectomy. Normal imaged portions of the spleen, stomach, pancreas, adrenal glands. Musculoskeletal: Thoracolumbar spondylosis with S-shaped  thoracic spine curvature. IMPRESSION: 1. Moderate-to-marked emphysema. 2. Cardiomegaly with small bilateral pleural effusions. Question fluid overload or mild CHF. 3.  Coronary artery atherosclerosis. Aortic atherosclerosis. 4. Borderline thoracic adenopathy, favored to be reactive. Electronically Signed   By: Jeronimo Greaves M.D.   On: 07/29/2016 12:12   SIGNIFICANT EVENTS  4/24  Admit   STUDIES:  EKG 4/24 >> Aflutter 4:1 Echo 4/25: EF 55-60% NML RV size w/ mild reduction in systolic fxn  Impression/plan Acute on chronic hypoxic respiratory failure Acute diastolic heart failure  Cor pulmonale  Volume overload COPD.  Aflutter' Diuretic related Hypovolemia  Hypokalemia   Discussion  Think primarily this has been an issue of chronic hypoxia. She does have COPD and we have demonstrated in our office exertional hypoxia for some time now. In spite of having O2 at home she has been reluctant to use it. This has culminated in decompensated cor pulmonale and acute diastolic HF. Her ECHO indeed demonstrated RV systolic dysfunction. I suspect she is now on dry side.   Plan/rec Cont home BDs now and at dc Decide on diuretic regimen (may be able to just use weight or ankle swelling as PRN guide) No pulse steroids MOST IMPORTANTLY: oxygen 24/7-->need to determine her requirements w/ walking oximetry prior to dc.  We will s/o  Will assure she has f/u with [place] in computer  Simonne Martinet ACNP-BC [facility] Pulmonary/Critical Care Pager # [phone] OR # [phone] if no answer  STAFF NOTE: I, Rory Percy, MD FACP have personally reviewed patient's available data, including medical history, events of note, physical examination and test results as part of my evaluation. I have discussed with resident/NP and other care providers such as pharmacist, RN and RRT. In addition, I personally evaluated patient and elicited key findings of: finally some clinical progress, reports less SOB overnight, likely an  effect from 1.2 liters neg from day prior, lungs with excellent air entry no active bronchospasm, crt tend up, consider holding or reducing lasix today, echo reviewed with mild PA htn and RV down slight in function from hypoxia ( not wearing O2), I discussed the physicology of hypoxia with her and importance of wearing O2, he edema has resolved on lower ext which was from rt heart dz acute from hypoxia, will have pulm follow up, will sign off, will need to ambulate her and check sats and O2 needs pre dc   Mcarthur Rossetti. Tyson Alias, MD, FACP Pgr: [phone] [place] [facility] 07/31/2016 11:55 AM

## 2016-07-31 NOTE — Progress Notes (Signed)
Progress Note  Patient Name: [name] Date of Encounter: 07/31/2016  Primary Cardiologist: new- [place]  Subjective   Feels better today. States breathing is OK. No palpitations.  Inpatient Medications    Scheduled Meds: . apixaban  2.5 mg Oral BID  . atorvastatin  5 mg Oral q1800  . diltiazem  240 mg Oral Daily  . feeding supplement (ENSURE ENLIVE)  237 mL Oral BID BM  . furosemide  20 mg Oral Daily  . mometasone-formoterol  2 puff Inhalation BID  . predniSONE  5 mg Oral Q breakfast  . sodium chloride flush  3 mL Intravenous Q12H  . umeclidinium bromide  1 puff Inhalation Daily   Continuous Infusions: . sodium chloride     PRN Meds: sodium chloride, albuterol, ondansetron **OR** ondansetron (ZOFRAN) IV, sodium chloride flush, traMADol-acetaminophen   Vital Signs    Vitals:   07/30/16 1245 07/30/16 1600 07/30/16 2126 07/31/16 0508  BP: (!) 113/66  Pulse:  87 87 74  Resp:   18 18  Temp:   97.5 F (36.4 C) 97.6 F (36.4 C)  TempSrc:   Oral Oral  SpO2:   94% 93%  Weight:    105 lb 14.4 oz (48 kg)  Height:        Intake/Output Summary (Last 24 hours) at 07/31/16 1013 Last data filed at 07/31/16 0900  Gross per 24 hour  Intake             1060 ml  Output              301 ml  Net              759 ml   Filed Weights   07/29/16 1438 07/30/16 0611 07/31/16 0508  Weight: 105 lb 14.4 oz (48 kg) 103 lb 11.2 oz (47 kg) 105 lb 14.4 oz (48 kg)    Telemetry    Atrial flutter with controlled rate at rest. With activity HR increases - Personally Reviewed  ECG    None  Physical Exam   GEN: Thin WF No acute distress.   Neck: No JVD Cardiac: IRRR, no murmurs, rubs, or gallops.  Respiratory: Clear to auscultation bilaterally. GI: Soft, nontender, non-distended  MS: No edema; No deformity. Neuro:  Nonfocal  Psych: Normal affect   Labs    Chemistry Recent Labs Lab 07/29/16 0941 07/30/16 0557 07/31/16 0410  NA 140 138 139  K 3.4*  3.3* 3.1*  CL 105 100* 100*  CO2 GLUCOSE 96 97 104*  BUN 18 21* 31*  CREATININE 1.74* 1.79* 2.12*  CALCIUM 9.2 8.7* 9.1  GFRNONAA 25* 24* 20*  GFRAA 29* 28* 23*  ANIONGAP Hematology Recent Labs Lab 07/29/16 0941 07/30/16 0557 07/31/16 0410  WBC 9.7 8.4 9.1  RBC 5.21* 4.95 4.62  HGB 16.2* 15.2* 14.4  HCT 48.9* 46.1* 43.4  MCV 93.9 93.1 93.9  MCH 31.1 30.7 31.2  MCHC 33.1 33.0 33.2  RDW 14.3 14.6 14.6  PLT 291 312 282    Cardiac Enzymes Recent Labs Lab 07/29/16 0941  TROPONINI <0.03    Recent Labs Lab 07/29/16 1026  TROPIPOC 0.01     BNP Recent Labs Lab 07/29/16 0941  BNP 218.4*     DDimer No results for input(s): DDIMER in the last 168 hours.   Radiology    Dg Chest 2 View  Result Date: 07/29/2016 CLINICAL DATA:  Shortness of  breath today, coughing for few weeks, dry cough, history atrial fibrillation/flutter, chronic kidney disease, COPD, former smoker EXAM: CHEST  2 VIEW COMPARISON:  05/09/2016 FINDINGS: Upper normal size of cardiac silhouette. Atherosclerotic calcification aorta. Mediastinal contours and pulmonary vascularity normal. Small bibasilar pleural effusions. Underlying emphysematous and chronic bronchitic changes consistent with COPD. Questionable RIGHT basilar infiltrate. No pneumothorax. Bones demineralized. IMPRESSION: COPD changes with small BILATERAL pleural effusions and questionable RIGHT basilar infiltrate. Aortic atherosclerosis. Electronically Signed   By: Ulyses Southward M.D.   On: 07/29/2016 10:19   Ct Chest Wo Contrast  Result Date: 07/29/2016 CLINICAL DATA:  Shortness of breath since January. Status post flu/pneumonia. EXAM: CT CHEST WITHOUT CONTRAST TECHNIQUE: Multidetector CT imaging of the chest was performed following the standard protocol without IV contrast. COMPARISON:  Plain films 07/29/2016.  No prior CT. FINDINGS: Cardiovascular: Aortic and branch vessel atherosclerosis. Aberrant right subclavian artery,  traversing posterior to the esophagus. Mild cardiomegaly. Multivessel coronary artery atherosclerosis. Mediastinum/Nodes: Borderline precarinal adenopathy at 10 mm on image 66/series 3. Hilar regions poorly evaluated without intravenous contrast. Lungs/Pleura: Small, right larger than left pleural effusions. Moderate to marked centrilobular emphysema. Mild biapical pleural-parenchymal scarring. Dependent right base opacity is most consistent with atelectasis and scar. Upper Abdomen: Cholecystectomy. Normal imaged portions of the spleen, stomach, pancreas, adrenal glands. Musculoskeletal: Thoracolumbar spondylosis with S-shaped thoracic spine curvature. IMPRESSION: 1. Moderate-to-marked emphysema. 2. Cardiomegaly with small bilateral pleural effusions. Question fluid overload or mild CHF. 3.  Coronary artery atherosclerosis. Aortic atherosclerosis. 4. Borderline thoracic adenopathy, favored to be reactive. Electronically Signed   By: Jeronimo Greaves M.D.   On: 07/29/2016 12:12    Cardiac Studies   Echo: Study Conclusions  - Left ventricle: The cavity size was normal. Wall thickness was   normal. Indeterminant diastolic function (atrial flutter).   Systolic function was normal. The estimated ejection fraction was   in the range of 55% to 60%. Wall motion was normal; there were no   regional wall motion abnormalities. - Aortic valve: There was no stenosis. - Mitral valve: There was trivial regurgitation. - Right ventricle: The cavity size was normal. Systolic function   was mildly reduced. - Tricuspid valve: Peak RV-RA gradient (S): 31 mm Hg. - Pulmonary arteries: PA peak pressure: 34 mm Hg (S). - Inferior vena cava: The vessel was normal in size. The   respirophasic diameter changes were in the normal range (>= 50%),   consistent with normal central venous pressure.  Impressions:  - The patient was in atrial flutter. Normal LV size with EF 55-60%.   Normal RV size with mildly decreased  systolic function. No   significant valvular abnormalities.   Patient Profile     83 y.o. female with long history of Pafib/flutter, admitted with acute diastolic CHF and COPD exacerbation.   Assessment & Plan    1. Paroxysmal atrial fibrillation/flutter. Rate is generally well controlled on po Cardizem. She has had this for years. Not a good candidate for AAD therapy due to CKD and COPD. Recommend continued rate control. She has a [place] Vasc score of 5 (age,sex, CHF, PAD). She is on low dose Eliquis which is appropriate for her age, body weight and renal dysfunction. My concern is that she is approaching the point where it may not be safe to continue Eliquis with worsening renal function. She was stage 3 and now stage 4. This is exacerbated by recent diuretics. I think we will need to watch renal function closely and see at what point it  stabilizes. REcommend stopping her ASA to minimize her bleeding risk  2. Acute on chronic diastolic CHF. Good response to IV lasix with 6 lbs weight loss.  Clinically much better. Now BP soft so  Lasix reduced  to 20 mg daily to avoid hypotension and worsening kidney disease. May need to adjust and take extra as needed for weight gain or increased edema. Echo shows normal LV function and no significant valvular disease.  3. CKD acute on chronic stage 3>4. Creatinine up further today to 2.12. This probably still reflects effects of IV diuresis. Lasix dose reduced as above. Would recommend watching here today and repeat BMET in am.   4. COPD - plan on home oxygen therapy. Recommend she use continually with some RV dysfunction.   Signed, Hiren Peplinski [place], MD  07/31/2016, 10:13 AM

## 2016-07-31 NOTE — Progress Notes (Signed)
Pt's heart rate up to 153 while up to bathroom at around 9am this morning.  Pt asymptomatic.  BP 99/63, Pulse 108 with machine and 98/64.  Due for cardizem  po this am and lasix  po.  Notified Dr. Verner Mould.  Awaiting return call.  Will continue to monitor.  Reginae Wolfrey, RN

## 2016-08-01 DIAGNOSIS — J9601 Acute respiratory failure with hypoxia: Secondary | ICD-10-CM

## 2016-08-01 LAB — BASIC METABOLIC PANEL
Anion gap: 9 (ref 5–15)
BUN: 31 mg/dL — AB (ref 6–20)
CALCIUM: 8.9 mg/dL (ref 8.9–10.3)
CO2: 27 mmol/L (ref 22–32)
CREATININE: 1.96 mg/dL — AB (ref 0.44–1.00)
Chloride: 103 mmol/L (ref 101–111)
GFR calc Af Amer: 25 mL/min — ABNORMAL LOW (ref >60)
GFR calc non Af Amer: 22 mL/min — ABNORMAL LOW (ref >60)
GLUCOSE: 91 mg/dL (ref 65–99)
Potassium: 3.5 mmol/L (ref 3.5–5.1)
Sodium: 139 mmol/L (ref 135–145)

## 2016-08-01 LAB — MAGNESIUM: Magnesium: 2.2 mg/dL (ref 1.7–2.4)

## 2016-08-01 MED ORDER — ENSURE ENLIVE PO LIQD: 237.0000 mL | Freq: Two times a day (BID) | ORAL | 0 refills | Status: DC

## 2016-08-01 MED ORDER — FUROSEMIDE 20 MG PO TABS: 20.0000 mg | ORAL_TABLET | Freq: Every day | ORAL | 0 refills | Status: DC

## 2016-08-01 MED ORDER — POTASSIUM CHLORIDE ER 20 MEQ PO TBCR: 20.0000 meq | EXTENDED_RELEASE_TABLET | Freq: Every day | ORAL | 0 refills | Status: DC

## 2016-08-01 NOTE — Progress Notes (Signed)
CM talked to patient to offer choice for [facility] services; pt refused at this time. CM informed patient that if she changed her mind, her PCP can make the arrangements from his office; Alexis Goodell [phone]

## 2016-08-01 NOTE — Progress Notes (Signed)
Pt d/c to home at 1432.   Amanda Pea, RN

## 2016-08-01 NOTE — Progress Notes (Signed)
All d/c instructions explained and given to pt.  Verbalized understanding. .  Dewey Viens, RN. 

## 2016-08-01 NOTE — Progress Notes (Signed)
Pt slept well during the night, Vitals stable, no any sign of SOB and distress noted, no any complain of pain, will continue to monitor the patient. 

## 2016-08-01 NOTE — Discharge Summary (Signed)
PATIENT DETAILS Name: [name] Age: 83 y.o. Sex: female Date of Birth: [birth year] MRN: [id number]. Admitting Physician: Ozella Rocks, MD [id number], Lorenda Ishihara, MD  Admit Date: 07/29/2016 Discharge date: 08/01/2016  Recommendations for Outpatient Follow-up:  1. Follow up with PCP in 1-2 weeks 2. Please obtain BMP/CBC in one week 3. Please ensure follow-up with cardiology, pulmonology  Admitted From:  Home  Disposition: Home with home health services   Home Health:  Yes  Equipment/Devices: On chronic Oxygen 2L/m  Discharge Condition: Stable  CODE STATUS:  DNR  Diet recommendation:  Heart Healthy   Brief Summary: See H&P, Labs, Consult and Test reports for all details in brief, patient is a 83 year old female who presented to worsening shortness of breath, she was found to have acute hypoxemic respiratory failure secondary to acute decompensated heart failure in a setting of COPD. See below for further details  Brief Hospital Course: Acute on chronic hypoxemic respiratory failure: Significantly improved, back to her usual home regimen of oxygen via nasal cannula. Felt to be secondary to acute on chronic diastolic heart failure with underlying COPD playing a role. Improved with diuresis, being discharged home in a stable manner.  Acute on chronic diastolic CHF: Volume status significantly improved with IV Lasix. Assessed by cardiology today, recommendations are to discharge home on 20 mg of Lasix daily. 2-D echocardiogram showed preserved EF.  Paroxysmal atrial flutter/fibrillation: Back in sinus rhythm. Rate controlled with Cardizem, continue anticoagulation with Eliquis.[place] Vasc score of 5  History of COPD on home O2: Continue usual bronchodilator regimen-patient maintained on chronic steroids.  Chronic kidney disease stage III: Creatinine close to usual baseline, continue to follow periodically as an outpatient. Is on anticoagulation with Eliquis-if renal  function worsens, may need to be changed to a alternative agent.  Procedures/Studies: Echo 4/25>>  - The patient was in atrial flutter. Normal LV size with EF 55-60%.   Normal RV size with mildly decreased systolic function. No   significant valvular abnormalities.  Discharge Diagnoses:  Active Problems:   CKD (chronic kidney disease), stage III   High cholesterol   COPD (chronic obstructive pulmonary disease) (HCC)   Respiratory distress   Acute respiratory distress   CAD (coronary artery disease)   Chronic musculoskeletal pain   COPD without exacerbation (HCC)   Acute diastolic (congestive) heart failure (HCC)   CRI (chronic renal insufficiency), stage 4 (severe) (HCC)   Acute on chronic combined systolic and diastolic CHF (congestive heart failure) (HCC)   Malnutrition of moderate degree   Discharge Instructions:  Activity:  As tolerated with Full fall precautions    Discharge Instructions    (HEART FAILURE PATIENTS) Call MD:  Anytime you have any of the following symptoms: 1) 3 pound weight gain in 24 hours or 5 pounds in 1 week 2) shortness of breath, with or without a dry hacking cough 3) swelling in the hands, feet or stomach 4) if you have to sleep on extra pillows at night in order to breathe.    Complete by:  As directed    Diet - low sodium heart healthy    Complete by:  As directed    Increase activity slowly    Complete by:  As directed      Allergies as of 08/01/2016      Reactions   Codeine Sulfate Nausea And Vomiting, Other (See Comments)   headache      Medication List    STOP taking these medications   aspirin  81 MG tablet     TAKE these medications   albuterol 108 (90 Base) MCG/ACT inhaler Commonly known as:  PROAIR HFA Inhale 2 puffs into the lungs every 6 (six) hours as needed for wheezing or shortness of breath.   atorvastatin 10 MG tablet Commonly known as:  LIPITOR Take 5 mg by mouth daily.   cholecalciferol 1000 units  tablet Commonly known as:  VITAMIN D Take 3,000 Units by mouth daily.   diltiazem 120 MG tablet Commonly known as:  CARDIZEM Take 240 mg by mouth daily.   ELIQUIS 2.5 MG Tabs tablet Generic drug:  apixaban Take 2.5 mg by mouth 2 (two) times daily.   feeding supplement (ENSURE ENLIVE) Liqd Take 237 mLs by mouth 2 (two) times daily between meals.   Fluticasone-Salmeterol 250-50 MCG/DOSE Aepb Commonly known as:  ADVAIR Inhale 1 puff into the lungs 2 (two) times daily.   furosemide 20 MG tablet Commonly known as:  LASIX Take 1 tablet (20 mg total) by mouth daily. Start taking on:  08/02/2016   OXYGEN Inhale 2 L into the lungs daily.   Potassium Chloride ER 20 MEQ Tbcr Take 20 mEq by mouth daily.   predniSONE 10 MG tablet Commonly known as:  DELTASONE Take 5 mg by mouth daily with breakfast.   traMADol-acetaminophen 37.5-325 MG tablet Commonly known as:  ULTRACET Take 1-2 tablets by mouth 2 (two) times daily as needed for moderate pain.   TYLENOL 500 MG tablet Generic drug:  acetaminophen Take 500 mg by mouth every 4 (four) hours as needed for mild pain.   vitamin E 1000 UNIT capsule Take 2,000 Units by mouth daily.            Durable Medical Equipment        Start     Ordered   07/31/16 1403  For home use only DME oxygen  Once    Question Answer Comment  Mode or (Route) Nasal cannula   Liters per Minute 2   Frequency Continuous (stationary and portable oxygen unit needed)   Oxygen delivery system Gas      07/31/16 1402     Follow-up Information    Bevelyn Ngo, NP Follow up on 08/11/2016.   Specialty:  Pulmonary Disease Why:  1030am Contact information: [address] [place] [address] [phone]        GREEN, Lorenda Ishihara, MD. Schedule an appointment as soon as possible for a visit in 1 week(s).   Specialty:  Internal Medicine Contact information: [address] [place] [address] [phone]        Peter  [place], MD Follow up.   Specialty:  Cardiology Why:  office will call you for a follow up appointment Contact information: [address] [place] [address] [phone]          Allergies  Allergen Reactions  . Codeine Sulfate Nausea And Vomiting and Other (See Comments)    headache    Consultations:   cardiology and pulmonary/intensive care  Other Procedures/Studies: Dg Chest 2 View  Result Date: 07/29/2016 CLINICAL DATA:  Shortness of breath today, coughing for few weeks, dry cough, history atrial fibrillation/flutter, chronic kidney disease, COPD, former smoker EXAM: CHEST  2 VIEW COMPARISON:  05/09/2016 FINDINGS: Upper normal size of cardiac silhouette. Atherosclerotic calcification aorta. Mediastinal contours and pulmonary vascularity normal. Small bibasilar pleural effusions. Underlying emphysematous and chronic bronchitic changes consistent with COPD. Questionable RIGHT basilar infiltrate. No pneumothorax. Bones  demineralized. IMPRESSION: COPD changes with small BILATERAL pleural effusions and questionable RIGHT basilar infiltrate. Aortic atherosclerosis. Electronically Signed   By: Ulyses Southward M.D.   On: 07/29/2016 10:19   Ct Chest Wo Contrast  Result Date: 07/29/2016 CLINICAL DATA:  Shortness of breath since January. Status post flu/pneumonia. EXAM: CT CHEST WITHOUT CONTRAST TECHNIQUE: Multidetector CT imaging of the chest was performed following the standard protocol without IV contrast. COMPARISON:  Plain films 07/29/2016.  No prior CT. FINDINGS: Cardiovascular: Aortic and branch vessel atherosclerosis. Aberrant right subclavian artery, traversing posterior to the esophagus. Mild cardiomegaly. Multivessel coronary artery atherosclerosis. Mediastinum/Nodes: Borderline precarinal adenopathy at 10 mm on image 66/series 3. Hilar regions poorly evaluated without intravenous contrast. Lungs/Pleura: Small, right larger than left pleural effusions. Moderate to marked  centrilobular emphysema. Mild biapical pleural-parenchymal scarring. Dependent right base opacity is most consistent with atelectasis and scar. Upper Abdomen: Cholecystectomy. Normal imaged portions of the spleen, stomach, pancreas, adrenal glands. Musculoskeletal: Thoracolumbar spondylosis with S-shaped thoracic spine curvature. IMPRESSION: 1. Moderate-to-marked emphysema. 2. Cardiomegaly with small bilateral pleural effusions. Question fluid overload or mild CHF. 3.  Coronary artery atherosclerosis. Aortic atherosclerosis. 4. Borderline thoracic adenopathy, favored to be reactive. Electronically Signed   By: Jeronimo Greaves M.D.   On: 07/29/2016 12:12      TODAY-DAY OF DISCHARGE:  Subjective:   [name] today has no headache,no chest abdominal pain,no new weakness tingling or numbness, feels much better wants to go home today.   Objective:   Blood pressure 121/72, pulse 66, temperature 98 F (36.7 C), temperature source Oral, resp. rate 16, height  (1.6 m), weight 49.3 kg (108 lb 11.2 oz), SpO2 98 %.  Intake/Output Summary (Last 24 hours) at 08/01/16 1219 Last data filed at 08/01/16 0900  Gross per 24 hour  Intake              240 ml  Output              500 ml  Net             -260 ml   Filed Weights   07/30/16 0611 07/31/16 0508 08/01/16 0459  Weight: 47 kg (103 lb 11.2 oz) 48 kg (105 lb 14.4 oz) 49.3 kg (108 lb 11.2 oz)    Exam: Awake Alert, Oriented *3, No new F.N deficits, Normal affect [place].AT,PERRAL Supple Neck,No JVD, No cervical lymphadenopathy appriciated.  Symmetrical Chest wall movement, Good air movement bilaterally, CTAB RRR,No Gallops,Rubs or new Murmurs, No Parasternal Heave +ve B.Sounds, Abd Soft, Non tender, No organomegaly appriciated, No rebound -guarding or rigidity. No Cyanosis, Clubbing or edema, No new Rash or bruise   PERTINENT RADIOLOGIC STUDIES: Dg Chest 2 View  Result Date: 07/29/2016 CLINICAL DATA:  Shortness of breath today, coughing for  few weeks, dry cough, history atrial fibrillation/flutter, chronic kidney disease, COPD, former smoker EXAM: CHEST  2 VIEW COMPARISON:  05/09/2016 FINDINGS: Upper normal size of cardiac silhouette. Atherosclerotic calcification aorta. Mediastinal contours and pulmonary vascularity normal. Small bibasilar pleural effusions. Underlying emphysematous and chronic bronchitic changes consistent with COPD. Questionable RIGHT basilar infiltrate. No pneumothorax. Bones demineralized. IMPRESSION: COPD changes with small BILATERAL pleural effusions and questionable RIGHT basilar infiltrate. Aortic atherosclerosis. Electronically Signed   By: Ulyses Southward M.D.   On: 07/29/2016 10:19   Ct Chest Wo Contrast  Result Date: 07/29/2016 CLINICAL DATA:  Shortness of breath since January. Status post flu/pneumonia. EXAM: CT CHEST WITHOUT CONTRAST TECHNIQUE: Multidetector CT imaging of the chest was performed following the  standard protocol without IV contrast. COMPARISON:  Plain films 07/29/2016.  No prior CT. FINDINGS: Cardiovascular: Aortic and branch vessel atherosclerosis. Aberrant right subclavian artery, traversing posterior to the esophagus. Mild cardiomegaly. Multivessel coronary artery atherosclerosis. Mediastinum/Nodes: Borderline precarinal adenopathy at 10 mm on image 66/series 3. Hilar regions poorly evaluated without intravenous contrast. Lungs/Pleura: Small, right larger than left pleural effusions. Moderate to marked centrilobular emphysema. Mild biapical pleural-parenchymal scarring. Dependent right base opacity is most consistent with atelectasis and scar. Upper Abdomen: Cholecystectomy. Normal imaged portions of the spleen, stomach, pancreas, adrenal glands. Musculoskeletal: Thoracolumbar spondylosis with S-shaped thoracic spine curvature. IMPRESSION: 1. Moderate-to-marked emphysema. 2. Cardiomegaly with small bilateral pleural effusions. Question fluid overload or mild CHF. 3.  Coronary artery atherosclerosis.  Aortic atherosclerosis. 4. Borderline thoracic adenopathy, favored to be reactive. Electronically Signed   By: Jeronimo Greaves M.D.   On: 07/29/2016 12:12     PERTINENT LAB RESULTS: CBC:  Recent Labs  07/30/16 0557 07/31/16 0410  WBC 8.4 9.1  HGB 15.2* 14.4  HCT 46.1* 43.4  PLT 312 282   CMET CMP     Component Value Date/Time   NA 139 08/01/2016 0400   NA 143 05/15/2016   K 3.5 08/01/2016 0400   CL 103 08/01/2016 0400   CO2 27 08/01/2016 0400   GLUCOSE 91 08/01/2016 0400   BUN 31 (H) 08/01/2016 0400   BUN 42 (A) 05/15/2016   CREATININE 1.96 (H) 08/01/2016 0400   CALCIUM 8.9 08/01/2016 0400   PROT 5.8 (L) 04/15/2016 0609   ALBUMIN 3.0 (L) 04/15/2016 0609   AST 18 05/15/2016   ALT 17 05/15/2016   ALKPHOS 44 05/15/2016   BILITOT 0.9 04/15/2016 0609   GFRNONAA 22 (L) 08/01/2016 0400   GFRAA 25 (L) 08/01/2016 0400    GFR Estimated Creatinine Clearance: 15.7 mL/min (A) (by C-G formula based on SCr of 1.96 mg/dL (H)). No results for input(s): LIPASE, AMYLASE in the last 72 hours. No results for input(s): CKTOTAL, CKMB, CKMBINDEX, TROPONINI in the last 72 hours. Invalid input(s): POCBNP No results for input(s): DDIMER in the last 72 hours. No results for input(s): HGBA1C in the last 72 hours. No results for input(s): CHOL, HDL, LDLCALC, TRIG, CHOLHDL, LDLDIRECT in the last 72 hours. No results for input(s): TSH, T4TOTAL, T3FREE, THYROIDAB in the last 72 hours.  Invalid input(s): FREET3 No results for input(s): VITAMINB12, FOLATE, FERRITIN, TIBC, IRON, RETICCTPCT in the last 72 hours. Coags: No results for input(s): INR in the last 72 hours.  Invalid input(s): PT Microbiology: No results found for this or any previous visit (from the past 240 hour(s)).  FURTHER DISCHARGE INSTRUCTIONS:  Get Medicines reviewed and adjusted: Please take all your medications with you for your next visit with your Primary MD  Laboratory/radiological data: Please request your Primary MD  to go over all hospital tests and procedure/radiological results at the follow up, please ask your Primary MD to get all Hospital records sent to his/her office.  In some cases, they will be blood work, cultures and biopsy results pending at the time of your discharge. Please request that your primary care M.D. goes through all the records of your hospital data and follows up on these results.  Also Note the following: If you experience worsening of your admission symptoms, develop shortness of breath, life threatening emergency, suicidal or homicidal thoughts you must seek medical attention immediately by calling 911 or calling your MD immediately  if symptoms less severe.  You must read complete instructions/literature along  with all the possible adverse reactions/side effects for all the Medicines you take and that have been prescribed to you. Take any new Medicines after you have completely understood and accpet all the possible adverse reactions/side effects.   Do not drive when taking Pain medications or sleeping medications (Benzodaizepines)  Do not take more than prescribed Pain, Sleep and Anxiety Medications. It is not advisable to combine anxiety,sleep and pain medications without talking with your primary care practitioner  Special Instructions: If you have smoked or chewed Tobacco  in the last 2 yrs please stop smoking, stop any regular Alcohol  and or any Recreational drug use.  Wear Seat belts while driving.  Please note: You were cared for by a hospitalist during your hospital stay. Once you are discharged, your primary care physician will handle any further medical issues. Please note that NO REFILLS for any discharge medications will be authorized once you are discharged, as it is imperative that you return to your primary care physician (or establish a relationship with a primary care physician if you do not have one) for your post hospital discharge needs so that they can  reassess your need for medications and monitor your lab values.  Total Time spent coordinating discharge including counseling, education and face to face time equals 45 minutes.  SignedJeoffrey Massed 08/01/2016 12:19 PM

## 2016-08-01 NOTE — Progress Notes (Signed)
Progress Note  Patient Name: [name] Date of Encounter: 08/01/2016  Primary Cardiologist: new- [place]  Subjective   Feels better today. States breathing is OK. No palpitations.  Inpatient Medications    Scheduled Meds: . apixaban  2.5 mg Oral BID  . atorvastatin  5 mg Oral q1800  . diltiazem  240 mg Oral Daily  . feeding supplement (ENSURE ENLIVE)  237 mL Oral BID BM  . furosemide  20 mg Oral Daily  . mometasone-formoterol  2 puff Inhalation BID  . predniSONE  5 mg Oral Q breakfast  . sodium chloride flush  3 mL Intravenous Q12H  . umeclidinium bromide  1 puff Inhalation Daily   Continuous Infusions: . sodium chloride     PRN Meds: sodium chloride, albuterol, ondansetron **OR** ondansetron (ZOFRAN) IV, sodium chloride flush, traMADol-acetaminophen   Vital Signs    Vitals:   07/31/16 2107 08/01/16 0459 08/01/16 0700 08/01/16 0944  BP: 107/70 112/65  121/72  Pulse: 77 66    Resp: 17 16    Temp: 98 F (36.7 C) 98 F (36.7 C)    TempSrc: Oral Oral    SpO2: 100% 100% 98%   Weight:  108 lb 11.2 oz (49.3 kg)    Height:        Intake/Output Summary (Last 24 hours) at 08/01/16 1202 Last data filed at 08/01/16 0900  Gross per 24 hour  Intake              240 ml  Output              500 ml  Net             -260 ml   Filed Weights   07/30/16 0611 07/31/16 0508 08/01/16 0459  Weight: 103 lb 11.2 oz (47 kg) 105 lb 14.4 oz (48 kg) 108 lb 11.2 oz (49.3 kg)    Telemetry    Now in NSR - Personally Reviewed  ECG    None  Physical Exam   GEN: Thin WF No acute distress.   Neck: No JVD Cardiac: IRRR, no murmurs, rubs, or gallops.  Respiratory: Clear to auscultation bilaterally. GI: Soft, nontender, non-distended  MS: No edema; No deformity. Neuro:  Nonfocal  Psych: Normal affect   Labs    Chemistry  Recent Labs Lab 07/30/16 0557 07/31/16 0410 08/01/16 0400  NA 138 139 139  K 3.3* 3.1* 3.5  CL 100* 100* 103  CO2 GLUCOSE 97 104* 91   BUN 21* 31* 31*  CREATININE 1.79* 2.12* 1.96*  CALCIUM 8.7* 9.1 8.9  GFRNONAA 24* 20* 22*  GFRAA 28* 23* 25*  ANIONGAP Hematology  Recent Labs Lab 07/29/16 0941 07/30/16 0557 07/31/16 0410  WBC 9.7 8.4 9.1  RBC 5.21* 4.95 4.62  HGB 16.2* 15.2* 14.4  HCT 48.9* 46.1* 43.4  MCV 93.9 93.1 93.9  MCH 31.1 30.7 31.2  MCHC 33.1 33.0 33.2  RDW 14.3 14.6 14.6  PLT 291 312 282    Cardiac Enzymes  Recent Labs Lab 07/29/16 0941  TROPONINI <0.03     Recent Labs Lab 07/29/16 1026  TROPIPOC 0.01     BNP  Recent Labs Lab 07/29/16 0941  BNP 218.4*     DDimer No results for input(s): DDIMER in the last 168 hours.   Radiology    No results found.  Cardiac Studies   Echo: Study Conclusions  - Left ventricle: The cavity  size was normal. Wall thickness was   normal. Indeterminant diastolic function (atrial flutter).   Systolic function was normal. The estimated ejection fraction was   in the range of 55% to 60%. Wall motion was normal; there were no   regional wall motion abnormalities. - Aortic valve: There was no stenosis. - Mitral valve: There was trivial regurgitation. - Right ventricle: The cavity size was normal. Systolic function   was mildly reduced. - Tricuspid valve: Peak RV-RA gradient (S): 31 mm Hg. - Pulmonary arteries: PA peak pressure: 34 mm Hg (S). - Inferior vena cava: The vessel was normal in size. The   respirophasic diameter changes were in the normal range (>= 50%),   consistent with normal central venous pressure.  Impressions:  - The patient was in atrial flutter. Normal LV size with EF 55-60%.   Normal RV size with mildly decreased systolic function. No   significant valvular abnormalities.   Patient Profile     83 y.o. female with long history of Pafib/flutter, admitted with acute diastolic CHF and COPD exacerbation.   Assessment & Plan    1. Paroxysmal atrial fibrillation/flutter. Rate is generally well  controlled on po Cardizem. She has had this for years. Not a good candidate for AAD therapy due to CKD and COPD. Recommend continued rate control. She has a [place] Vasc score of 5 (age,sex, CHF, PAD). She is on low dose Eliquis which is appropriate for her age, body weight and renal dysfunction. My concern is that she is approaching the point where it may not be safe to continue Eliquis with worsening renal function. She was stage 3 and now stage 4. This is exacerbated by recent diuretics. I think we will need to watch renal function closely and see at what point it stabilizes. Recommend stopping her ASA to minimize her bleeding risk. She has converted to NSR for now.  2. Acute on chronic diastolic CHF. Good response to IV lasix with 6 lbs weight loss.  Clinically much better. Lasix reduced  to 20 mg daily to avoid hypotension and worsening kidney disease. May need to adjust and take extra as needed for weight gain or increased edema. Echo shows normal LV function and no significant valvular disease. Sodium restriction.  3. CKD acute on chronic stage 3>4. Creatinine a little better today.  Lasix dose reduced as above. Potassium repleated.   4. COPD - plan on home oxygen therapy. Recommend she use continually with some RV dysfunction.  OK for DC today from my standpoint. I will arrange follow up in our office.  Signed, Jun Rightmyer [place], MD  08/01/2016, 12:02 PM

## 2016-08-11 ENCOUNTER — Ambulatory Visit (INDEPENDENT_AMBULATORY_CARE_PROVIDER_SITE_OTHER): Payer: Medicare Other | Admitting: Acute Care

## 2016-08-11 ENCOUNTER — Encounter: Payer: Self-pay | Admitting: Acute Care

## 2016-08-11 VITALS — BP 124/68 | HR 102 | Ht 63.0 in | Wt 108.0 lb

## 2016-08-11 DIAGNOSIS — R0603 Acute respiratory distress: Secondary | ICD-10-CM

## 2016-08-11 DIAGNOSIS — J449 Chronic obstructive pulmonary disease, unspecified: Secondary | ICD-10-CM | POA: Diagnosis not present

## 2016-08-11 DIAGNOSIS — I779 Disorder of arteries and arterioles, unspecified: Secondary | ICD-10-CM

## 2016-08-11 DIAGNOSIS — J9621 Acute and chronic respiratory failure with hypoxia: Secondary | ICD-10-CM | POA: Diagnosis not present

## 2016-08-11 NOTE — Progress Notes (Signed)
I have reviewed and agree with assessment/plan.  Coralyn HellingVineet Mahli Glahn, MD [facility] 08/11/2016, 5:09 PM Pager:  [phone]

## 2016-08-11 NOTE — Assessment & Plan Note (Signed)
Acute on chronic respiratory failure secondary to acute on chronic congestive heart failure Resolved after hospitalization Plan Remember your goal oxygen saturation is 88-92% Continue using your Breo once daily. Rinse mouth after use. Continue using you albuterol as needed for shortness of breath or wheezing. Follow up with Rubye Oaksammy Parrett NP in June as scheduled. If you feel you don't need this appointment call and cancel. If you cancel this appointment please have them schedule you with Dr. Craige CottaSood in 6 months. Please contact office for sooner follow up if symptoms do not improve or worsen or seek emergency care

## 2016-08-11 NOTE — Patient Instructions (Addendum)
It is nice to meet you today. Remember your goal oxygen saturation is 88-92% Continue using your Breo once daily. Rinse mouth after use. Continue using you albuterol as needed for shortness of breath or wheezing. Follow up with Rubye Oaksammy Parrett NP in June as scheduled. If you feel you don't need this appointment call and cancel. If you cancel this appointment please have them schedule you with Dr. Craige CottaSood in 6 months. Please contact office for sooner follow up if symptoms do not improve or worsen or seek emergency care

## 2016-08-11 NOTE — Progress Notes (Signed)
History of Present Illness [name] is a 83 y.o. female former smoker  With COPD. She is followed by Dr.Sood.    08/11/2016 Hospital Follow Up: Pt. Presents for follow up of hospitalization from 4/24-4/27/2018. She was admitted for acute on chronic respiratory failure  2/2 acute decompensated heart failure with underlying COPD. She improved with Diuresis, Scheduled BD's, IV steroids and oxygen therapy. She presents today for follow up.She states she is better. She is compliant with her prednisone taper. She states she has one does remaining.She is compliant with her Breo once daily  and Albuterol inhaler which she uses about twice daily.She denies coughing up any secretions. She states she feels she is back to her baseline.She does have some dyspnea with exertion. She is wearing her oxygen at 2 L [place]. She states she wears this most of the time. She denies chest pain, fever, orthopnea, or hemoptysis. She denies any leg pain or swelling. She states she is keeping up with her weight daily for her heart failure routine.  Test Results:   Pulmonary tests Spirometry 04/17/16 >> FEV1 1.15 (70%), FEV1% 60 Ambulatory oximetry 07/18/16 >> 82% after 2 laps on room air  Cardiac tests Echo 04/15/16 >> EF 55%, PAS 33 mmHg  Past medical history A flutter, CKD 3, Glaucoma, HLD, PMR   CBC Latest Ref Rng & Units 07/31/2016 07/30/2016 07/29/2016  WBC 4.0 - 10.5 K/uL 9.1 8.4 9.7  Hemoglobin 12.0 - 15.0 g/dL 19.114.4 15.2(H) 16.2(H)  Hematocrit 36.0 - 46.0 % 43.4 46.1(H) 48.9(H)  Platelets 150 - 400 K/uL 282 312 291    BMP Latest Ref Rng & Units 08/01/2016 07/31/2016 07/30/2016  Glucose 65 - 99 mg/dL 91 478(G104(H) 97  BUN 6 - 20 mg/dL 95(A31(H) 21(H31(H) 08(M21(H)  Creatinine 0.44 - 1.00 mg/dL 5.78(I1.96(H) 6.96(E2.12(H) 9.52(W1.79(H)  Sodium 135 - 145 mmol/L 139 139 138  Potassium 3.5 - 5.1 mmol/L 3.5 3.1(L) 3.3(L)  Chloride 101 - 111 mmol/L 103 100(L) 100(L)  CO2 22 - 32 mmol/L 27 26 26   Calcium 8.9 - 10.3 mg/dL 8.9 9.1 4.1(L8.7(L)     BNP    Component Value Date/Time   BNP 218.4 (H) 07/29/2016 0941     PFT    Component Value Date/Time   FEV1PRE 1.15 04/17/2016 0000   FVCPRE 1.93 04/17/2016 0000   PREFEV1FVCRT 60 04/17/2016 0000    Dg Chest 2 View  Result Date: 07/29/2016 CLINICAL DATA:  Shortness of breath today, coughing for few weeks, dry cough, history atrial fibrillation/flutter, chronic kidney disease, COPD, former smoker EXAM: CHEST  2 VIEW COMPARISON:  05/09/2016 FINDINGS: Upper normal size of cardiac silhouette. Atherosclerotic calcification aorta. Mediastinal contours and pulmonary vascularity normal. Small bibasilar pleural effusions. Underlying emphysematous and chronic bronchitic changes consistent with COPD. Questionable RIGHT basilar infiltrate. No pneumothorax. Bones demineralized. IMPRESSION: COPD changes with small BILATERAL pleural effusions and questionable RIGHT basilar infiltrate. Aortic atherosclerosis. Electronically Signed   By: Ulyses SouthwardMark  Boles M.D.   On: 07/29/2016 10:19   Ct Chest Wo Contrast  Result Date: 07/29/2016 CLINICAL DATA:  Shortness of breath since January. Status post flu/pneumonia. EXAM: CT CHEST WITHOUT CONTRAST TECHNIQUE: Multidetector CT imaging of the chest was performed following the standard protocol without IV contrast. COMPARISON:  Plain films 07/29/2016.  No prior CT. FINDINGS: Cardiovascular: Aortic and branch vessel atherosclerosis. Aberrant right subclavian artery, traversing posterior to the esophagus. Mild cardiomegaly. Multivessel coronary artery atherosclerosis. Mediastinum/Nodes: Borderline precarinal adenopathy at 10 mm on image 66/series 3. Hilar regions poorly evaluated without  intravenous contrast. Lungs/Pleura: Small, right larger than left pleural effusions. Moderate to marked centrilobular emphysema. Mild biapical pleural-parenchymal scarring. Dependent right base opacity is most consistent with atelectasis and scar. Upper Abdomen: Cholecystectomy. Normal imaged  portions of the spleen, stomach, pancreas, adrenal glands. Musculoskeletal: Thoracolumbar spondylosis with S-shaped thoracic spine curvature. IMPRESSION: 1. Moderate-to-marked emphysema. 2. Cardiomegaly with small bilateral pleural effusions. Question fluid overload or mild CHF. 3.  Coronary artery atherosclerosis. Aortic atherosclerosis. 4. Borderline thoracic adenopathy, favored to be reactive. Electronically Signed   By: Jeronimo Greaves M.D.   On: 07/29/2016 12:12     Past medical hx Past Medical History:  Diagnosis Date  . Anticoagulated    Eliquis  . Atrial flutter (HCC)    NO CARDIOLOGIST FOLLOWED BY DR Tori Milks  . Bloody stools    LAST 4 DAYS  . CHF (congestive heart failure) (HCC)   . Chronic back pain   . CKD (chronic kidney disease), stage III    Creatinine 1.35 -1.55  . COPD (chronic obstructive pulmonary disease) (HCC)   . Diverticulosis   . Dyspnea   . Dysrhythmia   . Edema   . Gait disorder   . Glaucoma    Severe  . Glaucoma    both eyes  . High cholesterol   . Multinodular goiter   . PAOD (peripheral arterial occlusive disease) (HCC)    Stein in right groin, decrease pulses  . PMR (polymyalgia rheumatica) (HCC)   . Pneumonia, organism unspecified(486) 04/18/2016  . Protein-calorie malnutrition (HCC)   . Rash of back   . Weakness      Social History  Substance Use Topics  . Smoking status: Former Smoker    Types: Cigarettes    Quit date: 07/12/1993  . Smokeless tobacco: Never Used  . Alcohol use No    Tobacco Cessation: Patient is a previous smoker. She quit in 1995.  Past surgical hx, Family hx, Social hx all reviewed.  Current Outpatient Prescriptions on File Prior to Visit  Medication Sig  . acetaminophen (TYLENOL) 500 MG tablet Take 500 mg by mouth every 4 (four) hours as needed for mild pain.   Marland Kitchen albuterol (PROAIR HFA) 108 (90 Base) MCG/ACT inhaler Inhale 2 puffs into the lungs every 6 (six) hours as needed for wheezing or shortness of breath.    Marland Kitchen atorvastatin (LIPITOR) 10 MG tablet Take 5 mg by mouth daily.   . cholecalciferol (VITAMIN D) 1000 UNITS tablet Take 3,000 Units by mouth daily.  Marland Kitchen diltiazem (CARDIZEM) 120 MG tablet Take 240 mg by mouth daily.   Marland Kitchen ELIQUIS 2.5 MG TABS tablet Take 2.5 mg by mouth 2 (two) times daily.  . feeding supplement, ENSURE ENLIVE, (ENSURE ENLIVE) LIQD Take 237 mLs by mouth 2 (two) times daily between meals.  . Fluticasone-Salmeterol (ADVAIR) 250-50 MCG/DOSE AEPB Inhale 1 puff into the lungs 2 (two) times daily.  . furosemide (LASIX) 20 MG tablet Take 1 tablet (20 mg total) by mouth daily.  . OXYGEN Inhale 2 L into the lungs daily.  . potassium chloride 20 MEQ TBCR Take 20 mEq by mouth daily.  . predniSONE (DELTASONE) 10 MG tablet Take 5 mg by mouth daily with breakfast.   . traMADol-acetaminophen (ULTRACET) 37.5-325 MG per tablet Take 1-2 tablets by mouth 2 (two) times daily as needed for moderate pain.   . vitamin E 1000 UNIT capsule Take 2,000 Units by mouth daily.   No current facility-administered medications on file prior to visit.  Allergies  Allergen Reactions  . Codeine Sulfate Nausea And Vomiting and Other (See Comments)    headache    Review Of Systems:  Constitutional:   No  weight loss, night sweats,  Fevers, chills, fatigue, or  lassitude.  HEENT:   No headaches,  Difficulty swallowing,  Tooth/dental problems, or  Sore throat,                No sneezing, itching, ear ache, nasal congestion, post nasal drip,   CV:  No chest pain,  Orthopnea, PND, swelling in lower extremities, anasarca, dizziness, palpitations, syncope.   GI  No heartburn, indigestion, abdominal pain, nausea, vomiting, diarrhea, change in bowel habits, loss of appetite, bloody stools.   Resp: No shortness of breath with exertion or at rest.  No excess mucus, no productive cough,  No non-productive cough,  No coughing up of blood.  No change in color of mucus.  No wheezing.  No chest wall deformity  Skin:  no rash or lesions.  GU: no dysuria, change in color of urine, no urgency or frequency.  No flank pain, no hematuria   MS:  No joint pain or swelling.  No decreased range of motion.  No back pain.  Psych:  No change in mood or affect. No depression or anxiety.  No memory loss.   Vital Signs BP 124/68 (BP Location: Left Arm, Cuff Size: Normal)   Pulse (!) 102   Ht 5\' 3"  (1.6 m)   Wt 108 lb (49 kg)   SpO2 (!) 89%   BMI 19.13 kg/m    Physical Exam:  General- No distress,  A&Ox3, pleasant ENT: No sinus tenderness, TM clear, pale nasal mucosa, no oral exudate,no post nasal drip, no LAN Cardiac: S1, S2, regular rate and rhythm, no murmur Chest: No wheeze/ rales/ dullness; no accessory muscle use, no nasal flaring, no sternal retractions Abd.: Soft Non-tender, flat, nondistended Ext: No clubbing cyanosis, edema Neuro:  normal strength, slightly deconditioned due to hospitalization Skin: No rashes, warm and dry Psych: normal mood and behavior   Assessment/Plan  Acute respiratory distress Acute on chronic respiratory failure secondary to acute on chronic congestive heart failure Resolved after hospitalization Plan Remember your goal oxygen saturation is 88-92% Continue using your Breo once daily. Rinse mouth after use. Continue using you albuterol as needed for shortness of breath or wheezing. Follow up with Rubye Oaks NP in June as scheduled. If you feel you don't need this appointment call and cancel. If you cancel this appointment please have them schedule you with Dr. Craige Cotta in 6 months. Please contact office for sooner follow up if symptoms do not improve or worsen or seek emergency care      COPD (chronic obstructive pulmonary disease) (HCC) Acute on chronic respiratory failure secondary to acute on chronic heart failure with underlying COPD Resolved after hospitalization Plan Remember your goal oxygen saturation is 88-92% Continue using your Breo once  daily. Rinse mouth after use. Continue using you albuterol as needed for shortness of breath or wheezing. Follow up with Rubye Oaks NP in June as scheduled. If you feel you don't need this appointment call and cancel. If you cancel this appointment please have them schedule you with Dr. Craige Cotta in 6 months. Please contact office for sooner follow up if symptoms do not improve or worsen or seek emergency care        Bevelyn Ngo, NP 08/11/2016  3:26 PM

## 2016-08-11 NOTE — Assessment & Plan Note (Signed)
Acute on chronic respiratory failure secondary to acute on chronic heart failure with underlying COPD Resolved after hospitalization Plan Remember your goal oxygen saturation is 88-92% Continue using your Breo once daily. Rinse mouth after use. Continue using you albuterol as needed for shortness of breath or wheezing. Follow up with Shelby Oaksammy Parrett NP in June as scheduled. If you feel you don't need this appointment call and cancel. If you cancel this appointment please have them schedule you with Dr. Craige CottaSood in 6 months. Please contact office for sooner follow up if symptoms do not improve or worsen or seek emergency care

## 2016-08-27 NOTE — Progress Notes (Signed)
Cardiology Office Note   Date:  08/28/2016   ID:  Shelby FireDawn L Risko, DOB 04-08-1927, MRN [id number]  PCP:  Nila NephewGreen, Edwin, MD  Cardiologist:   Latanja Lehenbauer Swazil[place]Jordan, MD   Chief Complaint  Patient presents with  . Shortness of Breath    pt states some SOB       History of Present Illness: [name] is a [age] y.o. female who presents for post hospital follow up of atrial fibrillation. She has a long history of paroxysmal atrial fibrillation/flutter managed with rate control with Cardizem. She is on Eliquis.  Did take Xarelto in the past but had some increased bleeding on this. Has tolerated Eliquis well without bleeding. She has a history of CKD, PAD (50% carotid disease) and diastolic CHF. She also has COPD.  She was [facility] with PNA and the flu. Was initially in NSR but went into AFib in hospital. Rate controlled. She was discharged to SNF for one month. Required diuretics for edema but was discharged home on no diuretic. She was readmitted in April with increased weakness and SOB. She was in atrial flutter with some increased VR. Rate controlled with Cardizem. She did convert to NSR. She was treated with IV lasix with some exacerbation of her CKD. Maintained on Eliquis 2.5 mg bid. ASA stopped. Remained on home oxygen. Not felt to be a candidate for AAD therapy due to severe COPD and CKD. Echo showed normal LV and valvular function. She was also treated with steroids and nebulizers. Discharged on 08/01/16. Seen in follow up with pulmonary on 08/11/16.   On follow up today she states her breathing is about the same. Tapering off steroids. No chest pain. Minimally aware of irregular heart beat. No swelling. No cough. Getting ready to [place] [facility] [place].    Past Medical History:  Diagnosis Date  . Anticoagulated    Eliquis  . Atrial flutter (HCC)    NO CARDIOLOGIST FOLLOWED BY DR Tori MilksEDWIN GREENE  . Bloody stools    LAST 4 DAYS  . CHF (congestive heart failure) (HCC)     . Chronic back pain   . CKD (chronic kidney disease), stage III    Creatinine 1.35 -1.55  . COPD (chronic obstructive pulmonary disease) (HCC)   . Diverticulosis   . Dyspnea   . Dysrhythmia   . Edema   . Gait disorder   . Glaucoma    Severe  . Glaucoma    both eyes  . High cholesterol   . Multinodular goiter   . PAOD (peripheral arterial occlusive disease) (HCC)    Stein in right groin, decrease pulses  . PMR (polymyalgia rheumatica) (HCC)   . Pneumonia, organism unspecified(486) 04/18/2016  . Protein-calorie malnutrition (HCC)   . Rash of back   . Weakness     Past Surgical History:  Procedure Laterality Date  . COLONOSCOPY N/A 07/15/2013   Procedure: COLONOSCOPY;  Surgeon: Louis Meckelobert D Kaplan, MD;  Location: [facility] ENDOSCOPY;  Service: Endoscopy;  Laterality: N/A;  . ESOPHAGOGASTRODUODENOSCOPY N/A 07/08/2013   Procedure: ESOPHAGOGASTRODUODENOSCOPY (EGD);  Surgeon: Louis Meckelobert D Kaplan, MD;  [facility];  Service: Endoscopy;  Laterality: N/A;  Per Connye BurkittSuzi Brewer, will try to have CRNA available, but will not guaranteee. Ok to schedule per Clydie BraunKaren 07/06/13  . GALLBLADDER SURGERY    . IR GENERIC HISTORICAL  05/18/2014   IR RADIOLOGIST EVAL & MGMT 05/18/2014 Berdine DanceMichael Shick, MD [facility] INTERV RAD  . LAMINECTOMY    .  leg stent Right   . SHOULDER SURGERY     frozen shoulder/ Bil shoulders     Current Outpatient Prescriptions  Medication Sig Dispense Refill  . acetaminophen (TYLENOL) 500 MG tablet Take 500 mg by mouth every 4 (four) hours as needed for mild pain.     Marland Kitchen albuterol (PROAIR HFA) 108 (90 Base) MCG/ACT inhaler Inhale 2 puffs into the lungs every 6 (six) hours as needed for wheezing or shortness of breath. 1 Inhaler 3  . atorvastatin (LIPITOR) 10 MG tablet Take 5 mg by mouth daily.     . cholecalciferol (VITAMIN D) 1000 UNITS tablet Take 3,000 Units by mouth daily.    Marland Kitchen diltiazem (CARDIZEM) 120 MG tablet Take 240 mg by mouth daily.     Marland Kitchen ELIQUIS 2.5 MG TABS tablet Take 2.5 mg by  mouth 2 (two) times daily.    . feeding supplement, ENSURE ENLIVE, (ENSURE ENLIVE) LIQD Take 237 mLs by mouth 2 (two) times daily between meals. 60 Bottle 0  . Fluticasone-Salmeterol (ADVAIR) 250-50 MCG/DOSE AEPB Inhale 1 puff into the lungs 2 (two) times daily.    . furosemide (LASIX) 20 MG tablet Take 1 tablet (20 mg total) by mouth daily. 30 tablet 0  . OXYGEN Inhale 2 L into the lungs daily.    . potassium chloride 20 MEQ TBCR Take 20 mEq by mouth daily. 30 tablet 0  . predniSONE (DELTASONE) 10 MG tablet Take 5 mg by mouth daily with breakfast.     . traMADol-acetaminophen (ULTRACET) 37.5-325 MG per tablet Take 1-2 tablets by mouth 2 (two) times daily as needed for moderate pain.     . vitamin E 1000 UNIT capsule Take 2,000 Units by mouth daily.     No current facility-administered medications for this visit.     Allergies:   Codeine sulfate    Social History:  The patient  reports that she quit smoking about 23 years ago. Her smoking use included Cigarettes. She has never used smokeless tobacco. She reports that she does not drink alcohol or use drugs.   Family History:  The patient's family history includes Cirrhosis in her maternal aunt; Diabetes in her maternal uncle; Throat cancer in her maternal grandfather and mother.    ROS:  Please see the history of present illness.   Otherwise, review of systems are positive for none.   All other systems are reviewed and negative.    PHYSICAL EXAM: VS:  BP 100/70   Pulse 94   Ht 5\' 3"  (1.6 m)   Wt 108 lb 12.8 oz (49.4 kg)   SpO2 (!) 89%   BMI 19.27 kg/m  , BMI Body mass index is 19.27 kg/m. GEN: thin, well developed, in no acute distress  HEENT: normal  Neck: no JVD, carotid bruits, or masses Cardiac: IRRR; normal S1-2 no murmurs, rubs, or gallops,no edema  Respiratory:  clear to auscultation bilaterally, normal work of breathing GI: soft, nontender, nondistended, + BS MS: no deformity or atrophy  Skin: warm and dry, no  rash Neuro:  Strength and sensation are intact Psych: euthymic mood, full affect   EKG:  EKG is not ordered today. The ekg ordered today demonstrates N/A   Recent Labs: 05/15/2016: ALT 17; TSH 3.58 07/29/2016: B Natriuretic Peptide 218.4 07/31/2016: Hemoglobin 14.4; Platelets 282 08/01/2016: BUN 31; Creatinine, Ser 1.96; Magnesium 2.2; Potassium 3.5; Sodium 139    Lipid Panel No results found for: CHOL, TRIG, HDL, CHOLHDL, VLDL, LDLCALC, LDLDIRECT    Wt Readings  from Last 3 Encounters:  08/28/16 108 lb 12.8 oz (49.4 kg)  08/11/16 108 lb (49 kg)  08/01/16 108 lb 11.2 oz (49.3 kg)      Other studies Reviewed: Echo: 07/30/16: Study Conclusions  - Left ventricle: The cavity size was normal. Wall thickness was   normal. Indeterminant diastolic function (atrial flutter).   Systolic function was normal. The estimated ejection fraction was   in the range of 55% to 60%. Wall motion was normal; there were no   regional wall motion abnormalities. - Aortic valve: There was no stenosis. - Mitral valve: There was trivial regurgitation. - Right ventricle: The cavity size was normal. Systolic function   was mildly reduced. - Tricuspid valve: Peak RV-RA gradient (S): 31 mm Hg. - Pulmonary arteries: PA peak pressure: 34 mm Hg (S). - Inferior vena cava: The vessel was normal in size. The   respirophasic diameter changes were in the normal range (>= 50%),   consistent with normal central venous pressure.  Impressions:  - The patient was in atrial flutter. Normal LV size with EF 55-60%.   Normal RV size with mildly decreased systolic function. No   significant valvular abnormalities.   ASSESSMENT AND PLAN:  1.  Atrial fibrillation/flutter. Pulse is irregular today but rate is well controlled. Not symptomatic. Continue Cardizem and Eliquis. Will recheck renal panel today. 2. Chronic diastolic CHF- appears well compensated today. Continue lasix 20 mg daily. Sodium restriction 3. CKD  stage 3.  4. Severe COPD on home oxygen.    Current medicines are reviewed at length with the patient today.  The patient does not have concerns regarding medicines.  The following changes have been made:  no change  Labs/ tests ordered today include: BMET No orders of the defined types were placed in this encounter.    Disposition:   FU with me in 6 months  Signed, Kearstyn Avitia [place], MD  08/28/2016 1:59 PM    [facility] [address] [place], [place], [address] Phone [phone], Fax [phone]

## 2016-08-28 ENCOUNTER — Encounter: Payer: Self-pay | Admitting: Cardiology

## 2016-08-28 ENCOUNTER — Ambulatory Visit (INDEPENDENT_AMBULATORY_CARE_PROVIDER_SITE_OTHER): Payer: Medicare Other | Admitting: Cardiology

## 2016-08-28 VITALS — BP 100/70 | HR 94 | Ht 63.0 in | Wt 108.8 lb

## 2016-08-28 DIAGNOSIS — I480 Paroxysmal atrial fibrillation: Secondary | ICD-10-CM | POA: Diagnosis not present

## 2016-08-28 DIAGNOSIS — I483 Typical atrial flutter: Secondary | ICD-10-CM | POA: Diagnosis not present

## 2016-08-28 DIAGNOSIS — I5032 Chronic diastolic (congestive) heart failure: Secondary | ICD-10-CM

## 2016-08-28 DIAGNOSIS — N183 Chronic kidney disease, stage 3 unspecified: Secondary | ICD-10-CM

## 2016-08-28 DIAGNOSIS — I779 Disorder of arteries and arterioles, unspecified: Secondary | ICD-10-CM | POA: Diagnosis not present

## 2016-08-28 NOTE — Patient Instructions (Signed)
We will check a renal panel today  Continue your current therapy  I will see you in 6 months

## 2016-08-29 LAB — BASIC METABOLIC PANEL
BUN/Creatinine Ratio: 14 (ref 12–28)
BUN: 24 mg/dL (ref 8–27)
CALCIUM: 9.7 mg/dL (ref 8.7–10.3)
CHLORIDE: 105 mmol/L (ref 96–106)
CO2: 18 mmol/L (ref 18–29)
Creatinine, Ser: 1.76 mg/dL — ABNORMAL HIGH (ref 0.57–1.00)
GFR calc Af Amer: 30 mL/min/{1.73_m2} — ABNORMAL LOW (ref >59)
GFR, EST NON AFRICAN AMERICAN: 26 mL/min/{1.73_m2} — AB (ref >59)
Glucose: 77 mg/dL (ref 65–99)
POTASSIUM: 4.5 mmol/L (ref 3.5–5.2)
Sodium: 143 mmol/L (ref 134–144)

## 2016-09-05 ENCOUNTER — Inpatient Hospital Stay (HOSPITAL_COMMUNITY)
Admission: EM | Admit: 2016-09-05 | Discharge: 2016-09-08 | DRG: 193 | Disposition: A | Discharge status: Home Health | Payer: Medicare Other | Attending: Family Medicine | Admitting: Family Medicine

## 2016-09-05 ENCOUNTER — Encounter (HOSPITAL_COMMUNITY): Payer: Self-pay | Admitting: *Deleted

## 2016-09-05 ENCOUNTER — Emergency Department (HOSPITAL_COMMUNITY): Payer: Medicare Other

## 2016-09-05 DIAGNOSIS — H409 Unspecified glaucoma: Secondary | ICD-10-CM | POA: Diagnosis present

## 2016-09-05 DIAGNOSIS — Z66 Do not resuscitate: Secondary | ICD-10-CM | POA: Diagnosis present

## 2016-09-05 DIAGNOSIS — J441 Chronic obstructive pulmonary disease with (acute) exacerbation: Secondary | ICD-10-CM | POA: Diagnosis present

## 2016-09-05 DIAGNOSIS — N179 Acute kidney failure, unspecified: Secondary | ICD-10-CM | POA: Diagnosis present

## 2016-09-05 DIAGNOSIS — J039 Acute tonsillitis, unspecified: Secondary | ICD-10-CM | POA: Diagnosis present

## 2016-09-05 DIAGNOSIS — Z79899 Other long term (current) drug therapy: Secondary | ICD-10-CM

## 2016-09-05 DIAGNOSIS — I4892 Unspecified atrial flutter: Secondary | ICD-10-CM | POA: Diagnosis present

## 2016-09-05 DIAGNOSIS — I482 Chronic atrial fibrillation: Secondary | ICD-10-CM | POA: Diagnosis not present

## 2016-09-05 DIAGNOSIS — J181 Lobar pneumonia, unspecified organism: Secondary | ICD-10-CM | POA: Diagnosis not present

## 2016-09-05 DIAGNOSIS — I5032 Chronic diastolic (congestive) heart failure: Secondary | ICD-10-CM | POA: Diagnosis present

## 2016-09-05 DIAGNOSIS — I483 Typical atrial flutter: Secondary | ICD-10-CM

## 2016-09-05 DIAGNOSIS — E7800 Pure hypercholesterolemia, unspecified: Secondary | ICD-10-CM | POA: Diagnosis present

## 2016-09-05 DIAGNOSIS — R000 Tachycardia, unspecified: Secondary | ICD-10-CM | POA: Diagnosis present

## 2016-09-05 DIAGNOSIS — Z7901 Long term (current) use of anticoagulants: Secondary | ICD-10-CM

## 2016-09-05 DIAGNOSIS — J440 Chronic obstructive pulmonary disease with acute lower respiratory infection: Secondary | ICD-10-CM | POA: Diagnosis present

## 2016-09-05 DIAGNOSIS — Z9981 Dependence on supplemental oxygen: Secondary | ICD-10-CM

## 2016-09-05 DIAGNOSIS — N183 Chronic kidney disease, stage 3 (moderate): Secondary | ICD-10-CM | POA: Diagnosis present

## 2016-09-05 DIAGNOSIS — I739 Peripheral vascular disease, unspecified: Secondary | ICD-10-CM | POA: Diagnosis present

## 2016-09-05 DIAGNOSIS — J9621 Acute and chronic respiratory failure with hypoxia: Secondary | ICD-10-CM | POA: Diagnosis present

## 2016-09-05 DIAGNOSIS — I480 Paroxysmal atrial fibrillation: Secondary | ICD-10-CM | POA: Diagnosis present

## 2016-09-05 DIAGNOSIS — I5033 Acute on chronic diastolic (congestive) heart failure: Secondary | ICD-10-CM

## 2016-09-05 DIAGNOSIS — R0902 Hypoxemia: Secondary | ICD-10-CM

## 2016-09-05 DIAGNOSIS — J189 Pneumonia, unspecified organism: Principal | ICD-10-CM | POA: Diagnosis present

## 2016-09-05 LAB — CBC
HCT: 42.5 % (ref 36.0–46.0)
Hemoglobin: 14.1 g/dL (ref 12.0–15.0)
MCH: 30.5 pg (ref 26.0–34.0)
MCHC: 33.2 g/dL (ref 30.0–36.0)
MCV: 91.8 fL (ref 78.0–100.0)
PLATELETS: 276 10*3/uL (ref 150–400)
RBC: 4.63 MIL/uL (ref 3.87–5.11)
RDW: 14 % (ref 11.5–15.5)
WBC: 14.9 10*3/uL — AB (ref 4.0–10.5)

## 2016-09-05 LAB — BASIC METABOLIC PANEL
Anion gap: 9 (ref 5–15)
BUN: 37 mg/dL — AB (ref 6–20)
CALCIUM: 8.9 mg/dL (ref 8.9–10.3)
CHLORIDE: 104 mmol/L (ref 101–111)
CO2: 25 mmol/L (ref 22–32)
CREATININE: 1.74 mg/dL — AB (ref 0.44–1.00)
GFR calc Af Amer: 29 mL/min — ABNORMAL LOW (ref >60)
GFR calc non Af Amer: 25 mL/min — ABNORMAL LOW (ref >60)
Glucose, Bld: 108 mg/dL — ABNORMAL HIGH (ref 65–99)
Potassium: 4 mmol/L (ref 3.5–5.1)
SODIUM: 138 mmol/L (ref 135–145)

## 2016-09-05 LAB — PROCALCITONIN: Procalcitonin: 0.26 ng/mL

## 2016-09-05 LAB — TROPONIN I
Troponin I: <0.03 ng/mL (ref <0.03)
Troponin I: <0.03 ng/mL (ref <0.03)

## 2016-09-05 LAB — BRAIN NATRIURETIC PEPTIDE: B Natriuretic Peptide: 416.5 pg/mL — ABNORMAL HIGH (ref 0.0–100.0)

## 2016-09-05 MED ORDER — ACETAMINOPHEN 325 MG PO TABS
650.0000 mg | ORAL_TABLET | ORAL | Status: DC | PRN
  Administered 2016-09-07: 650 mg via ORAL
  Filled 2016-09-05: qty 2

## 2016-09-05 MED ORDER — SODIUM CHLORIDE 0.9% FLUSH
3.0000 mL | Freq: Two times a day (BID) | INTRAVENOUS | Status: DC
  Administered 2016-09-07 – 2016-09-08 (×2): 3 mL via INTRAVENOUS

## 2016-09-05 MED ORDER — LATANOPROST 0.005 % OP SOLN
1.0000 [drp] | Freq: Every day | OPHTHALMIC | Status: DC
  Administered 2016-09-05 – 2016-09-07 (×3): 1 [drp] via OPHTHALMIC
  Filled 2016-09-05: qty 2.5

## 2016-09-05 MED ORDER — PREDNISONE 20 MG PO TABS
60.0000 mg | ORAL_TABLET | Freq: Once | ORAL | Status: AC
  Administered 2016-09-05: 60 mg via ORAL
  Filled 2016-09-05: qty 3

## 2016-09-05 MED ORDER — ALBUTEROL SULFATE (2.5 MG/3ML) 0.083% IN NEBU
5.0000 mg | INHALATION_SOLUTION | Freq: Once | RESPIRATORY_TRACT | Status: AC
  Administered 2016-09-05: 5 mg via RESPIRATORY_TRACT
  Filled 2016-09-05: qty 6

## 2016-09-05 MED ORDER — POTASSIUM CHLORIDE CRYS ER 10 MEQ PO TBCR
10.0000 meq | EXTENDED_RELEASE_TABLET | Freq: Two times a day (BID) | ORAL | Status: DC
  Administered 2016-09-05 – 2016-09-08 (×6): 10 meq via ORAL
  Filled 2016-09-05 (×6): qty 1

## 2016-09-05 MED ORDER — IPRATROPIUM-ALBUTEROL 0.5-2.5 (3) MG/3ML IN SOLN
3.0000 mL | Freq: Once | RESPIRATORY_TRACT | Status: AC
  Administered 2016-09-05: 3 mL via RESPIRATORY_TRACT
  Filled 2016-09-05: qty 3

## 2016-09-05 MED ORDER — DILTIAZEM HCL 90 MG PO TABS
240.0000 mg | ORAL_TABLET | Freq: Every day | ORAL | Status: DC
  Administered 2016-09-05 – 2016-09-08 (×4): 240 mg via ORAL
  Filled 2016-09-05 (×4): qty 2

## 2016-09-05 MED ORDER — ATORVASTATIN CALCIUM 10 MG PO TABS
5.0000 mg | ORAL_TABLET | Freq: Every day | ORAL | Status: DC
  Administered 2016-09-06 – 2016-09-08 (×3): 5 mg via ORAL
  Filled 2016-09-05 (×3): qty 1

## 2016-09-05 MED ORDER — ENSURE ENLIVE PO LIQD
237.0000 mL | Freq: Two times a day (BID) | ORAL | Status: DC
  Administered 2016-09-06 – 2016-09-08 (×5): 237 mL via ORAL

## 2016-09-05 MED ORDER — MENTHOL 3 MG MT LOZG
1.0000 | LOZENGE | OROMUCOSAL | Status: DC | PRN
  Filled 2016-09-05: qty 9

## 2016-09-05 MED ORDER — DEXTROSE 5 % IV SOLN
1.0000 g | Freq: Once | INTRAVENOUS | Status: AC
  Administered 2016-09-05: 1 g via INTRAVENOUS
  Filled 2016-09-05: qty 10

## 2016-09-05 MED ORDER — FLUTICASONE FUROATE-VILANTEROL 200-25 MCG/INH IN AEPB
1.0000 | INHALATION_SPRAY | Freq: Every day | RESPIRATORY_TRACT | Status: DC
  Administered 2016-09-06 – 2016-09-08 (×3): 1 via RESPIRATORY_TRACT
  Filled 2016-09-05: qty 28

## 2016-09-05 MED ORDER — SODIUM CHLORIDE 0.9 % IV SOLN: 250.0000 mL | INTRAVENOUS | Status: DC | PRN

## 2016-09-05 MED ORDER — ALBUTEROL SULFATE (2.5 MG/3ML) 0.083% IN NEBU: 5.0000 mg | INHALATION_SOLUTION | Freq: Once | RESPIRATORY_TRACT | Status: DC

## 2016-09-05 MED ORDER — APIXABAN 2.5 MG PO TABS
2.5000 mg | ORAL_TABLET | Freq: Two times a day (BID) | ORAL | Status: DC
  Administered 2016-09-05 – 2016-09-08 (×6): 2.5 mg via ORAL
  Filled 2016-09-05 (×6): qty 1

## 2016-09-05 MED ORDER — ACETAMINOPHEN 500 MG PO TABS: 500.0000 mg | ORAL_TABLET | ORAL | Status: DC | PRN

## 2016-09-05 MED ORDER — PREDNISONE 20 MG PO TABS
40.0000 mg | ORAL_TABLET | Freq: Every day | ORAL | Status: DC
  Administered 2016-09-06 – 2016-09-08 (×3): 40 mg via ORAL
  Filled 2016-09-05 (×3): qty 2

## 2016-09-05 MED ORDER — SODIUM CHLORIDE 0.9% FLUSH: 3.0000 mL | INTRAVENOUS | Status: DC | PRN

## 2016-09-05 MED ORDER — ONDANSETRON HCL 4 MG/2ML IJ SOLN: 4.0000 mg | Freq: Four times a day (QID) | INTRAMUSCULAR | Status: DC | PRN

## 2016-09-05 MED ORDER — TRAMADOL-ACETAMINOPHEN 37.5-325 MG PO TABS: 1.0000 | ORAL_TABLET | Freq: Two times a day (BID) | ORAL | Status: DC | PRN

## 2016-09-05 MED ORDER — IPRATROPIUM-ALBUTEROL 0.5-2.5 (3) MG/3ML IN SOLN
3.0000 mL | Freq: Four times a day (QID) | RESPIRATORY_TRACT | Status: DC
  Administered 2016-09-05 – 2016-09-07 (×6): 3 mL via RESPIRATORY_TRACT
  Filled 2016-09-05 (×6): qty 3

## 2016-09-05 MED ORDER — DEXTROSE 5 % IV SOLN
500.0000 mg | Freq: Once | INTRAVENOUS | Status: AC
  Administered 2016-09-05: 500 mg via INTRAVENOUS
  Filled 2016-09-05: qty 500

## 2016-09-05 MED ORDER — AZITHROMYCIN 250 MG PO TABS
500.0000 mg | ORAL_TABLET | Freq: Every day | ORAL | Status: AC
  Administered 2016-09-06 – 2016-09-07 (×2): 500 mg via ORAL
  Filled 2016-09-05 (×2): qty 2

## 2016-09-05 MED ORDER — NAPHAZOLINE-GLYCERIN 0.012-0.2 % OP SOLN
1.0000 [drp] | Freq: Four times a day (QID) | OPHTHALMIC | Status: DC | PRN
  Filled 2016-09-05: qty 15

## 2016-09-05 MED ORDER — FUROSEMIDE 10 MG/ML IJ SOLN
40.0000 mg | Freq: Two times a day (BID) | INTRAMUSCULAR | Status: DC
  Administered 2016-09-05 – 2016-09-06 (×3): 40 mg via INTRAVENOUS
  Filled 2016-09-05 (×2): qty 4

## 2016-09-05 NOTE — ED Notes (Signed)
Bed: WA01 Expected date:  Expected time:  Means of arrival:  Comments: COPD POV

## 2016-09-05 NOTE — ED Triage Notes (Signed)
Pt coming from her doctor's office by private vehicle.  Pt hx of COPD and is on home O2 with 2L.  Recently pt has been having increasing SOB despite being on O2.  Pt recently dx with pharyngitis. Pt a/o x 4 and presents with labored pursed lips breathing.  Lungs sounds on right sound slightly diminished.

## 2016-09-05 NOTE — ED Provider Notes (Signed)
[facility]-EMERGENCY DEPT Provider Note   CSN: [id number] Arrival date & time: 09/05/16  1129     History   Chief Complaint Chief Complaint  Patient presents with  . Shortness of Breath  . COPD    HPI Shelby KNEECE is a 83 y.o. female.  Patient is a 83 year old female with a history of CHF, COPD, hyperlipidemia and chronic A. fib/A flutter on Eliquis.  She presents with a 3 to four-day history of worsening shortness of breath. Started earlier in the week with a little bit of nasal congestion and scratchy throat and dry cough. The cough is becoming more productive over the last couple of days. She's had increasing shortness of breath. She is oxygen dependent on 2 L/m 24 hours a day. She is becoming short of breath even on her baseline oxygen. She was at her PCPs office earlier today and became hypoxic down into the 80s on ambulation with her oxygen. She denies any known fevers. No chest pain or tightness. No increased leg swelling. No nausea or vomiting. No abdominal distention. She has received a nebulizer treatment in the ED and is feeling better after this.      Past Medical History:  Diagnosis Date  . Anticoagulated    Eliquis  . Atrial flutter (HCC)    NO CARDIOLOGIST FOLLOWED BY DR Tori Milks  . Bloody stools    LAST 4 DAYS  . CHF (congestive heart failure) (HCC)   . Chronic back pain   . CKD (chronic kidney disease), stage III    Creatinine 1.35 -1.55  . COPD (chronic obstructive pulmonary disease) (HCC)   . Diverticulosis   . Dyspnea   . Dysrhythmia   . Edema   . Gait disorder   . Glaucoma    Severe  . Glaucoma    both eyes  . High cholesterol   . Multinodular goiter   . PAOD (peripheral arterial occlusive disease) (HCC)    Stein in right groin, decrease pulses  . PMR (polymyalgia rheumatica) (HCC)   . Pneumonia, organism unspecified(486) 04/18/2016  . Protein-calorie malnutrition (HCC)   . Rash of back   . Weakness     Patient Active Problem List   Diagnosis Date Noted  . Malnutrition of moderate degree 07/31/2016  . CRI (chronic renal insufficiency), stage 4 (severe) (HCC)   . Acute on chronic combined systolic and diastolic CHF (congestive heart failure) (HCC)   . Respiratory distress 07/29/2016  . Acute respiratory distress 07/29/2016  . CAD (coronary artery disease) 07/29/2016  . Chronic musculoskeletal pain 07/29/2016  . COPD without exacerbation (HCC) 07/29/2016  . Acute diastolic (congestive) heart failure (HCC)   . Chronic respiratory failure with hypoxia (HCC) 05/09/2016  . COPD (chronic obstructive pulmonary disease) (HCC) 05/09/2016  . Thrush, oral 05/02/2016  . Dyspnea 04/25/2016  . Gait disorder   . Weakness   . CKD (chronic kidney disease), stage III   . Protein-calorie malnutrition (HCC)   . Multinodular goiter   . PAOD (peripheral arterial occlusive disease) (HCC)   . Chronic back pain   . CHF (congestive heart failure) (HCC)   . Atrial flutter (HCC)   . Edema   . Glaucoma   . High cholesterol   . PMR (polymyalgia rheumatica) (HCC)   . CAP (community acquired pneumonia)   . Paroxysmal atrial fibrillation (HCC) 04/14/2016  . Chronic anticoagulation 04/14/2016  . Diverticulosis of colon (without mention of hemorrhage) 07/15/2013  . Arthropathy 04/06/2007  . ESOPHAGITIS 03/29/2007  Past Surgical History:  Procedure Laterality Date  . COLONOSCOPY N/A 07/15/2013   Procedure: COLONOSCOPY;  Surgeon: Louis Meckel, MD;  Location: [facility] ENDOSCOPY;  Service: Endoscopy;  Laterality: N/A;  . ESOPHAGOGASTRODUODENOSCOPY N/A 07/08/2013   Procedure: ESOPHAGOGASTRODUODENOSCOPY (EGD);  Surgeon: Louis Meckel, MD;  Location[facility];  Service: Endoscopy;  Laterality: N/A;  Per Connye Burkitt, will try to have CRNA available, but will not guaranteee. Ok to schedule per Clydie Braun 07/06/13  . GALLBLADDER SURGERY    . IR GENERIC HISTORICAL  05/18/2014   IR RADIOLOGIST EVAL & MGMT 05/18/2014 Berdine Dance, MD [facility] INTERV RAD  .  LAMINECTOMY    . leg stent Right   . SHOULDER SURGERY     frozen shoulder/ Bil shoulders    OB History    No data available       Home Medications    Prior to Admission medications   Medication Sig Start Date End Date Taking? Authorizing Provider  acetaminophen (TYLENOL) 500 MG tablet Take 500-1,000 mg by mouth every 4 (four) hours as needed for mild pain.    Yes [provider]  albuterol (PROAIR HFA) 108 (90 Base) MCG/ACT inhaler Inhale 2 puffs into the lungs every 6 (six) hours as needed for wheezing or shortness of breath. 07/18/16  Yes Coralyn Helling, MD  atorvastatin (LIPITOR) 10 MG tablet Take 5 mg by mouth daily.    Yes [provider]  bimatoprost (LUMIGAN) 0.01 % SOLN Place 1 drop into both eyes at bedtime.   Yes [provider]  cholecalciferol (VITAMIN D) 1000 UNITS tablet Take 1,000 Units by mouth 2 (two) times daily.    Yes [provider]  diltiazem (CARDIZEM) 120 MG tablet Take 240 mg by mouth daily.    Yes [provider]  ELIQUIS 2.5 MG TABS tablet Take 2.5 mg by mouth 2 (two) times daily. 04/11/16  Yes [provider]  feeding supplement, ENSURE ENLIVE, (ENSURE ENLIVE) LIQD Take 237 mLs by mouth 2 (two) times daily between meals. 08/01/16  Yes Ghimire, Werner Lean, MD  Fluticasone-Salmeterol (ADVAIR) 250-50 MCG/DOSE AEPB Inhale 1 puff into the lungs 2 (two) times daily.   Yes [provider]  furosemide (LASIX) 20 MG tablet Take 1 tablet (20 mg total) by mouth daily. 08/02/16  Yes Ghimire, Werner Lean, MD  OXYGEN Inhale 2 L into the lungs daily.   Yes [provider]  potassium chloride (K-DUR,KLOR-CON) 10 MEQ tablet Take 10 mEq by mouth 2 (two) times daily.   Yes [provider]  predniSONE (DELTASONE) 10 MG tablet Take 5 mg by mouth daily after supper.    Yes [provider]  tetrahydrozoline 0.05 % ophthalmic solution Place 1 drop into both eyes 4 (four) times daily as needed (For dry  eyes.).   Yes [provider]  traMADol-acetaminophen (ULTRACET) 37.5-325 MG per tablet Take 1-2 tablets by mouth 2 (two) times daily as needed for moderate pain.  06/01/13  Yes [provider]  vitamin E 1000 UNIT capsule Take 1,000 Units by mouth 2 (two) times daily.    Yes [provider]    Family History Family History  Problem Relation Age of Onset  . Throat cancer Mother   . Diabetes Maternal Uncle   . Throat cancer Maternal Grandfather   . Cirrhosis Maternal Aunt     Social History Social History  Substance Use Topics  . Smoking status: Former Smoker    Types: Cigarettes    Quit date: 07/12/1993  .  Smokeless tobacco: Never Used  . Alcohol use No     Allergies   Codeine sulfate   Review of Systems Review of Systems  Constitutional: Positive for fatigue. Negative for chills, diaphoresis and fever.  HENT: Negative for congestion, rhinorrhea and sneezing.   Eyes: Negative.   Respiratory: Positive for cough and shortness of breath. Negative for chest tightness.   Cardiovascular: Negative for chest pain and leg swelling.  Gastrointestinal: Negative for abdominal pain, blood in stool, diarrhea, nausea and vomiting.  Genitourinary: Negative for difficulty urinating, flank pain, frequency and hematuria.  Musculoskeletal: Negative for arthralgias and back pain.  Skin: Negative for rash.  Neurological: Negative for dizziness, speech difficulty, weakness, numbness and headaches.     Physical Exam Updated Vital Signs BP (!) 145/106   Pulse 96   Temp 97.9 F (36.6 C) (Oral)   Resp (!) 33   SpO2 98%   Physical Exam  Constitutional: She is oriented to person, place, and time. She appears well-developed and well-nourished.  HENT:  Head: Normocephalic and atraumatic.  Eyes: Pupils are equal, round, and reactive to light.  Neck: Normal range of motion. Neck supple.  Cardiovascular: Normal rate, regular rhythm and normal heart sounds.     Pulmonary/Chest: Effort normal. No respiratory distress. She has wheezes. She has no rales. She exhibits no tenderness.  Rhonchi bilaterally with some expiratory wheezes  Abdominal: Soft. Bowel sounds are normal. There is no tenderness. There is no rebound and no guarding.  Musculoskeletal: Normal range of motion. She exhibits no edema.  Lymphadenopathy:    She has no cervical adenopathy.  Neurological: She is alert and oriented to person, place, and time.  Skin: Skin is warm and dry. No rash noted.  Psychiatric: She has a normal mood and affect.     ED Treatments / Results  Labs (all labs ordered are listed, but only abnormal results are displayed) Labs Reviewed  BASIC METABOLIC PANEL - Abnormal; Notable for the following:       Result Value   Glucose, Bld 108 (*)    BUN 37 (*)    Creatinine, Ser 1.74 (*)    GFR calc non Af Amer 25 (*)    GFR calc Af Amer 29 (*)    All other components within normal limits  CBC - Abnormal; Notable for the following:    WBC 14.9 (*)    All other components within normal limits  TROPONIN I    EKG  EKG Interpretation  Date/Time:  Friday September 05 2016 11:42:38 EDT Ventricular Rate:  80 PR Interval:    QRS Duration: 135 QT Interval:  398 QTC Calculation: 460 R Axis:   65 Text Interpretation:  Atrial flutter Nonspecific intraventricular conduction delay Anteroseptal infarct, old Nonspecific T abnormalities, inferior leads Minimal ST elevation, inferior leads Baseline wander in lead(s) V3 Confirmed by Rolan Bucco [phone]) on 09/05/2016 1:20:55 PM       Radiology Dg Chest 2 View  Result Date: 09/05/2016 CLINICAL DATA:  Shortness of Breath EXAM: CHEST  2 VIEW COMPARISON:  Chest CT July 29, 2016 ; chest radiograph July 29, 2016 FINDINGS: There is atelectatic change in the left base, mild. Lungs elsewhere are clear. Heart size and pulmonary vascularity are normal. No adenopathy. There is aortic atherosclerosis. No bone lesions. IMPRESSION:  Mild left base atelectasis. No edema or consolidation. Previously noted small pleural effusions are no longer appreciable by radiography. There is aortic atherosclerosis. Cardiac silhouette is stable. Electronically Signed   By: Chrissie Noa  Margarita GrizzleWoodruff III M.D.   On: 09/05/2016 14:24    Procedures Procedures (including critical care time)  Medications Ordered in ED Medications  cefTRIAXone (ROCEPHIN) 1 g in dextrose 5 % 50 mL IVPB (1 g Intravenous New Bag/Given 09/05/16 1514)  azithromycin (ZITHROMAX) 500 mg in dextrose 5 % 250 mL IVPB (not administered)  albuterol (PROVENTIL) (2.5 MG/3ML) 0.083% nebulizer solution 5 mg (not administered)  ipratropium-albuterol (DUONEB) 0.5-2.5 (3) MG/3ML nebulizer solution 3 mL (3 mLs Nebulization Given 09/05/16 1215)  predniSONE (DELTASONE) tablet 60 mg (60 mg Oral Given 09/05/16 1512)     Initial Impression / Assessment and Plan / ED Course  I have reviewed the triage vital signs and the nursing notes.  Pertinent labs & imaging results that were available during my care of the patient were reviewed by me and considered in my medical decision making (see chart for details).     Patient presents with worsening cough and shortness of breath. She was very wheezy on initial ED presentation but has improved with some nebs although she still tachypnea can still feels short of breath on her oxygen. Chest x-ray doesn't show any obvious pneumonia but she has an elevated white count and productive cough. Given this, I did start her on Rocephin and Zithromax. She was also given prednisone. I will consult the hospitalist for admission. I spoke with Dr. Malachi BondsShort who will admit the patient.  Final Clinical Impressions(s) / ED Diagnoses   Final diagnoses:  COPD exacerbation (HCC)  Hypoxia    New Prescriptions New Prescriptions   No medications on file     Rolan BuccoBelfi, Carsen Machi, MD 09/05/16 1517

## 2016-09-05 NOTE — H&P (Addendum)
History and Physical    [name]:[id number] DOB: Apr 23, 1927 DOA: 09/05/2016  Referring MD/NP/PA: Rolan Bucco PCP: Nila Nephew, MD   Patient coming from: home  Chief Complaint: SOB  HPI: [name] is a 83 y.o. female with history of COPD on 2L home oxygen, chronic diastolic heart failure, paroxysmal atrial fibrillation and flutter, chronic kidney disease stage III, who presents with progressive shortness of breath and dry cough.  She was hospitalized in January for community-acquired pneumonia and then again in April for acute on chronic diastolic heart failure. She was started on Lasix in April. She was subsequently seen by her pulmonologist who started her on a 6 week prednisone taper and she is down to 5 mg daily. A proximally 1 week ago she developed increasing shortness of breath with a nonproductive cough. She denies fevers and chills. She denies chest tightness or pain. She has had no rhinorrhea or sinus congestion but she has had a very sore throat for the last week. She noticed some transient ankle swelling that improved with elevation and has since resolved and states that she has lost weight over the last couple of weeks, approximately 4 pounds. She has tried using her albuterol at home without improvement.  She presented to her PCP's office but was referred to the ER for respiratory distress.    ED Course: Afebrile. Tachypnea in the 20s and 30s, oxygen levels 89% on 2 L but gradually came up to 96% on 2 L.  White blood cell count 14.9 while on steroids.  Chest x-ray demonstrated mild left base atelectasis, resolution of small pleural effusions, stable cardiac silhouette. Per my own interpretation, she may have some Kerley B lines.  The emergency department physician heard rales on exam and treated with ceftriaxone and azithromycin for pneumonia. The patient was also given a DuoNeb, albuterol neb and prednisone 60 mg once. She had marginal improvement with these  interventions.  Review of Systems:  General:  Denies fevers, chills, weight loss or gain 4-lbs weight loss last week HEENT:  Denies changes to hearing and vision, rhinorrhea, sinus congestion, positive sore throat CV:  Denies chest pain and palpitations, lower extremity edema.  PULM:  Per HPI   GI:  Denies nausea, vomiting, constipation, diarrhea.   GU:  Denies dysuria, frequency, urgency ENDO:  Denies polyuria, polydipsia.   HEME:  Denies hematemesis, blood in stools, melena, abnormal bruising or bleeding.  LYMPH:  Denies lymphadenopathy.   MSK:  Denies arthralgias, myalgias.   DERM:  Denies skin rash or ulcer.   NEURO:  Denies focal numbness, weakness, slurred speech, confusion, facial droop.  PSYCH:  Denies anxiety and depression.    Past Medical History:  Diagnosis Date  . Anticoagulated    Eliquis  . Atrial flutter (HCC)    NO CARDIOLOGIST FOLLOWED BY DR Tori Milks  . Bloody stools    LAST 4 DAYS  . CHF (congestive heart failure) (HCC)   . Chronic back pain   . CKD (chronic kidney disease), stage III    Creatinine 1.35 -1.55  . COPD (chronic obstructive pulmonary disease) (HCC)   . Diverticulosis   . Dyspnea   . Dysrhythmia   . Edema   . Gait disorder   . Glaucoma    Severe  . Glaucoma    both eyes  . High cholesterol   . Multinodular goiter   . PAOD (peripheral arterial occlusive disease) (HCC)    Stein in right groin, decrease pulses  . PMR (  polymyalgia rheumatica) (HCC)   . Pneumonia, organism unspecified(486) 04/18/2016  . Protein-calorie malnutrition (HCC)   . Rash of back   . Weakness     Past Surgical History:  Procedure Laterality Date  . COLONOSCOPY N/A 07/15/2013   Procedure: COLONOSCOPY;  Surgeon: Louis Meckel, MD;  Location: [facility] END[facility]COPY;  Service: Endoscopy;  Laterality: N/A;  . ESOPHAGOGASTRODUODENOSCOPY N/A 07/08/2013   Procedure: ESOPHAGOGASTRODUODENOSCOPY (EGD);  Surgeon: Louis Meckel, MD;  Location: [facility];  Service: Endoscopy;   Laterality: N/A;  Per Connye Burkitt, will try to have CRNA available, but will not guaranteee. Ok to schedule per Clydie Braun 07/06/13  . GALLBLADDER SURGERY    . IR GENERIC HISTORICAL  05/18/2014   IR RADIOLOGIST EVAL & MGMT 05/18/2014 Berdine Dance, MD [facility] INTERV RAD  . LAMINECTOMY    . leg stent Right   . SHOULDER SURGERY     frozen shoulder/ Bil shoulders     reports that she quit smoking about 23 years ago. Her smoking use included Cigarettes. She has never used smokeless tobacco. She reports that she does not drink alcohol or use drugs.  Allergies  Allergen Reactions  . Codeine Sulfate Nausea And Vomiting and Other (See Comments)    headache    Family History  Problem Relation Age of Onset  . Throat cancer Mother   . Diabetes Maternal Uncle   . Throat cancer Maternal Grandfather   . Cirrhosis Maternal Aunt     Prior to Admission medications   Medication Sig Start Date End Date Taking? Authorizing Provider  acetaminophen (TYLENOL) 500 MG tablet Take 500-1,000 mg by mouth every 4 (four) hours as needed for mild pain.    Yes [provider]  albuterol (PROAIR HFA) 108 (90 Base) MCG/ACT inhaler Inhale 2 puffs into the lungs every 6 (six) hours as needed for wheezing or shortness of breath. 07/18/16  Yes Coralyn Helling, MD  atorvastatin (LIPITOR) 10 MG tablet Take 5 mg by mouth daily.    Yes [provider]  bimatoprost (LUMIGAN) 0.01 % SOLN Place 1 drop into both eyes at bedtime.   Yes [provider]  cholecalciferol (VITAMIN D) 1000 UNITS tablet Take 1,000 Units by mouth 2 (two) times daily.    Yes [provider]  diltiazem (CARDIZEM) 120 MG tablet Take 240 mg by mouth daily.    Yes [provider]  ELIQUIS 2.5 MG TABS tablet Take 2.5 mg by mouth 2 (two) times daily. 04/11/16  Yes [provider]  feeding supplement, ENSURE ENLIVE, (ENSURE ENLIVE) LIQD Take 237 mLs by mouth 2 (two) times daily between meals. 08/01/16  Yes Ghimire,  Werner Lean, MD  Fluticasone-Salmeterol (ADVAIR) 250-50 MCG/DOSE AEPB Inhale 1 puff into the lungs 2 (two) times daily.   Yes [provider]  furosemide (LASIX) 20 MG tablet Take 1 tablet (20 mg total) by mouth daily. 08/02/16  Yes Ghimire, Werner Lean, MD  OXYGEN Inhale 2 L into the lungs daily.   Yes [provider]  potassium chloride (K-DUR,KLOR-CON) 10 MEQ tablet Take 10 mEq by mouth 2 (two) times daily.   Yes [provider]  predniSONE (DELTASONE) 10 MG tablet Take 5 mg by mouth daily after supper.    Yes [provider]  tetrahydrozoline 0.05 % ophthalmic solution Place 1 drop into both eyes 4 (four) times daily as needed (For dry eyes.).   Yes [provider]  traMADol-acetaminophen (ULTRACET) 37.5-325 MG per tablet Take 1-2 tablets by mouth 2 (two) times  daily as needed for moderate pain.  06/01/13  Yes [provider]  vitamin E 1000 UNIT capsule Take 1,000 Units by mouth 2 (two) times daily.    Yes [provider]    Physical Exam: Vitals:   09/05/16 1216 09/05/16 1230 09/05/16 1300 09/05/16 1500  BP:  127/62 (!) 142/67 (!) 145/106  Pulse:  78 79 96  Resp:  (!) 24 (!) 32 (!) 33  Temp:      TempSrc:      SpO2: 98% 96% 97% 98%   Constitutional: Thin adult female, sitting upright, tachypneic to the 30s with pursed-lip breathing.   Eyes: PERRL, lids and conjunctivae normal ENMT: Mucous membranes are moist.  Right tonsil with a large purulence ulcer with surrounding erythema.    Neck: normal, supple, no masses, no thyromegaly Respiratory:  Diminished bilateral BS but prominent rales at the bilateral bases, no rhonchi or wheeze.   Cardiovascular:  RRR, no murmurs / rubs / gallops. No extremity edema. 2+ pedal pulses. No carotid bruits.  Abdomen: no tenderness, no masses palpated. No hepatosplenomegaly. Bowel sounds positive.  Musculoskeletal: no clubbing / cyanosis. No joint deformity upper and lower extremities. Good ROM, no  contractures. Normal muscle tone.  Skin: no rashes, lesions, ulcers. No induration Neurologic: CN 2-12 grossly intact. Sensation intact, DTR normal. Strength 5/5 in all 4.  Psychiatric: Normal judgment and insight. Alert and oriented x 3. Normal mood.   Labs on Admission: I have personally reviewed following labs and imaging studies  CBC:  Recent Labs Lab 09/05/16 1208  WBC 14.9*  HGB 14.1  HCT 42.5  MCV 91.8  PLT 276   Basic Metabolic Panel:  Recent Labs Lab 09/05/16 1208  NA 138  K 4.0  CL 104  CO2 25  GLUCOSE 108*  BUN 37*  CREATININE 1.74*  CALCIUM 8.9   GFR: Estimated Creatinine Clearance: 17.8 mL/min (A) (by C-G formula based on SCr of 1.74 mg/dL (H)). Liver Function Tests: No results for input(s): AST, ALT, ALKPHOS, BILITOT, PROT, ALBUMIN in the last 168 hours. No results for input(s): LIPASE, AMYLASE in the last 168 hours. No results for input(s): AMMONIA in the last 168 hours. Coagulation Profile: No results for input(s): INR, PROTIME in the last 168 hours. Cardiac Enzymes:  Recent Labs Lab 09/05/16 1208  TROPONINI <0.03   BNP (last 3 results) No results for input(s): PROBNP in the last 8760 hours. HbA1C: No results for input(s): HGBA1C in the last 72 hours. CBG: No results for input(s): GLUCAP in the last 168 hours. Lipid Profile: No results for input(s): CHOL, HDL, LDLCALC, TRIG, CHOLHDL, LDLDIRECT in the last 72 hours. Thyroid Function Tests: No results for input(s): TSH, T4TOTAL, FREET4, T3FREE, THYROIDAB in the last 72 hours. Anemia Panel: No results for input(s): VITAMINB12, FOLATE, FERRITIN, TIBC, IRON, RETICCTPCT in the last 72 hours. Urine analysis:    Component Value Date/Time   COLORURINE STRAW (A) 04/14/2016 1345   APPEARANCEUR CLEAR 04/14/2016 1345   LABSPEC 1.003 (L) 04/14/2016 1345   PHURINE 6.0 04/14/2016 1345   GLUCOSEU NEGATIVE 04/14/2016 1345   HGBUR NEGATIVE 04/14/2016 1345   BILIRUBINUR NEGATIVE 04/14/2016 1345    KETONESUR NEGATIVE 04/14/2016 1345   PROTEINUR NEGATIVE 04/14/2016 1345   NITRITE NEGATIVE 04/14/2016 1345   LEUKOCYTESUR NEGATIVE 04/14/2016 1345   Sepsis Labs: @LABRCNTIP (procalcitonin:4,lacticidven:4) )No results found for this or any previous visit (from the past 240 hour(s)).   Radiological Exams on Admission: Dg Chest 2 View  Result Date: 09/05/2016 CLINICAL DATA:  Shortness of Breath EXAM: CHEST  2 VIEW COMPARISON:  Chest CT July 29, 2016 ; chest radiograph July 29, 2016 FINDINGS: There is atelectatic change in the left base, mild. Lungs elsewhere are clear. Heart size and pulmonary vascularity are normal. No adenopathy. There is aortic atherosclerosis. No bone lesions. IMPRESSION: Mild left base atelectasis. No edema or consolidation. Previously noted small pleural effusions are no longer appreciable by radiography. There is aortic atherosclerosis. Cardiac silhouette is stable. Electronically Signed   By: Bretta BangWilliam  Woodruff III M.D.   On: 09/05/2016 14:24    EKG: Independently reviewed. Atrial flutter, possible ST-segment elevations in the inferior leads but appears similar to P-wave complex.    Assessment/Plan Active Problems:   * No active hospital problems. *    Acute on chronic hypoxemic respiratory failure (baseline 2L), likely due to acute on chronic diastolic heart failure with underlying COPD.  Her symptoms have not improved with albuterol and she has rales on exam suggesting heart failure.  Doubt pneumonia given bilateral nature of her rales, lack of fever, chills, leukocytosis and no findings on CXR.  If treating for pneumonia, would need to treat for HCAP as she was hospitalized in April.  She has been tapering her steroids, so that may also have triggered a COPD exacerbation.  Denies calf pain and has been on anticoagulation, so doubt PE.  Given severity of her symptoms, will treat for both CHF and COPD until she is starting to feel better.   -  Daily weights -  Strict  I/O -  Lasix 40mg  IV BID (may need higher dose given CKD) -  Check procalcitonin -  Continue duonebs, azithromycin, and prednisone burst 40mg  daily  -  Will not repeat ECHO as last one done in April 2018, EF preserved  Possible ST segment elevations inferior leads on ECG, but chest pain free, no nausea or diaphoresis.  I think these are p-waves that make it look like ST-elevations.   -  Telemetry -  Cycle troponins -  Repeat ECG in AM  Sore throat with tonsillitis.   -  Will not test for strep since treating with antibiotics anyway  Paroxysmal atrial flutter/fibrillation, [place] Vasc score of 5 - Rate controlled with Cardizem -  continue anticoagulation with Eliquis  Chronic kidney disease stage III, creatinine at baseline of 1.7 -  Trend creatinine with diuresis -  Renally dose medications  Weight loss, poor appetite  -  Nutrition consultation -  Regular diet with supplements  DVT prophylaxis: apixaban  Code Status: DNR Family Communication: patient and her daughter who was present at bedside, questions answered  Disposition Plan:  Unclear disposition at this time, may need [facility] term rehab depending on progression  Consults called: none  Admission status: inpatient, telemetry.  Currently in respiratory distress with underlying COPD and acute on chronic diastolic heart failure.    Renae FickleSHORT, Daylah Sayavong MD [facility] Pager [phone]  If 7PM-7AM, please contact night-coverage [email or web address] Password [email or web address]  09/05/2016, 3:14 PM

## 2016-09-06 DIAGNOSIS — N179 Acute kidney failure, unspecified: Secondary | ICD-10-CM

## 2016-09-06 DIAGNOSIS — J441 Chronic obstructive pulmonary disease with (acute) exacerbation: Secondary | ICD-10-CM

## 2016-09-06 DIAGNOSIS — J189 Pneumonia, unspecified organism: Principal | ICD-10-CM

## 2016-09-06 DIAGNOSIS — I4892 Unspecified atrial flutter: Secondary | ICD-10-CM

## 2016-09-06 DIAGNOSIS — J9621 Acute and chronic respiratory failure with hypoxia: Secondary | ICD-10-CM

## 2016-09-06 LAB — BASIC METABOLIC PANEL
ANION GAP: 13 (ref 5–15)
BUN: 37 mg/dL — ABNORMAL HIGH (ref 6–20)
CHLORIDE: 101 mmol/L (ref 101–111)
CO2: 22 mmol/L (ref 22–32)
Calcium: 8.8 mg/dL — ABNORMAL LOW (ref 8.9–10.3)
Creatinine, Ser: 2.01 mg/dL — ABNORMAL HIGH (ref 0.44–1.00)
GFR calc Af Amer: 25 mL/min — ABNORMAL LOW (ref >60)
GFR, EST NON AFRICAN AMERICAN: 21 mL/min — AB (ref >60)
Glucose, Bld: 185 mg/dL — ABNORMAL HIGH (ref 65–99)
POTASSIUM: 4.3 mmol/L (ref 3.5–5.1)
SODIUM: 136 mmol/L (ref 135–145)

## 2016-09-06 LAB — TROPONIN I: Troponin I: <0.03 ng/mL (ref <0.03)

## 2016-09-06 MED ORDER — SODIUM CHLORIDE 0.9 % IV BOLUS (SEPSIS)
500.0000 mL | Freq: Once | INTRAVENOUS | Status: AC
  Administered 2016-09-06: 500 mL via INTRAVENOUS

## 2016-09-06 MED ORDER — DEXTROSE 5 % IV SOLN
1.0000 g | INTRAVENOUS | Status: DC
  Administered 2016-09-06 – 2016-09-07 (×2): 1 g via INTRAVENOUS
  Filled 2016-09-06 (×3): qty 10

## 2016-09-06 NOTE — Progress Notes (Addendum)
PROGRESS NOTE [facility]   ARLEEN BAR   ZOX:[id number] DOB: [birth year]  DOA: 09/05/2016 PCP: Nila Nephew, MD   Brief Narrative:  83 year old female with medical history of COPD on 2 L nasal cannula at home, chronic diastolic failure, paroxysmal A. fib and flutter, chronic kidney disease stage III presented with progressive shortness of breath dry cough. In the ED she was found to have tachypnea with oxygen levels down to the 80s, chest x-ray demonstrated mild left base atelectasis with small pleural effusions. Patient admitted for acute on chronic hypoxemic respiratory failure due to COPD exacerbation, possible pneumonia and possible CHF exacerbation.  Subjective: Patient seen and examined, report significant improvement of her breathing.  Assessment & Plan: Acute on chronic hypoxemic respiratory failure, likely secondary to COPD exacerbation and pneumonia. On x-ray reviewed by me right base infiltrate consistent with rales in physical exam. Unlikely to be CHF, no edema after giving Lasix creatinine increasing meaning patient is dry BNP was elevated but could be baseline Will treat for pneumonia and COPD exacerbation continue azithromycin will add Rocephin Repeat chest x-ray in the morning after hydration Continue prednisone 40 mg daily Continue duo nebs 4 times daily and albuterol when necessary. Wean O2 to baseline 2 L at home patient is chronic COPD okay to keep saturation above 88%. Pro-calcitonin elevated will continue to trend  CAP See above  Acute renal failure on chronic kidney disease stage III, baseline creatinine of 1.7 Creatinine trended up to 2.0 likely due to aggressive diuresis We'll give 500 mL bolus as patient has no signs of CHF Discontinue Lasix for now Encourage oral hydration  Atrial flutter EKG consistent with flutter, no specific ST wave abnormalities, likely wondering of the baseline Patient asymptomatic Troponin negative 4 Continue Cardizem  and Eliquis for anticoagulation Repeat EKG in AM   DVT prophylaxis: Eliquis  Code Status: FULL  Family Communication: Husband at bedside Disposition Plan: Home likely in the next 24 - 48 hrs.  Consultants:   None  Procedures:   None  Antimicrobials: Anti-infectives    Start     Dose/Rate Route Frequency Ordered Stop   09/06/16 2000  cefTRIAXone (ROCEPHIN) 1 g in dextrose 5 % 50 mL IVPB     1 g 100 mL/hr over 30 Minutes Intravenous Every 24 hours 09/06/16 1922     09/06/16 1000  azithromycin (ZITHROMAX) tablet 500 mg     500 mg Oral Daily 09/05/16 1913 09/08/16 0959   09/05/16 1515  cefTRIAXone (ROCEPHIN) 1 g in dextrose 5 % 50 mL IVPB     1 g 100 mL/hr over 30 Minutes Intravenous  Once 09/05/16 1507 09/05/16 1927   09/05/16 1515  azithromycin (ZITHROMAX) 500 mg in dextrose 5 % 250 mL IVPB     500 mg 250 mL/hr over 60 Minutes Intravenous  Once 09/05/16 1507 09/05/16 1758        Objective: Vitals:   09/06/16 1158 09/06/16 1336 09/06/16 1427 09/06/16 1547  BP: (!) 122/55  120/60   Pulse:   100   Resp:   16   Temp:   98.1 F (36.7 C)   TempSrc:   Oral   SpO2:  96% 100% 97%  Weight:      Height:        Intake/Output Summary (Last 24 hours) at 09/06/16 1916 Last data filed at 09/06/16 1904  Gross per 24 hour  Intake              960 ml  Output  2625 ml  Net            -1665 ml   Filed Weights   09/05/16 1900 09/06/16 0500  Weight: 50.4 kg (111 lb 1.8 oz) 50.2 kg (110 lb 10.7 oz)    Examination:  General exam: Appears calm and comfortable, On O2 2 L nasal cannula Respiratory system: Diminished breath sounds bilaterally, rales on the right lower base, mild rhonchi in the right side. No wheezing. No use of accessory muscles Cardiovascular system: S1 & S2 heard, irregular. No JVD, 2/6 systolic murmur, rubs or gallops Gastrointestinal system: Abdomen is nondistended, soft and nontender.  Central nervous system: Alert and oriented. Non  focal Extremities: No pedal edema. Symmetric  Skin: No rashes, lesions or ulcers Psychiatry: Judgement and insight appear normal. Mood & affect appropriate.    Data Reviewed: I have personally reviewed following labs and imaging studies  CBC:  Recent Labs Lab 09/05/16 1208  WBC 14.9*  HGB 14.1  HCT 42.5  MCV 91.8  PLT 276   Basic Metabolic Panel:  Recent Labs Lab 09/05/16 1208 09/06/16 0108  NA 138 136  K 4.0 4.3  CL 104 101  CO2 25 22  GLUCOSE 108* 185*  BUN 37* 37*  CREATININE 1.74* 2.01*  CALCIUM 8.9 8.8*   GFR: Estimated Creatinine Clearance: 15.6 mL/min (A) (by C-G formula based on SCr of 2.01 mg/dL (H)). Liver Function Tests: No results for input(s): AST, ALT, ALKPHOS, BILITOT, PROT, ALBUMIN in the last 168 hours. No results for input(s): LIPASE, AMYLASE in the last 168 hours. No results for input(s): AMMONIA in the last 168 hours. Coagulation Profile: No results for input(s): INR, PROTIME in the last 168 hours. Cardiac Enzymes:  Recent Labs Lab 09/05/16 1208 09/05/16 1919 09/06/16 0108 09/06/16 0805  TROPONINI <0.03 <0.03 <0.03 <0.03   BNP (last 3 results) No results for input(s): PROBNP in the last 8760 hours. HbA1C: No results for input(s): HGBA1C in the last 72 hours. CBG: No results for input(s): GLUCAP in the last 168 hours. Lipid Profile: No results for input(s): CHOL, HDL, LDLCALC, TRIG, CHOLHDL, LDLDIRECT in the last 72 hours. Thyroid Function Tests: No results for input(s): TSH, T4TOTAL, FREET4, T3FREE, THYROIDAB in the last 72 hours. Anemia Panel: No results for input(s): VITAMINB12, FOLATE, FERRITIN, TIBC, IRON, RETICCTPCT in the last 72 hours. Sepsis Labs:  Recent Labs Lab 09/05/16 1919  PROCALCITON 0.26    No results found for this or any previous visit (from the past 240 hour(s)).    Radiology Studies: Dg Chest 2 View  Result Date: 09/05/2016 CLINICAL DATA:  Shortness of Breath EXAM: CHEST  2 VIEW COMPARISON:  Chest CT  July 29, 2016 ; chest radiograph July 29, 2016 FINDINGS: There is atelectatic change in the left base, mild. Lungs elsewhere are clear. Heart size and pulmonary vascularity are normal. No adenopathy. There is aortic atherosclerosis. No bone lesions. IMPRESSION: Mild left base atelectasis. No edema or consolidation. Previously noted small pleural effusions are no longer appreciable by radiography. There is aortic atherosclerosis. Cardiac silhouette is stable. Electronically Signed   By: Bretta Bang III M.D.   On: 09/05/2016 14:24      Scheduled Meds: . apixaban  2.5 mg Oral BID  . atorvastatin  5 mg Oral Daily  . azithromycin  500 mg Oral Daily  . diltiazem  240 mg Oral Daily  . feeding supplement (ENSURE ENLIVE)  237 mL Oral BID BM  . fluticasone furoate-vilanterol  1 puff Inhalation Daily  .  furosemide  40 mg Intravenous BID  . ipratropium-albuterol  3 mL Nebulization QID  . latanoprost  1 drop Both Eyes QHS  . potassium chloride  10 mEq Oral BID  . predniSONE  40 mg Oral Q breakfast  . sodium chloride flush  3 mL Intravenous Q12H   Continuous Infusions: . sodium chloride       LOS: 1 day    Latrelle DodrillEdwin Silva, MD Pager: Text Page via [email or web address]  [phone]  If 7PM-7AM, please contact night-coverage [email or web address] Password [email or web address] 09/06/2016, 7:16 PM

## 2016-09-06 NOTE — Progress Notes (Signed)
PT Cancellation Note  Patient Details Name: [name] MRN: [id number] DOB: [birth year]   Cancelled Treatment:     PT order received but eval deferred - BP 73/65.  Will follow.   [name] 09/06/2016, 12:20 PM

## 2016-09-07 ENCOUNTER — Inpatient Hospital Stay (HOSPITAL_COMMUNITY): Payer: Medicare Other

## 2016-09-07 DIAGNOSIS — J181 Lobar pneumonia, unspecified organism: Secondary | ICD-10-CM

## 2016-09-07 LAB — PROCALCITONIN: Procalcitonin: 0.24 ng/mL

## 2016-09-07 LAB — BASIC METABOLIC PANEL
Anion gap: 11 (ref 5–15)
BUN: 43 mg/dL — ABNORMAL HIGH (ref 6–20)
CO2: 27 mmol/L (ref 22–32)
Calcium: 9 mg/dL (ref 8.9–10.3)
Chloride: 103 mmol/L (ref 101–111)
Creatinine, Ser: 1.84 mg/dL — ABNORMAL HIGH (ref 0.44–1.00)
GFR calc Af Amer: 27 mL/min — ABNORMAL LOW (ref >60)
GFR, EST NON AFRICAN AMERICAN: 24 mL/min — AB (ref >60)
Glucose, Bld: 112 mg/dL — ABNORMAL HIGH (ref 65–99)
POTASSIUM: 3.8 mmol/L (ref 3.5–5.1)
Sodium: 141 mmol/L (ref 135–145)

## 2016-09-07 LAB — CBC
HCT: 38.8 % (ref 36.0–46.0)
HEMOGLOBIN: 12.8 g/dL (ref 12.0–15.0)
MCH: 30.1 pg (ref 26.0–34.0)
MCHC: 33 g/dL (ref 30.0–36.0)
MCV: 91.3 fL (ref 78.0–100.0)
Platelets: 325 10*3/uL (ref 150–400)
RBC: 4.25 MIL/uL (ref 3.87–5.11)
RDW: 14.5 % (ref 11.5–15.5)
WBC: 17.5 10*3/uL — ABNORMAL HIGH (ref 4.0–10.5)

## 2016-09-07 MED ORDER — BENZONATATE 100 MG PO CAPS
100.0000 mg | ORAL_CAPSULE | Freq: Three times a day (TID) | ORAL | Status: DC | PRN
  Administered 2016-09-07 (×2): 100 mg via ORAL
  Filled 2016-09-07 (×2): qty 1

## 2016-09-07 MED ORDER — IPRATROPIUM BROMIDE 0.02 % IN SOLN
0.5000 mg | Freq: Three times a day (TID) | RESPIRATORY_TRACT | Status: DC
  Administered 2016-09-08: 0.5 mg via RESPIRATORY_TRACT
  Filled 2016-09-07 (×2): qty 2.5

## 2016-09-07 MED ORDER — IPRATROPIUM BROMIDE 0.02 % IN SOLN
0.5000 mg | Freq: Three times a day (TID) | RESPIRATORY_TRACT | Status: DC
  Administered 2016-09-07 (×2): 0.5 mg via RESPIRATORY_TRACT
  Filled 2016-09-07 (×2): qty 2.5

## 2016-09-07 MED ORDER — HYDROCOD POLST-CPM POLST ER 10-8 MG/5ML PO SUER
5.0000 mL | Freq: Two times a day (BID) | ORAL | Status: AC | PRN
  Administered 2016-09-07 (×2): 5 mL via ORAL
  Filled 2016-09-07 (×2): qty 5

## 2016-09-07 MED ORDER — LEVALBUTEROL HCL 0.63 MG/3ML IN NEBU
INHALATION_SOLUTION | RESPIRATORY_TRACT | Status: AC
  Filled 2016-09-07: qty 3

## 2016-09-07 MED ORDER — IPRATROPIUM-ALBUTEROL 0.5-2.5 (3) MG/3ML IN SOLN
3.0000 mL | Freq: Three times a day (TID) | RESPIRATORY_TRACT | Status: DC
  Filled 2016-09-07: qty 3

## 2016-09-07 MED ORDER — LEVALBUTEROL HCL 1.25 MG/0.5ML IN NEBU
1.2500 mg | INHALATION_SOLUTION | Freq: Three times a day (TID) | RESPIRATORY_TRACT | Status: DC
  Administered 2016-09-08: 1.25 mg via RESPIRATORY_TRACT
  Filled 2016-09-07 (×2): qty 0.5

## 2016-09-07 MED ORDER — LEVALBUTEROL HCL 1.25 MG/0.5ML IN NEBU
1.2500 mg | INHALATION_SOLUTION | Freq: Three times a day (TID) | RESPIRATORY_TRACT | Status: DC
  Administered 2016-09-07 (×2): 1.25 mg via RESPIRATORY_TRACT
  Filled 2016-09-07 (×2): qty 0.5

## 2016-09-07 MED ORDER — LEVALBUTEROL HCL 0.63 MG/3ML IN NEBU: 0.6300 mg | INHALATION_SOLUTION | RESPIRATORY_TRACT | Status: DC | PRN

## 2016-09-07 NOTE — Progress Notes (Signed)
OT Cancellation Note  Patient Details Name: [name] MRN: [id number] DOB: 1927-07-23   Cancelled Treatment:    Reason Eval/Treat Not Completed: Fatigue/lethargy limiting ability to participate -- does not wish to participate in OT today. Will follow up next date.  [name] 09/07/2016, 11:35 AM

## 2016-09-07 NOTE — Progress Notes (Signed)
Patient is coughing, having a hard time trying to sleep. PCP was notified.

## 2016-09-07 NOTE — Progress Notes (Signed)
PT Cancellation Note  Patient Details Name: [name] MRN: [id number] DOB: 1928/02/13   Cancelled Treatment:    Reason Eval/Treat Not Completed: Fatigue/lethargy limiting ability to participate . patient reports that she has been getting up in the room. Wants to defer PT today.  [name], [name] 09/07/2016, 11:00 AM [name] PT [phone]

## 2016-09-07 NOTE — Progress Notes (Addendum)
PROGRESS NOTE [facility]   [name]   ZOX:[id number]:[id number] DOB: February 22, 1928  DOA: 09/05/2016 PCP: Nila NephewGreen, Kristle Wesch, MD   Brief Narrative:  83 year old female with medical history of COPD on 2 L nasal cannula at home, chronic diastolic failure, paroxysmal A. fib and flutter, chronic kidney disease stage III presented with progressive shortness of breath dry cough. In the ED she was found to have tachypnea with oxygen levels down to the 80s, chest x-ray demonstrated mild left base atelectasis with small pleural effusions. Patient admitted for acute on chronic hypoxemic respiratory failure due to COPD exacerbation, possible pneumonia and possible CHF exacerbation.  Subjective: Patient seen and examined, she does not feel better this morning. She feels that her SOB has worsen and began to cough frequently this overnight. Difficult speaking in full sentences.   Assessment & Plan: Acute on chronic hypoxemic respiratory failure, likely secondary to COPD exacerbation and pneumonia. On x-ray reviewed by me right base infiltrate consistent with rales in physical exam. Unlikely to be CHF, no edema after giving Lasix creatinine increasing meaning patient is dry BNP was elevated but could be baseline Will treat for pneumonia and COPD exacerbation continue azithromycin and Rocephin Wean O2 to baseline 2 L at home patient is chronic COPD okay to keep saturation above 88%.  CAP Repeated CXR more consistent with RLL PNA, clinical findings also consistent.  Pro-calcitonin 0.24  Continue Abx for now  Leukocytosis - increased likely due to steroids  COPD exacerbation due to CAP  Continue Prednisone 40 mg daily  Patient became tachycardic after nebs treatment will change albuterol for Xopenex TID  Continue Atrovent  Wean O2 to baseline, keep sats > 88%   Acute renal failure on chronic kidney disease stage III, baseline creatinine of 1.7 Creatinine trended up to 2.0 likely due to aggressive diuresis,  now decreased after gentle hydration  Encourage oral hydration Monitor BMP in AM   Atrial flutter/Afib  EKG consistent with flutter, no specific ST wave abnormalities, likely wondering of the baseline Patient asymptomatic Troponin negative 4 Continue Cardizem and Eliquis for anticoagulation  DVT prophylaxis: Eliquis  Code Status: FULL  Family Communication: Daughter at bedside  Disposition Plan: Home likely in the next 24 hrs   Consultants:   None  Procedures:   None  Antimicrobials: Anti-infectives    Start     Dose/Rate Route Frequency Ordered Stop   09/06/16 2000  cefTRIAXone (ROCEPHIN) 1 g in dextrose 5 % 50 mL IVPB     1 g 100 mL/hr over 30 Minutes Intravenous Every 24 hours 09/06/16 1922     09/06/16 1000  azithromycin (ZITHROMAX) tablet 500 mg     500 mg Oral Daily 09/05/16 1913 09/07/16 1008   09/05/16 1515  cefTRIAXone (ROCEPHIN) 1 g in dextrose 5 % 50 mL IVPB     1 g 100 mL/hr over 30 Minutes Intravenous  Once 09/05/16 1507 09/05/16 1927   09/05/16 1515  azithromycin (ZITHROMAX) 500 mg in dextrose 5 % 250 mL IVPB     500 mg 250 mL/hr over 60 Minutes Intravenous  Once 09/05/16 1507 09/05/16 1758       Objective: Vitals:   09/06/16 2023 09/07/16 0600 09/07/16 0852 09/07/16 1403  BP:  (!) 144/79  96/64  Pulse:  71  98  Resp:  20  18  Temp:  98.4 F (36.9 C)  97.7 F (36.5 C)  TempSrc:  Oral  Oral  SpO2: 96% 97% 96% 92%  Weight:  50 kg (110  lb 3.7 oz)    Height:        Intake/Output Summary (Last 24 hours) at 09/07/16 1621 Last data filed at 09/07/16 1400  Gross per 24 hour  Intake             1510 ml  Output             1600 ml  Net              -90 ml   Filed Weights   09/05/16 1900 09/06/16 0500 09/07/16 0600  Weight: 50.4 kg (111 lb 1.8 oz) 50.2 kg (110 lb 10.7 oz) 50 kg (110 lb 3.7 oz)    Examination:  General: Pt is alert, awake, mild respiratory distress Cardiovascular: Irregular, S1/S2 + 2/6 SM , no rubs, no gallops Respiratory:  Breath sound diminish b/l but mild improvement, Rales at the RLL, no wheezing  Abdominal: Soft, NT, ND, bowel sounds + Extremities: no edema, no cyanosis   Data Reviewed: I have personally reviewed following labs and imaging studies  CBC:  Recent Labs Lab 09/05/16 1208 09/07/16 0511  WBC 14.9* 17.5*  HGB 14.1 12.8  HCT 42.5 38.8  MCV 91.8 91.3  PLT 276 325   Basic Metabolic Panel:  Recent Labs Lab 09/05/16 1208 09/06/16 0108 09/07/16 0511  NA 138 136 141  K 4.0 4.3 3.8  CL 104 101 103  CO2 25 22 27   GLUCOSE 108* 185* 112*  BUN 37* 37* 43*  CREATININE 1.74* 2.01* 1.84*  CALCIUM 8.9 8.8* 9.0   GFR: Estimated Creatinine Clearance: 17 mL/min (A) (by C-G formula based on SCr of 1.84 mg/dL (H)). Liver Function Tests: No results for input(s): AST, ALT, ALKPHOS, BILITOT, PROT, ALBUMIN in the last 168 hours. No results for input(s): LIPASE, AMYLASE in the last 168 hours. No results for input(s): AMMONIA in the last 168 hours. Coagulation Profile: No results for input(s): INR, PROTIME in the last 168 hours. Cardiac Enzymes:  Recent Labs Lab 09/05/16 1208 09/05/16 1919 09/06/16 0108 09/06/16 0805  TROPONINI <0.03 <0.03 <0.03 <0.03   BNP (last 3 results) No results for input(s): PROBNP in the last 8760 hours. HbA1C: No results for input(s): HGBA1C in the last 72 hours. CBG: No results for input(s): GLUCAP in the last 168 hours. Lipid Profile: No results for input(s): CHOL, HDL, LDLCALC, TRIG, CHOLHDL, LDLDIRECT in the last 72 hours. Thyroid Function Tests: No results for input(s): TSH, T4TOTAL, FREET4, T3FREE, THYROIDAB in the last 72 hours. Anemia Panel: No results for input(s): VITAMINB12, FOLATE, FERRITIN, TIBC, IRON, RETICCTPCT in the last 72 hours. Sepsis Labs:  Recent Labs Lab 09/05/16 1919 09/07/16 0511  PROCALCITON 0.26 0.24    No results found for this or any previous visit (from the past 240 hour(s)).    Radiology Studies: Dg Chest Port 1  View  Result Date: 09/07/2016 CLINICAL DATA:  Pneumonia. EXAM: PORTABLE CHEST 1 VIEW COMPARISON:  09/06/2011.  07/30/2011 FINDINGS: Worsening of lung density in the lower chest since 2 days ago. Radiographically, the pattern is more suggestive of small effusions and interstitial edema, but pneumonia is possible. Aortic atherosclerosis again noted. IMPRESSION: Worsening of density in the lower chest most consistent with small effusions and interstitial edema. Basilar pneumonia not excluded. Electronically Signed   By: Paulina Fusi M.D.   On: 09/07/2016 07:14      Scheduled Meds: . apixaban  2.5 mg Oral BID  . atorvastatin  5 mg Oral Daily  . diltiazem  240 mg Oral  Daily  . feeding supplement (ENSURE ENLIVE)  237 mL Oral BID BM  . fluticasone furoate-vilanterol  1 puff Inhalation Daily  . ipratropium  0.5 mg Nebulization TID  . latanoprost  1 drop Both Eyes QHS  . levalbuterol      . levalbuterol  1.25 mg Nebulization Q8H  . potassium chloride  10 mEq Oral BID  . predniSONE  40 mg Oral Q breakfast  . sodium chloride flush  3 mL Intravenous Q12H   Continuous Infusions: . sodium chloride    . cefTRIAXone (ROCEPHIN)  IV Stopped (09/06/16 2045)     LOS: 2 days    Latrelle Dodrill, MD Pager: Text Page via [email or web address]  [phone]  If 7PM-7AM, please contact night-coverage [email or web address] Password [email or web address] 09/07/2016, 4:21 PM

## 2016-09-08 DIAGNOSIS — I482 Chronic atrial fibrillation: Secondary | ICD-10-CM

## 2016-09-08 LAB — CBC WITH DIFFERENTIAL/PLATELET
Basophils Absolute: 0 10*3/uL (ref 0.0–0.1)
Basophils Relative: 0 %
EOS ABS: 0 10*3/uL (ref 0.0–0.7)
EOS PCT: 0 %
HCT: 36.7 % (ref 36.0–46.0)
Hemoglobin: 12 g/dL (ref 12.0–15.0)
LYMPHS ABS: 0.7 10*3/uL (ref 0.7–4.0)
Lymphocytes Relative: 5 %
MCH: 29.9 pg (ref 26.0–34.0)
MCHC: 32.7 g/dL (ref 30.0–36.0)
MCV: 91.5 fL (ref 78.0–100.0)
Monocytes Absolute: 1.5 10*3/uL — ABNORMAL HIGH (ref 0.1–1.0)
Monocytes Relative: 10 %
Neutro Abs: 12.8 10*3/uL — ABNORMAL HIGH (ref 1.7–7.7)
Neutrophils Relative %: 85 %
Platelets: 307 10*3/uL (ref 150–400)
RBC: 4.01 MIL/uL (ref 3.87–5.11)
RDW: 14.4 % (ref 11.5–15.5)
WBC: 15 10*3/uL — ABNORMAL HIGH (ref 4.0–10.5)

## 2016-09-08 LAB — BASIC METABOLIC PANEL
Anion gap: 10 (ref 5–15)
BUN: 42 mg/dL — AB (ref 6–20)
CHLORIDE: 104 mmol/L (ref 101–111)
CO2: 27 mmol/L (ref 22–32)
CREATININE: 1.64 mg/dL — AB (ref 0.44–1.00)
Calcium: 9.1 mg/dL (ref 8.9–10.3)
GFR calc Af Amer: 31 mL/min — ABNORMAL LOW (ref >60)
GFR calc non Af Amer: 27 mL/min — ABNORMAL LOW (ref >60)
Glucose, Bld: 106 mg/dL — ABNORMAL HIGH (ref 65–99)
Potassium: 5.1 mmol/L (ref 3.5–5.1)
SODIUM: 141 mmol/L (ref 135–145)

## 2016-09-08 MED ORDER — BENZONATATE 100 MG PO CAPS: 100.0000 mg | ORAL_CAPSULE | Freq: Three times a day (TID) | ORAL | 0 refills | Status: DC | PRN

## 2016-09-08 MED ORDER — CEFPODOXIME PROXETIL 200 MG PO TABS: 200.0000 mg | ORAL_TABLET | Freq: Every day | ORAL | 0 refills | Status: AC

## 2016-09-08 MED ORDER — PREDNISONE 10 MG PO TABS: ORAL_TABLET | ORAL | 0 refills | Status: DC

## 2016-09-08 MED ORDER — FUROSEMIDE 20 MG PO TABS: 20.0000 mg | ORAL_TABLET | Freq: Every day | ORAL | 0 refills | Status: DC

## 2016-09-08 NOTE — Evaluation (Signed)
Physical Therapy Evaluation Patient Details Name: [name] MRN: [id number] DOB: 1927/10/13 Today's Date: 09/08/2016   History of Present Illness  83yo female with onset of SOB, acute respiratory failure from CHF and recent PNA, now admitted with increased SOB, on 2L O2 at home.  PMHx:  PAD, a-flutter, CHF, COPD, CKD, EF 55%  Clinical Impression  The patient  Tolerated ambulating x45 ' with Rw, no assistance required, able to use RW safely. On 2 liters O2, sats 91%, dyspnea 2-3/4. Deferred DC plan to patient , spouse and MSW to discuss. Pt admitted with above diagnosis. Pt currently with functional limitations due to the deficits listed below (see PT Problem List).  Pt will benefit from skilled PT to increase their independence and safety with mobility to allow discharge to the venue listed below.       Follow Up Recommendations SNF;Home health PT;Supervision/Assistance - 24 hour - the patient declines going to SNF   Equipment Recommendations  None recommended by PT    Recommendations for Other Services       Precautions / Restrictions Precautions Precaution Comments: monitor sats Restrictions Weight Bearing Restrictions: No      Mobility  Bed Mobility Overal bed mobility: Independent                Transfers Overall transfer level: Needs assistance Equipment used: Rolling walker (2 wheeled) Transfers: Sit to/from Stand Sit to Stand: Supervision            Ambulation/Gait Ambulation/Gait assistance: Min guard Ambulation Distance (Feet): 45 Feet Assistive device: Rolling walker (2 wheeled) Gait Pattern/deviations: Step-through pattern     General Gait Details: sats 91%-difficult to register- then increased to 96% on 2 l.  Stairs            Wheelchair Mobility    Modified Rankin (Stroke Patients Only)       Balance                                             Pertinent Vitals/Pain Pain Assessment: No/denies pain     Home Living Family/patient expects to be discharged to:: Private residence Living Arrangements: Spouse/significant other Available Help at Discharge: Family;Available 24 hours/day Type of Home: House Home Access: Level entry       Home Equipment: Walker - 2 wheels      Prior Function                 Hand Dominance        Extremity/Trunk Assessment        Lower Extremity Assessment Lower Extremity Assessment: Generalized weakness       Communication      Cognition Arousal/Alertness: Awake/alert Behavior During Therapy: WFL for tasks assessed/performed Overall Cognitive Status: Within Functional Limits for tasks assessed                                        General Comments      Exercises     Assessment/Plan    PT Assessment Patient needs continued PT services  PT Problem List Decreased activity tolerance;Decreased mobility;Cardiopulmonary status limiting activity       PT Treatment Interventions DME instruction;Gait training;Functional mobility training;Therapeutic activities;Therapeutic exercise;Patient/family education    PT Goals (Current goals can be found in  the Care Plan section)  Acute Rehab PT Goals Patient Stated Goal: to go home PT Goal Formulation: With patient/family Time For Goal Achievement: 09/22/16 Potential to Achieve Goals: Good    Frequency Min 3X/week   Barriers to discharge        Co-evaluation               AM-PAC PT "6 Clicks" Daily Activity  Outcome Measure Difficulty turning over in bed (including adjusting bedclothes, sheets and blankets)?: None Difficulty moving from lying on back to sitting on the side of the bed? : None Difficulty sitting down on and standing up from a chair with arms (e.g., wheelchair, bedside commode, etc,.)?: A Little Help needed moving to and from a bed to chair (including a wheelchair)?: A Little Help needed walking in hospital room?: A Little Help needed  climbing 3-5 steps with a railing? : A Little 6 Click Score: 20    End of Session Equipment Utilized During Treatment: Oxygen Activity Tolerance: Patient tolerated treatment well Patient left: in chair;with call bell/phone within reach;with family/visitor present Nurse Communication: Mobility status PT Visit Diagnosis: Difficulty in walking, not elsewhere classified (R26.2)    Time: [id number] PT Time Calculation (min) (ACUTE ONLY): 30 min   Charges:   PT Evaluation $PT Eval Low Complexity: 1 Procedure PT Treatments $Gait Training: 8-22 mins   PT G CodesBlanchard Kelch:        Damond Borchers PT [phone]   Rada HayHill, Saya Mccoll Elizabeth 09/08/2016, 11:20 AM

## 2016-09-08 NOTE — Discharge Summary (Signed)
Physician Discharge Summary  [name]  ZOX:[id number]  DOB: [birth year]  DOA: 09/05/2016 PCP: Nila Nephew, MD  Admit date: 09/05/2016 Discharge date: 09/08/2016  Admitted From: Home  Disposition:  Home  Recommendations for Outpatient Follow-up:  1. Follow up with PCP in 1-2 weeks 2. Please obtain BMP/CBC in one week to monitor Cr and Hgb  3. Follow up with Pulmonologist   Home Health: PT/RN Equipment/Devices: O2 2L [place]  Discharge Condition: Improved  CODE STATUS: DNR  Diet recommendation: Heart Healthy    Brief/Interim Summary: [name] 83 year old female with medical history of COPD on 2 L nasal cannula at home, chronic diastolic failure, paroxysmal A. fib and flutter, chronic kidney disease stage III presented to the ED with progressive shortness of breath and dry cough. In the ED she was found to have tachypnea with oxygen levels down to the 80s, chest x-ray demonstrated mild left base atelectasis with small pleural effusions. Patient admitted for acute on chronic hypoxemic respiratory failure due to COPD exacerbation, possible pneumonia and possible CHF exacerbation.SHe was treated with Solumedrol, nebs, abx and lasix. Patient subsequently clinically improved and was discharge on oral abx. Patient was evaluated by PT who recommended SNF for physical rehab but patient declined. Patient was discharged home with HHPT. For full details of hospital stay see H&P, progress and consults notes.   Subjective: Patient seen and examined on the day of discharge, she is feeling much better today. Breathing is back to baseline. She ambulate with no issues and kept oxygen saturation above 90% on her baseline O2 supplement. Remains afebrile during hospital stay. No acute events overnight.   Discharge Diagnoses/Hospital Course:  Acute on chronic hypoxemic respiratory failure, likely secondary to COPD exacerbation and pneumonia. On x-ray reviewed by me right base infiltrate consistent with rales  in physical exam. Unlikely to be CHF, no edema after giving Lasix creatinine increasing meaning patient is dry BNP was mildly elevated, could be 2/2 to A. fib  Patient was started on 4L of O2 which susscefully was weaned to her 2L [place] with no desaturation. Patient was treated with IV abx and steroids.  CAP Initially treated with Ctx and Azithromycin. Patient completed Azithromycin course 500 mg x 3 days  She was discharged on Ventin to complete total of 7 days of antibiotics  Repeated CXR more consistent with RLL PNA, clinical findings also consistent.  Pro-calcitonin 0.24 Leukocytosis - improving, expect to decrease slowly with prednisone taper   COPD exacerbation due to CAP  Patient was given a prednisone burst with 40 mg will taper over 14 days.  Patient became tachycardic after albuterol nebs treatment she was changed to Xopenex TID  She was treated with Atrovent  As she is not currently wheezing she was advised to continue albuterol inhaler PRN  Continue Advair BID  O2 supplementation at 2L    Acute renal failure on chronic kidney disease stage III, baseline creatinine of 1.7 Creatinine trended up to 2.0 likely due to aggressive diuresis She was given 1 bolus NS and Cr upon discharge was 1.64   Encourage oral hydration Check BMP in 1 week   Atrial flutter/Afib  EKG consistent with flutter, no specific ST wave abnormalities Patient asymptomatic Troponin negative 4 Continue Cardizem and Eliquis for anticoagulation  All other chronic medical condition were stable during the hospitalization.  Patient was seen by physical therapy, recommending SNF but patient declined. Patient was d/c with [facility] PT/RN.  On the day of the discharge the patient's vitals were  stable, and no other acute medical condition were reported by patient. Patient was felt safe to be discharge to home  Discharge Instructions  You were cared for by a hospitalist during your hospital stay. If you have any  questions about your discharge medications or the care you received while you were in the hospital after you are discharged, you can call the unit and asked to speak with the hospitalist on call if the hospitalist that took care of you is not available. Once you are discharged, your primary care physician will handle any further medical issues. Please note that NO REFILLS for any discharge medications will be authorized once you are discharged, as it is imperative that you return to your primary care physician (or establish a relationship with a primary care physician if you do not have one) for your aftercare needs so that they can reassess your need for medications and monitor your lab values.  Discharge Instructions    Call MD for:  difficulty breathing, headache or visual disturbances    Complete by:  As directed    Call MD for:  extreme fatigue    Complete by:  As directed    Call MD for:  hives    Complete by:  As directed    Call MD for:  persistant dizziness or light-headedness    Complete by:  As directed    Call MD for:  persistant nausea and vomiting    Complete by:  As directed    Call MD for:  redness, tenderness, or signs of infection (pain, swelling, redness, odor or green/yellow discharge around incision site)    Complete by:  As directed    Call MD for:  severe uncontrolled pain    Complete by:  As directed    Call MD for:  temperature >100.4    Complete by:  As directed    Diet - low sodium heart healthy    Complete by:  As directed    Increase activity slowly    Complete by:  As directed      Allergies as of 09/08/2016      Reactions   Codeine Sulfate Nausea And Vomiting, Other (See Comments)   headache      Medication List    TAKE these medications   albuterol 108 (90 Base) MCG/ACT inhaler Commonly known as:  PROAIR HFA Inhale 2 puffs into the lungs every 6 (six) hours as needed for wheezing or shortness of breath.   atorvastatin 10 MG tablet Commonly known  as:  LIPITOR Take 5 mg by mouth daily.   benzonatate 100 MG capsule Commonly known as:  TESSALON Take 1 capsule (100 mg total) by mouth 3 (three) times daily as needed for cough.   bimatoprost 0.01 % Soln Commonly known as:  LUMIGAN Place 1 drop into both eyes at bedtime.   cefpodoxime 200 MG tablet Commonly known as:  VANTIN Take 1 tablet (200 mg total) by mouth daily.   cholecalciferol 1000 units tablet Commonly known as:  VITAMIN D Take 1,000 Units by mouth 2 (two) times daily.   diltiazem 120 MG tablet Commonly known as:  CARDIZEM Take 240 mg by mouth daily.   ELIQUIS 2.5 MG Tabs tablet Generic drug:  apixaban Take 2.5 mg by mouth 2 (two) times daily.   feeding supplement (ENSURE ENLIVE) Liqd Take 237 mLs by mouth 2 (two) times daily between meals.   Fluticasone-Salmeterol 250-50 MCG/DOSE Aepb Commonly known as:  ADVAIR Inhale 1 puff into  the lungs 2 (two) times daily.   furosemide 20 MG tablet Commonly known as:  LASIX Take 1 tablet (20 mg total) by mouth daily.   OXYGEN Inhale 2 L into the lungs daily.   potassium chloride 10 MEQ tablet Commonly known as:  K-DUR,KLOR-CON Take 10 mEq by mouth 2 (two) times daily.   predniSONE 10 MG tablet Commonly known as:  DELTASONE Take 4 tablets for 3 days; Take 3 tablets for 4 days; Take 2 tablets for 3 days; Take 1 tablet for 4 days What changed:  how much to take  how to take this  when to take this  additional instructions   tetrahydrozoline 0.05 % ophthalmic solution Place 1 drop into both eyes 4 (four) times daily as needed (For dry eyes.).   traMADol-acetaminophen 37.5-325 MG tablet Commonly known as:  ULTRACET Take 1-2 tablets by mouth 2 (two) times daily as needed for moderate pain.   TYLENOL 500 MG tablet Generic drug:  acetaminophen Take 500-1,000 mg by mouth every 4 (four) hours as needed for mild pain.   vitamin E 1000 UNIT capsule Take 1,000 Units by mouth 2 (two) times daily.       Follow-up Information    Nila Nephew, MD. Schedule an appointment as soon as possible for a visit in 1 week(s).   Specialty:  Internal Medicine Why:  hospital follow up  Contact information: [address] [place] [address] [phone]          Allergies  Allergen Reactions  . Codeine Sulfate Nausea And Vomiting and Other (See Comments)    headache    Consultations:  None    Procedures/Studies: Dg Chest 2 View  Result Date: 09/05/2016 CLINICAL DATA:  Shortness of Breath EXAM: CHEST  2 VIEW COMPARISON:  Chest CT July 29, 2016 ; chest radiograph July 29, 2016 FINDINGS: There is atelectatic change in the left base, mild. Lungs elsewhere are clear. Heart size and pulmonary vascularity are normal. No adenopathy. There is aortic atherosclerosis. No bone lesions. IMPRESSION: Mild left base atelectasis. No edema or consolidation. Previously noted small pleural effusions are no longer appreciable by radiography. There is aortic atherosclerosis. Cardiac silhouette is stable. Electronically Signed   By: Bretta Bang III M.D.   On: 09/05/2016 14:24   Dg Chest Port 1 View  Result Date: 09/07/2016 CLINICAL DATA:  Pneumonia. EXAM: PORTABLE CHEST 1 VIEW COMPARISON:  09/06/2011.  07/30/2011 FINDINGS: Worsening of lung density in the lower chest since 2 days ago. Radiographically, the pattern is more suggestive of small effusions and interstitial edema, but pneumonia is possible. Aortic atherosclerosis again noted. IMPRESSION: Worsening of density in the lower chest most consistent with small effusions and interstitial edema. Basilar pneumonia not excluded. Electronically Signed   By: Paulina Fusi M.D.   On: 09/07/2016 07:14    Discharge Exam: Vitals:   09/07/16 2214 09/08/16 0551  BP: (!) 155/69 (!) 143/78  Pulse: 88 85  Resp: (!) 22 20  Temp: 97.9 F (36.6 C) 97.8 F (36.6 C)   Vitals:   09/07/16 2147 09/07/16 2214 09/08/16 0551 09/08/16 0815  BP:  (!)  155/69 (!) 143/78   Pulse:  88 85   Resp:  (!) 22 20   Temp:  97.9 F (36.6 C) 97.8 F (36.6 C)   TempSrc:  Oral Oral   SpO2: 94% 96% 96% 95%  Weight:   51.6 kg (113 lb 12.1 oz)   Height:  General: Pt is alert, awake, not in acute distress Cardiovascular: RRR, S1/S2 +, no rubs, no gallops Respiratory: Air entry has improved, RLL rales improving, no wheezing or crackles  Abdominal: Soft, NT, ND, bowel sounds + Extremities: no edema, no cyanosis   The results of significant diagnostics from this hospitalization (including imaging, microbiology, ancillary and laboratory) are listed below for reference.    Labs: BNP (last 3 results)  Recent Labs  04/15/16 0609 07/29/16 0941 09/05/16 1919  BNP 283.2* 218.4* 416.5*   Basic Metabolic Panel:  Recent Labs Lab 09/05/16 1208 09/06/16 0108 09/07/16 0511 09/08/16 0527  NA 138 136 141 141  K 4.0 4.3 3.8 5.1  CL 104 101 103 104  CO2 25 22 27 27   GLUCOSE 108* 185* 112* 106*  BUN 37* 37* 43* 42*  CREATININE 1.74* 2.01* 1.84* 1.64*  CALCIUM 8.9 8.8* 9.0 9.1   CBC:  Recent Labs Lab 09/05/16 1208 09/07/16 0511 09/08/16 0527  WBC 14.9* 17.5* 15.0*  NEUTROABS  --   --  12.8*  HGB 14.1 12.8 12.0  HCT 42.5 38.8 36.7  MCV 91.8 91.3 91.5  PLT 276 325 307   Cardiac Enzymes:  Recent Labs Lab 09/05/16 1208 09/05/16 1919 09/06/16 0108 09/06/16 0805  TROPONINI <0.03 <0.03 <0.03 <0.03   Urinalysis    Component Value Date/Time   COLORURINE STRAW (A) 04/14/2016 1345   APPEARANCEUR CLEAR 04/14/2016 1345   LABSPEC 1.003 (L) 04/14/2016 1345   PHURINE 6.0 04/14/2016 1345   GLUCOSEU NEGATIVE 04/14/2016 1345   HGBUR NEGATIVE 04/14/2016 1345   BILIRUBINUR NEGATIVE 04/14/2016 1345   KETONESUR NEGATIVE 04/14/2016 1345   PROTEINUR NEGATIVE 04/14/2016 1345   NITRITE NEGATIVE 04/14/2016 1345   LEUKOCYTESUR NEGATIVE 04/14/2016 1345     Time coordinating discharge: 35 minutes  SIGNED:  Latrelle DodrillEdwin Silva, MD  [facility] 09/08/2016, 7:32 PM  Pager please text page via  [email or web address] Password [email or web address]

## 2016-09-08 NOTE — Evaluation (Signed)
Occupational Therapy Evaluation Patient Details Name: [name] MRN: [id number] DOB: November 11, 1927 Today's Date: 09/08/2016    History of Present Illness 83yo female with onset of SOB, acute respiratory failure from CHF and recent PNA, now admitted with increased SOB, on 2L O2 at home.  PMHx:  PAD, a-flutter, CHF, COPD, CKD, EF 55%   Clinical Impression   Pt admitted with SOB . Pt currently with functional limitations due to the deficits listed below (see OT Problem List). Pt will benefit from skilled OT to increase their safety and independence with ADL and functional mobility for ADL to facilitate discharge to venue listed below.    Follow Up Recommendations  Home health OT;Supervision/Assistance - 24 hour - Pt would benefit from OT at ALF upon moving to address ADL activity in new environment   Equipment Recommendations  None recommended by OT       Precautions / Restrictions Precautions Precaution Comments: monitor sats Restrictions Weight Bearing Restrictions: No      Mobility Bed Mobility Overal bed mobility: Independent                Transfers Overall transfer level: Needs assistance Equipment used: Rolling walker (2 wheeled) Transfers: Sit to/from Stand Sit to Stand: Min guard         General transfer comment: pt fatigued post PT per pt        ADL either performed or assessed with clinical judgement   ADL Overall ADL's : Needs assistance/impaired Eating/Feeding: Set up;Sitting   Grooming: Set up;Sitting   Upper Body Bathing: Set up;Sitting   Lower Body Bathing: Minimal assistance;Sit to/from stand;Cueing for safety;Cueing for sequencing;Cueing for compensatory techniques   Upper Body Dressing : Set up;Sitting   Lower Body Dressing: Minimal assistance;Sit to/from stand;Cueing for safety;Cueing for sequencing   Toilet Transfer: Minimal assistance;BSC;Cueing for safety;Cueing for sequencing   Toileting- Clothing Manipulation and Hygiene:  Minimal assistance;Sit to/from stand         General ADL Comments: Educated regarding energy conservation with ADL activity including needs at new ILF. Pt may benefit from a transport chair in which she could push like a walker then ride with husband pushing her.       Vision Patient Visual Report: No change from baseline              Pertinent Vitals/Pain Pain Assessment: No/denies pain     Hand Dominance     Extremity/Trunk Assessment Upper Extremity Assessment Upper Extremity Assessment: Generalized weakness   Lower Extremity Assessment Lower Extremity Assessment: Generalized weakness       Communication Communication Communication: HOH   Cognition Arousal/Alertness: Awake/alert Behavior During Therapy: WFL for tasks assessed/performed Overall Cognitive Status: Within Functional Limits for tasks assessed                                     General Comments   Energy Conservation handout provided.              Home Living Family/patient expects to be discharged to:: Private residence Living Arrangements: Spouse/significant other Available Help at Discharge: Family;Available 24 hours/day Type of Home: House Home Access: Level entry     Home Layout: One level         Bathroom Toilet: Standard     Home Equipment: Walker - 2 wheels          Prior Functioning/Environment Level of Independence: Independent  OT Problem List: Decreased strength;Decreased activity tolerance;Decreased knowledge of use of DME or AE;Cardiopulmonary status limiting activity      OT Treatment/Interventions: Self-care/ADL training;Patient/family education;DME and/or AE instruction    OT Goals(Current goals can be found in the care plan section) Acute Rehab OT Goals Patient Stated Goal: to go home OT Goal Formulation: With patient Time For Goal Achievement: 09/22/16 Potential to Achieve Goals: Good  OT Frequency: Min 2X/week               AM-PAC PT "6 Clicks" Daily Activity     Outcome Measure Help from another person eating meals?: None   Help from another person toileting, which includes using toliet, bedpan, or urinal?: A Little Help from another person bathing (including washing, rinsing, drying)?: A Little Help from another person to put on and taking off regular upper body clothing?: A Little Help from another person to put on and taking off regular lower body clothing?: A Little 6 Click Score: 16   End of Session Equipment Utilized During Treatment: Rolling walker Nurse Communication: Mobility status  Activity Tolerance: Patient limited by fatigue Patient left: in chair;with call bell/phone within reach  OT Visit Diagnosis: Unsteadiness on feet (R26.81);Muscle weakness (generalized) (M62.81)                Time: 6962-9528 OT Time Calculation (min): 19 min Charges:  OT General Charges $OT Visit: 1 Procedure OT Evaluation $OT Eval Moderate Complexity: 1 Procedure G-Codes:     Lise Auer, OT [phone]  Einar Crow D 09/08/2016, 12:54 PM

## 2016-09-08 NOTE — Progress Notes (Signed)
Pt selected [facility] for HH needs. Referral given to in house rep.  

## 2016-09-08 NOTE — Progress Notes (Signed)
Met with pt to discuss recommendation for SNF by PT. Pt and husband report plan to move from their private residence to independent living at [facility] at the end of this month. Pt adamantly declines SNF placement stating she prefers to go home and look into having HHPT address therapy needs.  Discussed SNF option at length with pt and husband.  Pt and husband agree that pt will return home at DC. CM following as well.  Sharren Bridge, MSW, LCSW Clinical Social Work 09/08/2016 [phone]

## 2016-09-17 ENCOUNTER — Encounter: Payer: Self-pay | Admitting: Adult Health

## 2016-09-17 ENCOUNTER — Ambulatory Visit (INDEPENDENT_AMBULATORY_CARE_PROVIDER_SITE_OTHER): Payer: Medicare Other | Admitting: Adult Health

## 2016-09-17 ENCOUNTER — Ambulatory Visit (INDEPENDENT_AMBULATORY_CARE_PROVIDER_SITE_OTHER)
Admission: RE | Admit: 2016-09-17 | Discharge: 2016-09-17 | Disposition: A | Discharge status: Home / Self Care | Payer: Medicare Other | Source: Ambulatory Visit | Attending: Adult Health | Admitting: Adult Health

## 2016-09-17 DIAGNOSIS — J449 Chronic obstructive pulmonary disease, unspecified: Secondary | ICD-10-CM | POA: Diagnosis not present

## 2016-09-17 DIAGNOSIS — J189 Pneumonia, unspecified organism: Secondary | ICD-10-CM | POA: Diagnosis not present

## 2016-09-17 DIAGNOSIS — I779 Disorder of arteries and arterioles, unspecified: Secondary | ICD-10-CM | POA: Diagnosis not present

## 2016-09-17 DIAGNOSIS — J9611 Chronic respiratory failure with hypoxia: Secondary | ICD-10-CM | POA: Diagnosis not present

## 2016-09-17 NOTE — Addendum Note (Signed)
Addended by: Boone MasterJONES, JESSICA E on: 09/17/2016 12:57 PM   Modules accepted: Orders

## 2016-09-17 NOTE — Assessment & Plan Note (Signed)
Check cxr today  Clinically improved

## 2016-09-17 NOTE — Progress Notes (Signed)
@Patient  ID: [name], female    DOB: [birth year], 83 y.o.   MRN: [id number]  Chief Complaint  Patient presents with  . Follow-up    Referring provider: Nila Nephew, MD  HPI: 83 yo female former smoker seen for pulmonary consult during hospitalization on 04/16/16 for CAP   TEST  Echo 1/09 /18>> EF 55 to 60%, PAS 33 mmHg Spirometry 1/11 >> FVC 1.93 (86%), FEV1% 1.15 (70%), FEV1% 60  09/17/2016 Follow up : COPD  Patient presents for a post hospital follow-up. Patient was admitted earlier this month for COPD exacerbation, pneumonia. She was treated with IV antibiotics, steroids and nebulized bronchodilators.  Since discharge. Patient is feeling better with less cough and congestion .  Does not have any prescription coverage. She was on Advair . Now taking INCRUSE (says it is more affordable at her pharmacy . Denies chest pain ,orthopnea or edema.   PT is coming to her home now.  Moving to [facility] at the end of month.     Allergies  Allergen Reactions  . Codeine Sulfate Nausea And Vomiting and Other (See Comments)    headache    Immunization History  Administered Date(s) Administered  . Influenza, High Dose Seasonal PF 01/05/2016  . Influenza-Unspecified 12/06/2013  . Pneumococcal Polysaccharide-23 04/07/1994    Past Medical History:  Diagnosis Date  . Anticoagulated    Eliquis  . Atrial flutter (HCC)    NO CARDIOLOGIST FOLLOWED BY DR Tori Milks  . Bloody stools    LAST 4 DAYS  . CHF (congestive heart failure) (HCC)   . Chronic back pain   . CKD (chronic kidney disease), stage III    Creatinine 1.35 -1.55  . COPD (chronic obstructive pulmonary disease) (HCC)   . Diverticulosis   . Dyspnea   . Dysrhythmia   . Edema   . Gait disorder   . Glaucoma    Severe  . Glaucoma    both eyes  . High cholesterol   . Multinodular goiter   . PAOD (peripheral arterial occlusive disease) (HCC)    Stein in right groin, decrease pulses  . PMR (polymyalgia  rheumatica) (HCC)   . Pneumonia, organism unspecified(486) 04/18/2016  . Protein-calorie malnutrition (HCC)   . Rash of back   . Weakness     Tobacco History: History  Smoking Status  . Former Smoker  . Types: Cigarettes  . Quit date: 07/12/1993  Smokeless Tobacco  . Never Used   Counseling given: Not Answered   Outpatient Encounter Prescriptions as of 09/17/2016  Medication Sig  . acetaminophen (TYLENOL) 500 MG tablet Take 500-1,000 mg by mouth every 4 (four) hours as needed for mild pain.   Marland Kitchen albuterol (PROAIR HFA) 108 (90 Base) MCG/ACT inhaler Inhale 2 puffs into the lungs every 6 (six) hours as needed for wheezing or shortness of breath.  Marland Kitchen atorvastatin (LIPITOR) 10 MG tablet Take 5 mg by mouth daily.   . benzonatate (TESSALON) 100 MG capsule Take 1 capsule (100 mg total) by mouth 3 (three) times daily as needed for cough.  . bimatoprost (LUMIGAN) 0.01 % SOLN Place 1 drop into both eyes at bedtime.  . cholecalciferol (VITAMIN D) 1000 UNITS tablet Take 1,000 Units by mouth 2 (two) times daily.   Marland Kitchen diltiazem (CARDIZEM) 120 MG tablet Take 240 mg by mouth daily.   Marland Kitchen ELIQUIS 2.5 MG TABS tablet Take 2.5 mg by mouth 2 (two) times daily.  . feeding supplement, ENSURE ENLIVE, (ENSURE ENLIVE) LIQD  Take 237 mLs by mouth 2 (two) times daily between meals.  . Fluticasone-Salmeterol (ADVAIR) 250-50 MCG/DOSE AEPB Inhale 1 puff into the lungs 2 (two) times daily.  . furosemide (LASIX) 20 MG tablet Take 1 tablet (20 mg total) by mouth daily.  . OXYGEN Inhale 2 L into the lungs daily.  . potassium chloride (K-DUR,KLOR-CON) 10 MEQ tablet Take 10 mEq by mouth 2 (two) times daily.  . predniSONE (DELTASONE) 10 MG tablet Take 4 tablets for 3 days; Take 3 tablets for 4 days; Take 2 tablets for 3 days; Take 1 tablet for 4 days  . tetrahydrozoline 0.05 % ophthalmic solution Place 1 drop into both eyes 4 (four) times daily as needed (For dry eyes.).  Marland Kitchen traMADol-acetaminophen (ULTRACET) 37.5-325 MG per  tablet Take 1-2 tablets by mouth 2 (two) times daily as needed for moderate pain.   . vitamin E 1000 UNIT capsule Take 1,000 Units by mouth 2 (two) times daily.    No facility-administered encounter medications on file as of 09/17/2016.      Review of Systems  Constitutional:   No  weight loss, night sweats,  Fevers, chills, fatigue, or  lassitude.  HEENT:   No headaches,  Difficulty swallowing,  Tooth/dental problems, or  Sore throat,                No sneezing, itching, ear ache, nasal congestion, post nasal drip,   CV:  No chest pain,  Orthopnea, PND, swelling in lower extremities, anasarca, dizziness, palpitations, syncope.   GI  No heartburn, indigestion, abdominal pain, nausea, vomiting, diarrhea, change in bowel habits, loss of appetite, bloody stools.   Resp: No shortness of breath with exertion or at rest.  No excess mucus, no productive cough,  No non-productive cough,  No coughing up of blood.  No change in color of mucus.  No wheezing.  No chest wall deformity  Skin: no rash or lesions.  GU: no dysuria, change in color of urine, no urgency or frequency.  No flank pain, no hematuria   MS:  No joint pain or swelling.  No decreased range of motion.  No back pain.    Physical Exam  BP 110/74 (BP Location: Left Arm, Cuff Size: Normal)   Pulse 100   Ht 5' 3.5" (1.613 m)   Wt 108 lb 6.4 oz (49.2 kg)   SpO2 100%   BMI 18.90 kg/m   GEN: A/Ox3; pleasant , NAD, well nourished    HEENT:  [place]/AT,  EACs-clear, TMs-wnl, NOSE-clear, THROAT-clear, no lesions, no postnasal drip or exudate noted.   NECK:  Supple w/ fair ROM; no JVD; normal carotid impulses w/o bruits; no thyromegaly or nodules palpated; no lymphadenopathy.    RESP  Clear  P & A; w/o, wheezes/ rales/ or rhonchi. no accessory muscle use, no dullness to percussion  CARD:  RRR, no m/r/g, no peripheral edema, pulses intact, no cyanosis or clubbing.  GI:   Soft & nt; nml bowel sounds; no organomegaly or masses  detected.   Musco: Warm bil, no deformities or joint swelling noted.   Neuro: alert, no focal deficits noted.    Skin: Warm, no lesions or rashes  Psych:  No change in mood or affect. No depression or anxiety.  No memory loss.  Lab Results:  CBC    Component Value Date/Time   WBC 15.0 (H) 09/08/2016 0527   RBC 4.01 09/08/2016 0527   HGB 12.0 09/08/2016 0527   HCT 36.7 09/08/2016 0527  PLT 307 09/08/2016 0527   MCV 91.5 09/08/2016 0527   MCH 29.9 09/08/2016 0527   MCHC 32.7 09/08/2016 0527   RDW 14.4 09/08/2016 0527   LYMPHSABS 0.7 09/08/2016 0527   MONOABS 1.5 (H) 09/08/2016 0527   EOSABS 0.0 09/08/2016 0527   BASOSABS 0.0 09/08/2016 0527    BMET    Component Value Date/Time   NA 141 09/08/2016 0527   NA 143 08/28/2016 1405   K 5.1 09/08/2016 0527   CL 104 09/08/2016 0527   CO2 27 09/08/2016 0527   GLUCOSE 106 (H) 09/08/2016 0527   BUN 42 (H) 09/08/2016 0527   BUN 24 08/28/2016 1405   CREATININE 1.64 (H) 09/08/2016 0527   CALCIUM 9.1 09/08/2016 0527   GFRNONAA 27 (L) 09/08/2016 0527   GFRAA 31 (L) 09/08/2016 0527    BNP    Component Value Date/Time   BNP 416.5 (H) 09/05/2016 1919    ProBNP No results found for: PROBNP  Imaging: Dg Chest 2 View  Result Date: 09/05/2016 CLINICAL DATA:  Shortness of Breath EXAM: CHEST  2 VIEW COMPARISON:  Chest CT July 29, 2016 ; chest radiograph July 29, 2016 FINDINGS: There is atelectatic change in the left base, mild. Lungs elsewhere are clear. Heart size and pulmonary vascularity are normal. No adenopathy. There is aortic atherosclerosis. No bone lesions. IMPRESSION: Mild left base atelectasis. No edema or consolidation. Previously noted small pleural effusions are no longer appreciable by radiography. There is aortic atherosclerosis. Cardiac silhouette is stable. Electronically Signed   By: Bretta BangWilliam  Woodruff III M.D.   On: 09/05/2016 14:24   Dg Chest Port 1 View  Result Date: 09/07/2016 CLINICAL DATA:  Pneumonia.  EXAM: PORTABLE CHEST 1 VIEW COMPARISON:  09/06/2011.  07/30/2011 FINDINGS: Worsening of lung density in the lower chest since 2 days ago. Radiographically, the pattern is more suggestive of small effusions and interstitial edema, but pneumonia is possible. Aortic atherosclerosis again noted. IMPRESSION: Worsening of density in the lower chest most consistent with small effusions and interstitial edema. Basilar pneumonia not excluded. Electronically Signed   By: Paulina FusiMark  Shogry M.D.   On: 09/07/2016 07:14     Assessment & Plan:   No problem-specific Assessment & Plan notes found for this encounter.     Rubye Oaksammy Jakory Matsuo, NP 09/17/2016

## 2016-09-17 NOTE — Assessment & Plan Note (Signed)
Continue on oxygen . To keep o2 >88-90%.

## 2016-09-17 NOTE — Assessment & Plan Note (Signed)
Recent flare now resolving  Would like to triple therapy but does not have rx coverage. For now stay on INCRUSE  Can look at nebs on return .   Plan  Patient Instructions  Chest x-ray today. Continue on Oxygen .  Continue on INCRUSE 1 puff daily . Rinse after use.  follow up Dr. Craige CottaSood  In 6-8 weeks and As needed   Please contact office for sooner follow up if symptoms do not improve or worsen or seek emergency care

## 2016-09-17 NOTE — Patient Instructions (Addendum)
Chest x-ray today. Continue on Oxygen .  Continue on INCRUSE 1 puff daily . Rinse after use.  follow up Dr. Craige CottaSood  In 6-8 weeks and As needed   Please contact office for sooner follow up if symptoms do not improve or worsen or seek emergency care

## 2016-09-18 ENCOUNTER — Telehealth: Payer: Self-pay | Admitting: Adult Health

## 2016-09-18 NOTE — Progress Notes (Signed)
LMTCB

## 2016-09-18 NOTE — Progress Notes (Signed)
I have reviewed and agree with assessment/plan.  Coralyn HellingVineet Cleon Thoma, MD [facility] 09/18/2016, 1:04 AM Pager:  [phone]

## 2016-09-18 NOTE — Telephone Encounter (Signed)
No sign of PNA  Chronic changes (prev CT chest 07/2016 w/ mod to severe emphysema )  Cont w/ ov recs  .Please contact office for sooner follow up if symptoms do not improve or worsen or seek emergency care   Spoke with pt and [facility] of [facility] per [facility]. Pt verbalized understanding and denied any questions.

## 2016-09-18 NOTE — Progress Notes (Signed)
See PN dated 09/18/16

## 2016-10-29 ENCOUNTER — Ambulatory Visit: Payer: Medicare Other | Admitting: Adult Health

## 2016-11-20 ENCOUNTER — Ambulatory Visit (INDEPENDENT_AMBULATORY_CARE_PROVIDER_SITE_OTHER): Payer: Medicare Other | Admitting: Adult Health

## 2016-11-20 ENCOUNTER — Encounter: Payer: Self-pay | Admitting: Adult Health

## 2016-11-20 DIAGNOSIS — J449 Chronic obstructive pulmonary disease, unspecified: Secondary | ICD-10-CM | POA: Diagnosis not present

## 2016-11-20 DIAGNOSIS — I779 Disorder of arteries and arterioles, unspecified: Secondary | ICD-10-CM | POA: Diagnosis not present

## 2016-11-20 DIAGNOSIS — J9611 Chronic respiratory failure with hypoxia: Secondary | ICD-10-CM

## 2016-11-20 NOTE — Assessment & Plan Note (Signed)
Stable on present regimen   Plan  Patient Instructions  Continue on Oxygen 2 ll/m .   Continue on INCRUSE 1 puff daily . Rinse after use.  follow up Dr. Craige CottaSood  In 3-4 months and As needed   Check with Dr. Chilton SiGreen about your pneumonia vaccines.  Please contact office for sooner follow up if symptoms do not improve or worsen or seek emergency care

## 2016-11-20 NOTE — Patient Instructions (Addendum)
Continue on Oxygen 2 ll/m .   Continue on INCRUSE 1 puff daily . Rinse after use.  follow up Dr. Craige CottaSood  In 3-4 months and As needed   Check with Dr. Chilton SiGreen about your pneumonia vaccines.  Please contact office for sooner follow up if symptoms do not improve or worsen or seek emergency care

## 2016-11-20 NOTE — Progress Notes (Signed)
 @Patient  ID: [name], female    DOB: 07/26/1927, 83 y.o.   MRN: [id number]  Chief Complaint  Patient presents with  . Follow-up    Referring provider: Nila NephewGreen, Edwin, MD  HPI: 83 yo female former smoker seen for pulmonary consult during hospitalization on 04/16/16 for CAP   TEST  Echo 1/09 /18>> EF 55 to 60%, PAS 33 mmHg Spirometry 1/11 >> FVC 1.93 (86%), FEV1% 1.15 (70%), FEV1% 60  11/20/2016 Follow up : COPD  Patient returns for a two-month follow-up. Her overall breathing is doing okay. Remains on INCRUSE . She denies any chest pain, orthopnea, PND, leg swelling or hemoptysis .  Remains on O2 at 2l/m . She gets winded with walking. She is walking more over the last few weeks..  [facility]     Allergies  Allergen Reactions  . Codeine Sulfate Nausea And Vomiting and Other (See Comments)    headache    Immunization History  Administered Date(s) Administered  . Influenza, High Dose Seasonal PF 01/05/2016  . Influenza-Unspecified 12/06/2013  . Pneumococcal Polysaccharide-23 04/07/1994    Past Medical History:  Diagnosis Date  . Anticoagulated    Eliquis  . Atrial flutter (HCC)    NO CARDIOLOGIST FOLLOWED BY DR Tori MilksEDWIN GREENE  . Bloody stools    LAST 4 DAYS  . CHF (congestive heart failure) (HCC)   . Chronic back pain   . CKD (chronic kidney disease), stage III    Creatinine 1.35 -1.55  . COPD (chronic obstructive pulmonary disease) (HCC)   . Diverticulosis   . Dyspnea   . Dysrhythmia   . Edema   . Gait disorder   . Glaucoma    Severe  . Glaucoma    both eyes  . High cholesterol   . Multinodular goiter   . PAOD (peripheral arterial occlusive disease) (HCC)    Stein in right groin, decrease pulses  . PMR (polymyalgia rheumatica) (HCC)   . Pneumonia, organism unspecified(486) 04/18/2016  . Protein-calorie malnutrition (HCC)   . Rash of back   . Weakness     Tobacco History: History  Smoking Status  .  Former Smoker  . Types: Cigarettes  . Quit date: 07/12/1993  Smokeless Tobacco  . Never Used   Counseling given: Not Answered   Outpatient Encounter Prescriptions as of 11/20/2016  Medication Sig  . acetaminophen (TYLENOL) 500 MG tablet Take 500-1,000 mg by mouth every 4 (four) hours as needed for mild pain.   Marland Kitchen. albuterol (PROAIR HFA) 108 (90 Base) MCG/ACT inhaler Inhale 2 puffs into the lungs every 6 (six) hours as needed for wheezing or shortness of breath.  Marland Kitchen. atorvastatin (LIPITOR) 10 MG tablet Take 5 mg by mouth daily.   . benzonatate (TESSALON) 100 MG capsule Take 1 capsule (100 mg total) by mouth 3 (three) times daily as needed for cough.  . bimatoprost (LUMIGAN) 0.01 % SOLN Place 1 drop into both eyes at bedtime.  . cholecalciferol (VITAMIN D) 1000 UNITS tablet Take 1,000 Units by mouth 2 (two) times daily.   Marland Kitchen. diltiazem (CARDIZEM) 120 MG tablet Take 240 mg by mouth daily.   Marland Kitchen. ELIQUIS 2.5 MG TABS tablet Take 2.5 mg by mouth 2 (two) times daily.  . feeding supplement, ENSURE ENLIVE, (ENSURE ENLIVE) LIQD Take 237 mLs by mouth 2 (two) times daily between meals.  . Fluticasone-Salmeterol (ADVAIR) 250-50 MCG/DOSE AEPB Inhale 1 puff into the lungs 2 (two) times daily.  . furosemide (  LASIX) 20 MG tablet Take 1 tablet (20 mg total) by mouth daily.  . OXYGEN Inhale 2 L into the lungs daily.  . potassium chloride (K-DUR,KLOR-CON) 10 MEQ tablet Take 10 mEq by mouth 2 (two) times daily.  Marland Kitchen tetrahydrozoline 0.05 % ophthalmic solution Place 1 drop into both eyes 4 (four) times daily as needed (For dry eyes.).  Marland Kitchen traMADol-acetaminophen (ULTRACET) 37.5-325 MG per tablet Take 1-2 tablets by mouth 2 (two) times daily as needed for moderate pain.   Marland Kitchen umeclidinium bromide (INCRUSE ELLIPTA) 62.5 MCG/INH AEPB Inhale 1 puff into the lungs daily.  . vitamin E 1000 UNIT capsule Take 1,000 Units by mouth 2 (two) times daily.    No facility-administered encounter medications on file as of 11/20/2016.       Review of Systems  Constitutional:   No  weight loss, night sweats,  Fevers, chills, + fatigue, or  lassitude.  HEENT:   No headaches,  Difficulty swallowing,  Tooth/dental problems, or  Sore throat,                No sneezing, itching, ear ache, nasal congestion, post nasal drip,   CV:  No chest pain,  Orthopnea, PND, swelling in lower extremities, anasarca, dizziness, palpitations, syncope.   GI  No heartburn, indigestion, abdominal pain, nausea, vomiting, diarrhea, change in bowel habits, loss of appetite, bloody stools.   Resp:   No chest wall deformity  Skin: no rash or lesions.  GU: no dysuria, change in color of urine, no urgency or frequency.  No flank pain, no hematuria   MS:  No joint pain or swelling.  No decreased range of motion.  No back pain.    Physical Exam  BP 112/70 (BP Location: Left Arm, Patient Position: Sitting, Cuff Size: Normal)   Pulse 66   Ht 5\' 3"  (1.6 m)   Wt 110 lb (49.9 kg)   SpO2 95%   BMI 19.49 kg/m   GEN: A/Ox3; pleasant , NAD , elderly , on O2    HEENT:  [place]/AT,  EACs-clear, TMs-wnl, NOSE-clear, THROAT-clear, no lesions, no postnasal drip or exudate noted.   NECK:  Supple w/ fair ROM; no JVD; normal carotid impulses w/o bruits; no thyromegaly or nodules palpated; no lymphadenopathy.    RESP  Decrease BS in bases , no accessory muscle use, no dullness to percussion  CARD:  RRR, no m/r/g, no peripheral edema, pulses intact, no cyanosis or clubbing.  GI:   Soft & nt; nml bowel sounds; no organomegaly or masses detected.   Musco: Warm bil, no deformities or joint swelling noted.   Neuro: alert, no focal deficits noted.    Skin: Warm, no lesions or rashes    Lab Results:  CBC  BMET  BNP  ProBNP No results found for: PROBNP  Imaging: No results found.   Assessment & Plan:   COPD (chronic obstructive pulmonary disease) (HCC) Stable on present regimen   Plan  Patient Instructions  Continue on Oxygen 2 ll/m .    Continue on INCRUSE 1 puff daily . Rinse after use.  follow up Dr. Craige Cotta  In 3-4 months and As needed   Check with Dr. Chilton Si about your pneumonia vaccines.  Please contact office for sooner follow up if symptoms do not improve or worsen or seek emergency care        Chronic respiratory failure with hypoxia (HCC) Cont on O2      Kaiyan Luczak, NP 11/20/2016

## 2016-11-20 NOTE — Assessment & Plan Note (Signed)
Cont on O2 .  

## 2016-11-23 NOTE — Progress Notes (Signed)
I have reviewed and agree with assessment/plan.  Coralyn Helling, MD [facility]/Critical Care 11/23/2016, 11:06 AM Pager:  [phone]

## 2016-12-30 ENCOUNTER — Ambulatory Visit (INDEPENDENT_AMBULATORY_CARE_PROVIDER_SITE_OTHER): Payer: Medicare Other | Admitting: Pulmonary Disease

## 2016-12-30 ENCOUNTER — Encounter: Payer: Self-pay | Admitting: Pulmonary Disease

## 2016-12-30 VITALS — BP 118/80 | HR 98 | Ht 63.0 in | Wt 110.0 lb

## 2016-12-30 DIAGNOSIS — J441 Chronic obstructive pulmonary disease with (acute) exacerbation: Secondary | ICD-10-CM | POA: Diagnosis not present

## 2016-12-30 DIAGNOSIS — I779 Disorder of arteries and arterioles, unspecified: Secondary | ICD-10-CM | POA: Diagnosis not present

## 2016-12-30 DIAGNOSIS — J432 Centrilobular emphysema: Secondary | ICD-10-CM

## 2016-12-30 DIAGNOSIS — J9611 Chronic respiratory failure with hypoxia: Secondary | ICD-10-CM | POA: Diagnosis not present

## 2016-12-30 MED ORDER — DOXYCYCLINE HYCLATE 100 MG PO TABS: 100.0000 mg | ORAL_TABLET | Freq: Two times a day (BID) | ORAL | 0 refills | Status: DC

## 2016-12-30 NOTE — Progress Notes (Signed)
Current Outpatient Prescriptions on File Prior to Visit  Medication Sig  . acetaminophen (TYLENOL) 500 MG tablet Take 500-1,000 mg by mouth every 4 (four) hours as needed for mild pain.   Marland Kitchen albuterol (PROAIR HFA) 108 (90 Base) MCG/ACT inhaler Inhale 2 puffs into the lungs every 6 (six) hours as needed for wheezing or shortness of breath.  Marland Kitchen atorvastatin (LIPITOR) 10 MG tablet Take 5 mg by mouth daily.   . benzonatate (TESSALON) 100 MG capsule Take 1 capsule (100 mg total) by mouth 3 (three) times daily as needed for cough.  . bimatoprost (LUMIGAN) 0.01 % SOLN Place 1 drop into both eyes at bedtime.  . cholecalciferol (VITAMIN D) 1000 UNITS tablet Take 1,000 Units by mouth 2 (two) times daily.   Marland Kitchen diltiazem (CARDIZEM) 120 MG tablet Take 240 mg by mouth daily.   Marland Kitchen ELIQUIS 2.5 MG TABS tablet Take 2.5 mg by mouth 2 (two) times daily.  . feeding supplement, ENSURE ENLIVE, (ENSURE ENLIVE) LIQD Take 237 mLs by mouth 2 (two) times daily between meals.  . Fluticasone-Salmeterol (ADVAIR) 250-50 MCG/DOSE AEPB Inhale 1 puff into the lungs 2 (two) times daily.  . furosemide (LASIX) 20 MG tablet Take 1 tablet (20 mg total) by mouth daily.  . OXYGEN Inhale 2 L into the lungs daily.  . potassium chloride (K-DUR,KLOR-CON) 10 MEQ tablet Take 10 mEq by mouth 2 (two) times daily.  Marland Kitchen tetrahydrozoline 0.05 % ophthalmic solution Place 1 drop into both eyes 4 (four) times daily as needed (For dry eyes.).  Marland Kitchen traMADol-acetaminophen (ULTRACET) 37.5-325 MG per tablet Take 1-2 tablets by mouth 2 (two) times daily as needed for moderate pain.   Marland Kitchen umeclidinium bromide (INCRUSE ELLIPTA) 62.5 MCG/INH AEPB Inhale 1 puff into the lungs daily.  . vitamin E 1000 UNIT capsule Take 1,000 Units by mouth 2 (two) times daily.    No current facility-administered medications on file prior to visit.      Chief Complaint  Patient presents with  . Follow-up    Pt 2 liters con't DME - Adult Peds. Pt states breathing has not gotten any  better, SOB more with extersion. When speaking with pt during visit she is winded, and SOB     Pulmonary tests Spirometry 04/17/16 >> FEV1 1.15 (70%), FEV1% 60 Ambulatory oximetry 07/18/16 >> 82% after 2 laps on room air CT chest 07/29/16 >> marked emphysema, CM, atherosclerosis  Cardiac tests Echo 07/30/16 >> EF 55 to 60%, PAS 34 mmHg  Past medical history A flutter, CKD 3, Glaucoma, HLD, PMR  Past surgical history, Family history, Social history, Allergies all reviewed.  Vital Signs BP 118/80 (BP Location: Left Arm, Cuff Size: Normal)   Pulse 98   Ht  (1.6 m)   Wt 110 lb (49.9 kg)   SpO2 92%   BMI 19.49 kg/m   History of Present Illness [name] is a 83 y.o. female former smoker with COPD.  She has noticed more trouble with her breathing over the past few weeks, but especially since yesterday.  Her husband thinks she was feverish yesterday.  She has some cough.  She is not having sinus congestion, sputum, chest pain, or leg swelling.  She is okay at rest, but her breathing gets bad quickly with activity.  She recovers after resting again for few minutes.  Physical Exam  General - pleasant, thin, wearing oxygen Eyes - pupils reactive ENT - no sinus tenderness, no oral exudate, no LAN Cardiac - regular, no murmur  Chest - coarse breath sounds b/l Abd - soft, non tender Ext - no edema, decreased muscle bulk Skin - no rashes Neuro - normal strength Psych - normal mood  Assessment/Plan  Acute COPD exacerbation. - will give course of doxycycline - if no improvement, then add prednisone and consider CXR and labs  COPD with emphysema. - continue incruse, advair, and albuterol  Chronic respiratory failure with hypoxia. - continue 2 liters oyxgen   Patient Instructions  Doxycycline 100 mg pill twice per day  Call if not feeling better  Follow up in 4 months    Coralyn Helling, MD [place] [facility] Pager:  [phone] 12/30/2016, 12:26  PM

## 2016-12-30 NOTE — Patient Instructions (Signed)
Doxycycline 100 mg pill twice per day  Call if not feeling better  Follow up in 4 months

## 2016-12-31 ENCOUNTER — Emergency Department (HOSPITAL_COMMUNITY)
Admission: EM | Admit: 2016-12-31 | Discharge: 2016-12-31 | Disposition: A | Discharge status: Home / Self Care | Payer: Medicare Other | Attending: Emergency Medicine | Admitting: Emergency Medicine

## 2016-12-31 ENCOUNTER — Telehealth: Payer: Self-pay | Admitting: Pulmonary Disease

## 2016-12-31 ENCOUNTER — Telehealth: Payer: Self-pay | Admitting: Cardiology

## 2016-12-31 ENCOUNTER — Emergency Department (HOSPITAL_COMMUNITY): Payer: Medicare Other

## 2016-12-31 ENCOUNTER — Encounter (HOSPITAL_COMMUNITY): Payer: Self-pay | Admitting: Nurse Practitioner

## 2016-12-31 DIAGNOSIS — Z5321 Procedure and treatment not carried out due to patient leaving prior to being seen by health care provider: Secondary | ICD-10-CM | POA: Diagnosis not present

## 2016-12-31 DIAGNOSIS — R0602 Shortness of breath: Secondary | ICD-10-CM | POA: Diagnosis not present

## 2016-12-31 LAB — BASIC METABOLIC PANEL
Anion gap: 11 (ref 5–15)
BUN: 26 mg/dL — ABNORMAL HIGH (ref 6–20)
CALCIUM: 9.1 mg/dL (ref 8.9–10.3)
CO2: 27 mmol/L (ref 22–32)
CREATININE: 1.84 mg/dL — AB (ref 0.44–1.00)
Chloride: 102 mmol/L (ref 101–111)
GFR calc Af Amer: 27 mL/min — ABNORMAL LOW (ref >60)
GFR, EST NON AFRICAN AMERICAN: 24 mL/min — AB (ref >60)
GLUCOSE: 80 mg/dL (ref 65–99)
Potassium: 3.7 mmol/L (ref 3.5–5.1)
Sodium: 140 mmol/L (ref 135–145)

## 2016-12-31 LAB — CBC
HCT: 40.8 % (ref 36.0–46.0)
Hemoglobin: 13.6 g/dL (ref 12.0–15.0)
MCH: 30.3 pg (ref 26.0–34.0)
MCHC: 33.3 g/dL (ref 30.0–36.0)
MCV: 90.9 fL (ref 78.0–100.0)
PLATELETS: 286 10*3/uL (ref 150–400)
RBC: 4.49 MIL/uL (ref 3.87–5.11)
RDW: 13.5 % (ref 11.5–15.5)
WBC: 9.8 10*3/uL (ref 4.0–10.5)

## 2016-12-31 NOTE — Telephone Encounter (Signed)
Her chest xray showed expected changes of COPD w/o pneumonia.  Her lab tests were unremarkable.  If she is not admitted to hospital, then schedule her for f/u visit with NP on Friday.

## 2016-12-31 NOTE — Telephone Encounter (Signed)
Spoke with pt's daughter, Kendal Hymen (pt gave verbal). Edie states pt is currently waiting in ED, as rooms are full. Pt has had labs and CXR. Kendal Hymen has requested that VS review CXR and give recommendations if possible. Pt's reports of increased increased. [facility] was labored and recommended that pt be evaluated at ED. Pt O2 on 2L was 80%, liter flow was increased to 3L and O2 increased to 92%.  VS please advise. Thanks.

## 2016-12-31 NOTE — Telephone Encounter (Signed)
New Message     Pt is not any better they sent her to the ER over at [facility], they are in lobby, O2 sat was 80, breathing is labored , they have had to bump oxygen up twice.

## 2016-12-31 NOTE — Telephone Encounter (Signed)
Pt daughter calling back to speak to nurse a/b mother, please advise they are still @ the hospital wanting to know what to do she can be reached @ [phone].Caren Griffins

## 2016-12-31 NOTE — Telephone Encounter (Signed)
Spoke with her daughter ans states her mom had a bad night, stated she had some labored breathing, on 2L at 80%, and she  bumped it up to 3 and got it up to 92%. They are now at [facility] and she states VS told her to call back here to the office if she was no better. She asked why VS did not do a chest xray and I explained VS would order one if she wasn't better with a prescription for prednisone. VS please advise. I told her to stay at the hospital for now. Please advise.

## 2016-12-31 NOTE — ED Triage Notes (Addendum)
Patient reports seeing Pulmonologist on yesterday for COPD. She was started on Doxycycline and instructed to contact office if no improvement. Per patient and family did not contact office just came here because her temp was 99.6 per staff at [facility].She was given 650 of Tylenol at 10am.  Patient denies pain or cough and congestion. Able to talk in sentences with some shortness of breath. She does wear 2L/[place] at all times and is currently wearing. Family is at bedside. Sats are 95% on 2L/[place]

## 2016-12-31 NOTE — ED Notes (Signed)
Patient's daughter advised that patient had to increase oxygen to 3L/[place] and would like to be switched to hospitals tank. Patient was placed on oxygen tank at 3L/[place]. Pt declined mask stating "it makes her feel as though she is suffocating."

## 2016-12-31 NOTE — Telephone Encounter (Signed)
Returned call to patient's daughter Kendal Hymen.She stated mother is currently in [facility] waiting to be seen.Stated sob is worse.Stated when she saw mother this morning her O2 sat 80% on 2L of O2.She increased O2 to 4L and O2 sat 92%.She saw Dr.Sood yesterday and was prescribed doxycyline,no cxr done.She is upset cxr was not done.She will call Dr.Sood's office.I will make Dr.Jordan aware.

## 2017-01-01 ENCOUNTER — Ambulatory Visit (INDEPENDENT_AMBULATORY_CARE_PROVIDER_SITE_OTHER): Payer: Medicare Other | Admitting: Pulmonary Disease

## 2017-01-01 ENCOUNTER — Telehealth: Payer: Self-pay | Admitting: Pulmonary Disease

## 2017-01-01 VITALS — BP 118/68 | HR 88 | Ht 63.0 in | Wt 111.0 lb

## 2017-01-01 DIAGNOSIS — J9621 Acute and chronic respiratory failure with hypoxia: Secondary | ICD-10-CM | POA: Diagnosis not present

## 2017-01-01 DIAGNOSIS — J449 Chronic obstructive pulmonary disease, unspecified: Secondary | ICD-10-CM

## 2017-01-01 DIAGNOSIS — J9620 Acute and chronic respiratory failure, unspecified whether with hypoxia or hypercapnia: Secondary | ICD-10-CM | POA: Insufficient documentation

## 2017-01-01 DIAGNOSIS — I779 Disorder of arteries and arterioles, unspecified: Secondary | ICD-10-CM | POA: Diagnosis not present

## 2017-01-01 DIAGNOSIS — R0600 Dyspnea, unspecified: Secondary | ICD-10-CM | POA: Diagnosis not present

## 2017-01-01 MED ORDER — PREDNISONE 20 MG PO TABS: ORAL_TABLET | ORAL | 0 refills | Status: DC

## 2017-01-01 NOTE — Progress Notes (Signed)
Subjective:    Patient ID: [name], female    DOB: [birth year], 83 y.o.   MRN: [id number]  [facility].:  Acute visit for Dyspnea with known Mild COPD & Chronic Hypoxic Respiratory Failure.  HPI She waited for 5 hours but didn't receive any treatment in the ED.   Dyspnea:  She reports her degree of dyspnea has progressively worsened over the last 24-48 hours. She does feel it is somewhat better. She reports dyspnea at rest as well as with exertion.   Mild COPD:  Prescribed Incruse. She denies any associated coughing or wheezing. Reports she is adherent to her Incruse. She is using her rescue inhaler a couple of times daily with some transient relief in her dyspnea. She reports she did start taking Prednisone today and also has been taking Doxycycline.   Chronic Hypoxic Respiratory Failure: Prescribed oxygen at 2 L/m. She is currently using oxygen at 3 L/m with her increased dyspnea. She was reportedly saturating 80% on 2 L/m at home yesterday.   Review of Systems She denies any chest pain, tightness, pressure, edema or weight gain of more than 1 pound. No fever or sweats. She did have a "shaking chill" recently. Denies any palpitations. No sinus congestion, pressure, or drainage. No abdominal pain, nausea, or emesis.   Allergies  Allergen Reactions  . Codeine Sulfate Nausea And Vomiting and Other (See Comments)    headache    Current Outpatient Prescriptions on File Prior to Visit  Medication Sig Dispense Refill  . acetaminophen (TYLENOL) 500 MG tablet Take 500-1,000 mg by mouth every 4 (four) hours as needed for mild pain.     Marland Kitchen albuterol (PROAIR HFA) 108 (90 Base) MCG/ACT inhaler Inhale 2 puffs into the lungs every 6 (six) hours as needed for wheezing or shortness of breath. 1 Inhaler 3  . atorvastatin (LIPITOR) 10 MG tablet Take 5 mg by mouth daily.     . benzonatate (TESSALON) 100 MG capsule Take 1 capsule (100 mg total) by mouth 3 (three) times daily as needed for cough. 20 capsule 0   . bimatoprost (LUMIGAN) 0.01 % SOLN Place 1 drop into both eyes at bedtime.    . cholecalciferol (VITAMIN D) 1000 UNITS tablet Take 1,000 Units by mouth 2 (two) times daily.     Marland Kitchen diltiazem (CARDIZEM) 120 MG tablet Take 240 mg by mouth daily.     Marland Kitchen doxycycline (VIBRA-TABS) 100 MG tablet Take 1 tablet (100 mg total) by mouth 2 (two) times daily. 14 tablet 0  . ELIQUIS 2.5 MG TABS tablet Take 2.5 mg by mouth 2 (two) times daily.    . feeding supplement, ENSURE ENLIVE, (ENSURE ENLIVE) LIQD Take 237 mLs by mouth 2 (two) times daily between meals. 60 Bottle 0  . Fluticasone-Salmeterol (ADVAIR) 250-50 MCG/DOSE AEPB Inhale 1 puff into the lungs 2 (two) times daily.    . furosemide (LASIX) 20 MG tablet Take 1 tablet (20 mg total) by mouth daily. 30 tablet 0  . OXYGEN Inhale 2 L into the lungs daily.    . potassium chloride (K-DUR,KLOR-CON) 10 MEQ tablet Take 10 mEq by mouth 2 (two) times daily.    Marland Kitchen tetrahydrozoline 0.05 % ophthalmic solution Place 1 drop into both eyes 4 (four) times daily as needed (For dry eyes.).    Marland Kitchen traMADol-acetaminophen (ULTRACET) 37.5-325 MG per tablet Take 1-2 tablets by mouth 2 (two) times daily as needed for moderate pain.     Marland Kitchen umeclidinium bromide (INCRUSE ELLIPTA) 62.5 MCG/INH AEPB  Inhale 1 puff into the lungs daily.    . vitamin E 1000 UNIT capsule Take 1,000 Units by mouth 2 (two) times daily.      No current facility-administered medications on file prior to visit.     Past Medical History:  Diagnosis Date  . Anticoagulated    Eliquis  . Atrial flutter (HCC)    NO CARDIOLOGIST FOLLOWED BY DR Tori Milks  . Bloody stools    LAST 4 DAYS  . CHF (congestive heart failure) (HCC)   . Chronic back pain   . CKD (chronic kidney disease), stage III    Creatinine 1.35 -1.55  . COPD (chronic obstructive pulmonary disease) (HCC)   . Diverticulosis   . Dyspnea   . Dysrhythmia   . Edema   . Gait disorder   . Glaucoma    Severe  . Glaucoma    both eyes  . High  cholesterol   . Multinodular goiter   . PAOD (peripheral arterial occlusive disease) (HCC)    Stein in right groin, decrease pulses  . PMR (polymyalgia rheumatica) (HCC)   . Pneumonia, organism unspecified(486) 04/18/2016  . Protein-calorie malnutrition (HCC)   . Rash of back   . Weakness     Past Surgical History:  Procedure Laterality Date  . COLONOSCOPY N/A 07/15/2013   Procedure: COLONOSCOPY;  Surgeon: Louis Meckel, MD;  Location: [facility] ENDOSCOPY;  Service: Endoscopy;  Laterality: N/A;  . ESOPHAGOGASTRODUODENOSCOPY N/A 07/08/2013   Procedure: ESOPHAGOGASTRODUODENOSCOPY (EGD);  Surgeon: Louis Meckel, MD;  Location: [facility];  Service: Endoscopy;  Laterality: N/A;  Per Connye Burkitt, will try to have CRNA available, but will not guaranteee. Ok to schedule per Clydie Braun 07/06/13  . GALLBLADDER SURGERY    . IR GENERIC HISTORICAL  05/18/2014   IR RADIOLOGIST EVAL & MGMT 05/18/2014 Berdine Dance, MD [facility] INTERV RAD  . LAMINECTOMY    . leg stent Right   . SHOULDER SURGERY     frozen shoulder/ Bil shoulders    Family History  Problem Relation Age of Onset  . Throat cancer Mother   . Diabetes Maternal Uncle   . Throat cancer Maternal Grandfather   . Cirrhosis Maternal Aunt     Social History   Social History  . Marital status: Married    Spouse name: N/A  . Number of children: 4  . Years of education: N/A   Occupational History  . Retired    Social History Main Topics  . Smoking status: Former Smoker    Types: Cigarettes    Quit date: 07/12/1993  . Smokeless tobacco: Never Used  . Alcohol use No  . Drug use: No  . Sexual activity: Not on file   Other Topics Concern  . Not on file   Social History Narrative   Married -Dr. Aura Fey   Former smoker - stopped 1995   Alcohol none   POA, Living Will      Objective:   Physical Exam BP 118/68 (BP Location: Right Arm, Cuff Size: Normal)   Pulse 88   Ht  (1.6 m)   Wt 111 lb (50.3 kg)   SpO2 91%   BMI  19.66 kg/m  General:  Awake. Alert. No acute distress. Elderly female. Accompanied by daughter and husband today. Integument:  Warm & dry. No rash on exposed skin. No bruising on exposed skin. Extremities:  No cyanosis.  HEENT:  Moist mucus membranes. No oral ulcers. No scleral injection or icterus.  Cardiovascular:  Regular rate. No edema. Regular rhythm.  Pulmonary:  Good aeration & clear to auscultation bilaterally. Symmetric chest wall expansion. No accessory muscle use on supplemental oxygen. Abdomen: Soft. Normal bowel sounds. Nondistended.   Musculoskeletal:  Normal bulk and tone. No joint deformity or effusion appreciated.  PFT 04/17/16: FVC 1.93 L (86%) FEV1 1.15 L (70%) FEV1/FVC 0.60 FEF 25-75 0.47 L (45%)  CBC Latest Ref Rng & Units 12/31/2016 09/08/2016 09/07/2016  WBC 4.0 - 10.5 K/uL 9.8 15.0(H) 17.5(H)  Hemoglobin 12.0 - 15.0 g/dL 40.9 81.1 91.4  Hematocrit 36.0 - 46.0 % 40.8 36.7 38.8  Platelets 150 - 400 K/uL 286 307 325   BMP Latest Ref Rng & Units 12/31/2016 09/08/2016 09/07/2016  Glucose 65 - 99 mg/dL 80 782(N) 562(Z)  BUN 6 - 20 mg/dL 30(Q) 65(H) 84(O)  Creatinine 0.44 - 1.00 mg/dL 9.62(X) 5.28(U) 1.32(G)  BUN/Creat Ratio 12 - 28 - - -  Sodium 135 - 145 mmol/L 140 141 141  Potassium 3.5 - 5.1 mmol/L 3.7 5.1 3.8  Chloride 101 - 111 mmol/L 102 104 103  CO2 22 - 32 mmol/L Calcium 8.9 - 10.3 mg/dL 9.1 9.1 9.0   CXR PA/LAT 12/31/16:  Personally reviewed by me.  No focal opacity or effusion. Heart normal in size.      Assessment & Plan:  83 y.o. female with chronic hypoxic respiratory failure and mild COPD based on spirometry from January. She presents today with acute on chronic hypoxic respiratory failure and symptoms that could be consistent with a mild COPD exacerbation. Reviewing her chest x-ray and labs from the emergency department indicated no evidence of pneumonia. She has no symptoms of with suggest an infectious process other than chills/rigors that she  experienced previously. Clinically she does feel somewhat better today and I cautioned the patient as well as her family that she should seek immediate medical attention if she has any clinical worsening or worsening in her oxygenation.  1. Acute on chronic hypoxic respiratory failure: Continuing on oxygen at 3 L/m. Patient will monitor with pulse oximeter at home. 2. Mild COPD with acute exacerbation: Switching to prednisone 40 mg daily for 4 days. Continuing Incruse. Recommended using her albuterol rescue inhaler 3-4 times daily regularly while she is ill. Continuing doxycycline as prescribed. 3. Follow-up: Patient to return to clinic next week for follow-up evaluation.  Donna Christen Jamison Neighbor, M.D. [facility] Pulmonary & Critical Care Pager:  [phone] After 3pm or if no response, call [phone] 4:06 PM 01/01/17

## 2017-01-01 NOTE — Telephone Encounter (Signed)
Noted  

## 2017-01-01 NOTE — Telephone Encounter (Signed)
Called spoke with [facility] and discussed VS' recommendations.  Edie reported that pt was not able to stay at the ED last night to see if she needed to be admitted, the wait was simply too long to receive full care.  Kendal Hymen is in agreement with recommendations for pt to come in for appt.  She would like for me to call patient/spouse to schedule this appt.  Edie would like a couple of things discussed with patient tomorrow at the appt:  1. When Edie got to the facility, pt was in distress and sats were 80% on 2lpm (as documented below).  However, during all of this neither the patient or spouse thought to check pt's oxygen saturation.  Edie would like this reiterated to patient to monitor this and adjust O2 as needed to maintain sats at/above 90%. 2. Kendal Hymen would like it discussed with patient that her COPD will progress and she/spouse will need to be vigilant about monitoring her symptoms.  Called spoke with patient's spouse Dr. Marca Ancona.  Dr Marca Ancona refuses for pt to see a NP as recommended by VS and wants pt to only see an MD.  There are limited openings tomorrow >> appt scheduled with Dr Sherene Sires @ 1000.  Dr Marca Ancona is aware to contact the office if pt's symptoms worsen prior to appt.  Will sign and forward to VS as FYI.

## 2017-01-01 NOTE — Patient Instructions (Signed)
   Keep taking your Doxycycline as prescribed.  Use the Prednisone prescription we are sending in today instead of the one you have.  Remember to avoid any dairy products with your Doxycycline & take with a full glass of water remaining upright for 1 hour afterward.  Continue using your Incruse.   Use your Albuterol inhaler every 4-6 hours regularly while you are ill.  Seek immediate medical attention if you feel you are getting worse or your oxygen saturation continues to decline.  We will see you back next week.

## 2017-01-01 NOTE — Telephone Encounter (Signed)
Called spoke with [facility] @ 1057, she is there with patient now.  Respirations = 40, sats on 3lpm = 92% but Edie feels like pt's sat is that good because she's compensating with the respirations.  Pt stated she had a rough night last night with her breathing and SOB.  Edie unsure about taking pt to the ED > stated VS mentioned starting prednisone and would like to know if VS would like to go ahead and start this.  Pt has  tabs at home.  Went ahead and scheduled appt with JN at 1530 today.   Went to speak with VS >> okay to start prednisone  3tabs x2 days, 2 for 2 days, 1 for 2 days and stop.  Okay to keep appt with JN this afternoon but if Edie feels like pt needs to go to the ER then strongly urge this.  Called spoke with [facility] again and discussed VS's recommendations.  Edie stated pt absolutely refused to go back to the ER.  Kendal Hymen will go ahead and give pt the  prednisone now and keep appt with JN this afternoon.  Kendal Hymen is aware that although pt refuses now, if symptoms worsen and pt's status declines then EMS needs to be called.  Kendal Hymen will speak with JN this afternoon about refilling the prednisone.  Nothing further needed at this time; will sign off.

## 2017-01-02 ENCOUNTER — Ambulatory Visit: Payer: Medicare Other | Admitting: Internal Medicine

## 2017-01-07 ENCOUNTER — Ambulatory Visit: Payer: Medicare Other | Admitting: Pulmonary Disease

## 2017-01-07 ENCOUNTER — Ambulatory Visit (INDEPENDENT_AMBULATORY_CARE_PROVIDER_SITE_OTHER): Payer: Medicare Other | Admitting: Acute Care

## 2017-01-07 ENCOUNTER — Encounter: Payer: Self-pay | Admitting: Acute Care

## 2017-01-07 ENCOUNTER — Telehealth: Payer: Self-pay | Admitting: Acute Care

## 2017-01-07 VITALS — BP 118/70 | HR 69 | Temp 96.2°F

## 2017-01-07 DIAGNOSIS — J9621 Acute and chronic respiratory failure with hypoxia: Secondary | ICD-10-CM

## 2017-01-07 DIAGNOSIS — J449 Chronic obstructive pulmonary disease, unspecified: Secondary | ICD-10-CM | POA: Diagnosis not present

## 2017-01-07 DIAGNOSIS — J441 Chronic obstructive pulmonary disease with (acute) exacerbation: Secondary | ICD-10-CM | POA: Diagnosis not present

## 2017-01-07 DIAGNOSIS — I779 Disorder of arteries and arterioles, unspecified: Secondary | ICD-10-CM | POA: Diagnosis not present

## 2017-01-07 MED ORDER — ALBUTEROL SULFATE (2.5 MG/3ML) 0.083% IN NEBU: 2.5000 mg | INHALATION_SOLUTION | Freq: Four times a day (QID) | RESPIRATORY_TRACT | 5 refills | Status: DC | PRN

## 2017-01-07 MED ORDER — AZITHROMYCIN 250 MG PO TABS: ORAL_TABLET | ORAL | 0 refills | Status: DC

## 2017-01-07 MED ORDER — LEVALBUTEROL HCL 0.63 MG/3ML IN NEBU
0.6300 mg | INHALATION_SOLUTION | Freq: Once | RESPIRATORY_TRACT | Status: AC
  Administered 2017-01-07: 0.63 mg via RESPIRATORY_TRACT

## 2017-01-07 MED ORDER — PREDNISONE 10 MG PO TABS: ORAL_TABLET | ORAL | 0 refills | Status: DC

## 2017-01-07 NOTE — Progress Notes (Signed)
History of Present Illness [name] is a 83 y.o. female former smoker (quit in 1995)  with dyspnea, mild COPD, and chronic hypoxic respiratory failure on oxygen therapy. She is followed by Dr. Jamison Neighbor.  Maintenance medication: Incruse with Rescue inhaler for break through dyspnea/ wheezing  01/07/2017 1 Week Follow Up: Pt presents for follow up. She was seen on 9/27 by Dr. Jamison Neighbor  for an acute visit with symptoms that  Included worsening dyspnea over the previous week requiring her to use her rescue inhaler approximately twice daily. She was already on Doxycycline at the time of the appointment.Dr. Jamison Neighbor added a prednisone taper. She denied any change in sputum or fevers.CXR was clear. She was requiring 3L instead of her usual 2 L, and endorsed drops of sats into the 80% range. She refused to go to the ED due to the length of time she was required to wait for care despite being advised to do so. Plan at that time was :  Keep taking your Doxycycline as prescribed.  Prednisone taper  Remember to avoid any dairy products with your Doxycycline & take with a full glass of water remaining upright for 1 hour afterward.  Continue using your Incruse.   Use your Albuterol inhaler every 4-6 hours regularly while you are ill.  Seek immediate medical attention if you feel you are getting worse or your oxygen saturation continues to decline.  We will see you back next week.    Pt. Presents for follow up today ( 01/07/2017). She states she was doing well until until she completed the doxycycline and the prednisone. The she started feeling bad again. She denies cough. She states she had a low grade fever early today per the RN at [facility]. Per her daughter, she does not usually mount a significant fever response.She is afebrile this afternoon.Her husband is a retired Development worker, community and insists  on  antibiotic to cover secondary infection even though the patient does not have any clinical signs of  infection with the exception of a low grade fever this morning which has resolved. We discussed the risk of c-difficile in repeated antibiotic therapy. She is using her rescue inhaler 3-4 times daily.She does not have any cough or increase in sputum production. She is saturating 96% on 3 L. I have reminded her the saturation  Goal is 88-92%, and told her to decrease her oxygen to 2 L while at rest. We discussed that her drive to breath is oxygen specific, and that maintaining saturations that are too high can be dangerous. She denies chest pain, orthopnea or hemoptysis. She denies any nasal drainage or congestion.  Test Results:  9/28 2017 CXR>> No acute cardiopulmonary disease. Stable changes of COPD/emphysema.  CBC Latest Ref Rng & Units 12/31/2016 09/08/2016 09/07/2016  WBC 4.0 - 10.5 K/uL 9.8 15.0(H) 17.5(H)  Hemoglobin 12.0 - 15.0 g/dL 04.5 40.9 81.1  Hematocrit 36.0 - 46.0 % 40.8 36.7 38.8  Platelets 150 - 400 K/uL 286 307 325    BMP Latest Ref Rng & Units 12/31/2016 09/08/2016 09/07/2016  Glucose 65 - 99 mg/dL 80 914(N) 829(F)  BUN 6 - 20 mg/dL 62(Z) 30(Q) 65(H)  Creatinine 0.44 - 1.00 mg/dL 8.46(N) 6.29(B) 2.84(X)  BUN/Creat Ratio 12 - 28 - - -  Sodium 135 - 145 mmol/L 140 141 141  Potassium 3.5 - 5.1 mmol/L 3.7 5.1 3.8  Chloride 101 - 111 mmol/L 102 104 103  CO2 22 - 32 mmol/L 27 27 27  Calcium 8.9 - 10.3 mg/dL 9.1 9.1 9.0    BNP    Component Value Date/Time   BNP 416.5 (H) 09/05/2016 1919    PFT    Component Value Date/Time   FEV1PRE 1.15 04/17/2016 0000   FVCPRE 1.93 04/17/2016 0000   PREFEV1FVCRT 60 04/17/2016 0000    Dg Chest 2 View  Result Date: 12/31/2016 CLINICAL DATA:  83 year old female with shortness breath for 5 months. EXAM: CHEST  2 VIEW COMPARISON:  09/17/2016 FINDINGS: The cardiomediastinal silhouette is stable. Atherosclerotic calcifications are noted at the aortic arch. There is hyperinflation of the lungs without focal opacities pleural effusion or  pneumothorax. Coarse interstitial markings appear similar to prior exam. No acute osseous abnormalities. IMPRESSION: 1. No acute cardiopulmonary disease. 2. Stable changes of COPD/emphysema. Electronically Signed   By: Sande Brothers M.D.   On: 12/31/2016 15:22     Past medical hx Past Medical History:  Diagnosis Date  . Anticoagulated    Eliquis  . Atrial flutter (HCC)    NO CARDIOLOGIST FOLLOWED BY DR Tori Milks  . Bloody stools    LAST 4 DAYS  . CHF (congestive heart failure) (HCC)   . Chronic back pain   . CKD (chronic kidney disease), stage III (HCC)    Creatinine 1.35 -1.55  . COPD (chronic obstructive pulmonary disease) (HCC)   . Diverticulosis   . Dyspnea   . Dysrhythmia   . Edema   . Gait disorder   . Glaucoma    Severe  . Glaucoma    both eyes  . High cholesterol   . Multinodular goiter   . PAOD (peripheral arterial occlusive disease) (HCC)    Stein in right groin, decrease pulses  . PMR (polymyalgia rheumatica) (HCC)   . Pneumonia, organism unspecified(486) 04/18/2016  . Protein-calorie malnutrition (HCC)   . Rash of back   . Weakness      Social History  Substance Use Topics  . Smoking status: Former Smoker    Types: Cigarettes    Quit date: 07/12/1993  . Smokeless tobacco: Never Used  . Alcohol use No    Ms.Power reports that she quit smoking about 23 years ago. Her smoking use included Cigarettes. She has never used smokeless tobacco. She reports that she does not drink alcohol or use drugs.  Tobacco Cessation: Former smoker quit 1995  Past surgical hx, Family hx, Social hx all reviewed.  Current Outpatient Prescriptions on File Prior to Visit  Medication Sig  . acetaminophen (TYLENOL) 500 MG tablet Take 500-1,000 mg by mouth every 4 (four) hours as needed for mild pain.   Marland Kitchen albuterol (PROAIR HFA) 108 (90 Base) MCG/ACT inhaler Inhale 2 puffs into the lungs every 6 (six) hours as needed for wheezing or shortness of breath.  Marland Kitchen atorvastatin  (LIPITOR) 10 MG tablet Take 5 mg by mouth daily.   . benzonatate (TESSALON) 100 MG capsule Take 1 capsule (100 mg total) by mouth 3 (three) times daily as needed for cough.  . bimatoprost (LUMIGAN) 0.01 % SOLN Place 1 drop into both eyes at bedtime.  . cholecalciferol (VITAMIN D) 1000 UNITS tablet Take 1,000 Units by mouth 2 (two) times daily.   Marland Kitchen diltiazem (CARDIZEM) 120 MG tablet Take 240 mg by mouth daily.   Marland Kitchen ELIQUIS 2.5 MG TABS tablet Take 2.5 mg by mouth 2 (two) times daily.  . feeding supplement, ENSURE ENLIVE, (ENSURE ENLIVE) LIQD Take 237 mLs by mouth 2 (two) times daily between meals.  . Fluticasone-Salmeterol (ADVAIR)  250-50 MCG/DOSE AEPB Inhale 1 puff into the lungs 2 (two) times daily.  . furosemide (LASIX) 20 MG tablet Take 1 tablet (20 mg total) by mouth daily.  . OXYGEN Inhale 2 L into the lungs daily.  . potassium chloride (K-DUR,KLOR-CON) 10 MEQ tablet Take 10 mEq by mouth 2 (two) times daily.  Marland Kitchen tetrahydrozoline 0.05 % ophthalmic solution Place 1 drop into both eyes 4 (four) times daily as needed (For dry eyes.).  Marland Kitchen traMADol-acetaminophen (ULTRACET) 37.5-325 MG per tablet Take 1-2 tablets by mouth 2 (two) times daily as needed for moderate pain.   Marland Kitchen umeclidinium bromide (INCRUSE ELLIPTA) 62.5 MCG/INH AEPB Inhale 1 puff into the lungs daily.  . vitamin E 1000 UNIT capsule Take 1,000 Units by mouth 2 (two) times daily.    No current facility-administered medications on file prior to visit.      Allergies  Allergen Reactions  . Codeine Sulfate Nausea And Vomiting and Other (See Comments)    headache    Review Of Systems:  Constitutional:   No  weight loss, night sweats,  +Fevers, chills, +fatigue, or  lassitude.  HEENT:   No headaches,  Difficulty swallowing,  Tooth/dental problems, or  Sore throat,                No sneezing, itching, ear ache, nasal congestion, post nasal drip,   CV:  No chest pain,  Orthopnea, PND, + swelling in lower extremities, anasarca,  dizziness, palpitations, syncope.   GI  No heartburn, indigestion, abdominal pain, nausea, vomiting, diarrhea, change in bowel habits, loss of appetite, bloody stools.   Resp: + shortness of breath with exertion less at rest.  No excess mucus, no productive cough,  No non-productive cough,  No coughing up of blood.  No change in color of mucus.  No wheezing.  No chest wall deformity  Skin: no rash or lesions.  GU: no dysuria, change in color of urine, no urgency or frequency.  No flank pain, no hematuria   MS:  No joint pain or swelling.  + decreased range of motion.  No back pain.  Psych:  No change in mood or affect. No depression or + anxiety.  No memory loss.   Vital Signs BP 118/70 (BP Location: Left Arm, Cuff Size: Normal)   Pulse 69   Temp (!) 96.2 F (35.7 C) (Oral)   SpO2 96%    Physical Exam:  General- No distress,  A&Ox3, pleasant elderly female in a wheelchair, very deconditioned. ENT: No sinus tenderness, TM clear, pale nasal mucosa, no oral exudate,no post nasal drip, no LAN Cardiac: S1, S2, regular rate and rhythm, no murmur Chest: No wheeze/ rales/ dullness; no accessory muscle use, no nasal flaring, no sternal retractions Abd.: Soft Non-tender, non-distended, BS+ Ext: No clubbing cyanosis, trace bilateral lower extremity edema Neuro:  Deconditioned at baseline, in a wheelchair today Skin: No rashes, warm and dry Psych: normal mood and behavior   Assessment/Plan  COPD exacerbation (HCC) Slow to resolve exacerbation Plan: We will repeat prednisone taper. Prednisone taper; 10 mg tablets: 4 tabs x 2 days, 3 tabs x 2 days, 2 tabs x 2 days 1 tab x 2 days then stop. We will give you a prescription for a z pack to fill in the event you become clinically worse. Remember saturation goals are 88-92%. Monitor your saturations with an oxygen saturation monitor several times a day. We will order a nebulizer machine for your rescue medication. We will order nebulized  albuterol.  You will use this as needed every 4-6 hours for breakthrough shortness of breath of wheezing. Use this instead of your rescue inhaler. Wear your oxygen ar 2L [place] at rest and 3L [place] with exertion. Continue your Incruse every day as maintenance Rinse mouth after use. Follow up with Dr. Jamison Neighbor 01/22/2017 Please contact office for sooner follow up if symptoms do not improve or worsen or seek emergency care    Acute on chronic respiratory failure (HCC) Remember saturation goals are 88-92%. Monitor your saturations with an oxygen saturation monitor several times a day. Wear your oxygen at 2 L [place] with rest and 3L [place] with exertion. Seek emergency care with worsening that does not respond to treatment. Please contact office for sooner follow up if symptoms do not improve or worsen or seek emergency care     Because she completed the Doxy and prednisone at about the same time I am Unsure if the antibiotic or prednisone was  most helpful  regarding symptom management. She may be at a point where she needs a chronic low dose prednisone.  I have also switched rescue medication to nebulizer treatments.  Bevelyn Ngo, NP 01/07/2017  9:31 PM

## 2017-01-07 NOTE — Assessment & Plan Note (Signed)
Remember saturation goals are 88-92%. Monitor your saturations with an oxygen saturation monitor several times a day. Wear your oxygen at 2 L [place] with rest and 3L  with exertion. Seek emergency care with worsening that does not respond to treatment. Please contact office for sooner follow up if symptoms do not improve or worsen or seek emergency care

## 2017-01-07 NOTE — Patient Instructions (Addendum)
It is nice to see you today. We will repeat prednisone taper. Prednisone taper; 10 mg tablets: 4 tabs x 2 days, 3 tabs x 2 days, 2 tabs x 2 days 1 tab x 2 days then stop. We will give you a prescription for a z pack to fill in the event you become clinically worse. Remember saturation goals are 88-92%. Monitor your saturations with an oxygen saturation monitor several times a day. Continue your Incruse every day as maintenance. We will order a nebulizer machine for your rescue medication. We will order nebulized albuterol. You will use this as needed every 4-6 hours for breakthrough shortness of breath of wheezing. Use this instead of your rescue inhaler. Xopenex treatment now. Follow up in 1 week with Dr. Jamison Neighbor October 18 at 1:45 If you have worsening that does not respond to treatment go to the Emergency Room. Please contact office for sooner follow up if symptoms do not improve or worsen or seek emergency care

## 2017-01-07 NOTE — Assessment & Plan Note (Addendum)
Slow to resolve exacerbation Plan: We will repeat prednisone taper. Prednisone taper; 10 mg tablets: 4 tabs x 2 days, 3 tabs x 2 days, 2 tabs x 2 days 1 tab x 2 days then stop. We will give you a prescription for a z pack to fill in the event you become clinically worse. Remember saturation goals are 88-92%. Monitor your saturations with an oxygen saturation monitor several times a day. We will order a nebulizer machine for your rescue medication. We will order nebulized albuterol. You will use this as needed every 4-6 hours for breakthrough shortness of breath of wheezing. Use this instead of your rescue inhaler. Wear your oxygen ar 2L [place] at rest and 3L [place] with exertion. Continue your Incruse every day as maintenance Rinse mouth after use. Follow up with Dr. Jamison Neighbor 01/22/2017 Please contact office for sooner follow up if symptoms do not improve or worsen or seek emergency care

## 2017-01-08 ENCOUNTER — Telehealth: Payer: Self-pay | Admitting: Acute Care

## 2017-01-08 DIAGNOSIS — J9621 Acute and chronic respiratory failure with hypoxia: Secondary | ICD-10-CM

## 2017-01-08 DIAGNOSIS — J449 Chronic obstructive pulmonary disease, unspecified: Secondary | ICD-10-CM

## 2017-01-08 NOTE — Telephone Encounter (Signed)
I signed order in epic.

## 2017-01-08 NOTE — Telephone Encounter (Signed)
PCC's please advise if order can be faxed to [facility] by 5:00 today.   Thanks!

## 2017-01-08 NOTE — Telephone Encounter (Signed)
Called [facility] & spoke to [place].  They do have order but it has not been signed by Maralyn Sago.  Order will need to be signed before they can process it.  I have called the pt & made her aware.

## 2017-01-08 NOTE — Telephone Encounter (Signed)
Derrell Lolling, patient's daughter calling about nebulizer for patient.  She spoke with [facility] and they told her that they will not accept signature from NP, must be MD.  States they need this before 5:00pm for the patient to get this tomorrow.  She states her mother needs this machine for the weekend.  Edie CB is [phone].

## 2017-01-08 NOTE — Telephone Encounter (Signed)
I sent them a community message as high priority but I can fax the order also

## 2017-01-08 NOTE — Telephone Encounter (Signed)
Spoke with pt's daughter, states that rx for nebulizer must be signed by a MD and not a NP.  Requesting that we order this and sign it before 5:00 to be delivered tomorrow.    Order placed under VS.  VS please advise after signing nebulizer rx.  Thanks.

## 2017-01-08 NOTE — Progress Notes (Signed)
Note reviewed.  Donna Christen Jamison Neighbor, M.D. [facility] Pulmonary [facility] Critical [facility] Pager:  [phone] After 7pm or if no response, call [phone] 8:00 AM 01/08/17

## 2017-01-08 NOTE — Progress Notes (Signed)
I have reviewed and agree with assessment/plan.  Eldin Bonsell, MD [place] [facility] 01/08/2017, 9:32 AM Pager:  [phone]  

## 2017-01-08 NOTE — Telephone Encounter (Signed)
SG can you please sign the order for [facility]>  Thanks

## 2017-01-09 NOTE — Telephone Encounter (Signed)
Thank you :)

## 2017-01-09 NOTE — Telephone Encounter (Signed)
I have a message from [place] at [facility] that she has this order

## 2017-01-12 ENCOUNTER — Inpatient Hospital Stay (HOSPITAL_COMMUNITY)
Admission: EM | Admit: 2017-01-12 | Discharge: 2017-01-14 | DRG: 190 | Disposition: A | Discharge status: Home Health | Payer: Medicare Other | Attending: Internal Medicine | Admitting: Internal Medicine

## 2017-01-12 ENCOUNTER — Emergency Department (HOSPITAL_COMMUNITY): Payer: Medicare Other

## 2017-01-12 ENCOUNTER — Encounter (HOSPITAL_COMMUNITY): Payer: Self-pay | Admitting: *Deleted

## 2017-01-12 DIAGNOSIS — I130 Hypertensive heart and chronic kidney disease with heart failure and stage 1 through stage 4 chronic kidney disease, or unspecified chronic kidney disease: Secondary | ICD-10-CM | POA: Diagnosis present

## 2017-01-12 DIAGNOSIS — N184 Chronic kidney disease, stage 4 (severe): Secondary | ICD-10-CM | POA: Diagnosis present

## 2017-01-12 DIAGNOSIS — Z9981 Dependence on supplemental oxygen: Secondary | ICD-10-CM | POA: Diagnosis not present

## 2017-01-12 DIAGNOSIS — I5033 Acute on chronic diastolic (congestive) heart failure: Secondary | ICD-10-CM | POA: Diagnosis present

## 2017-01-12 DIAGNOSIS — Z9119 Patient's noncompliance with other medical treatment and regimen: Secondary | ICD-10-CM | POA: Diagnosis not present

## 2017-01-12 DIAGNOSIS — E875 Hyperkalemia: Secondary | ICD-10-CM | POA: Diagnosis not present

## 2017-01-12 DIAGNOSIS — E2740 Unspecified adrenocortical insufficiency: Secondary | ICD-10-CM | POA: Diagnosis present

## 2017-01-12 DIAGNOSIS — Z7901 Long term (current) use of anticoagulants: Secondary | ICD-10-CM

## 2017-01-12 DIAGNOSIS — I480 Paroxysmal atrial fibrillation: Secondary | ICD-10-CM | POA: Diagnosis present

## 2017-01-12 DIAGNOSIS — I4892 Unspecified atrial flutter: Secondary | ICD-10-CM | POA: Diagnosis present

## 2017-01-12 DIAGNOSIS — Z8701 Personal history of pneumonia (recurrent): Secondary | ICD-10-CM | POA: Diagnosis not present

## 2017-01-12 DIAGNOSIS — M549 Dorsalgia, unspecified: Secondary | ICD-10-CM | POA: Diagnosis present

## 2017-01-12 DIAGNOSIS — G8929 Other chronic pain: Secondary | ICD-10-CM | POA: Diagnosis present

## 2017-01-12 DIAGNOSIS — I482 Chronic atrial fibrillation: Secondary | ICD-10-CM | POA: Diagnosis present

## 2017-01-12 DIAGNOSIS — I5032 Chronic diastolic (congestive) heart failure: Secondary | ICD-10-CM

## 2017-01-12 DIAGNOSIS — Z885 Allergy status to narcotic agent status: Secondary | ICD-10-CM

## 2017-01-12 DIAGNOSIS — R0609 Other forms of dyspnea: Secondary | ICD-10-CM

## 2017-01-12 DIAGNOSIS — R627 Adult failure to thrive: Secondary | ICD-10-CM | POA: Diagnosis not present

## 2017-01-12 DIAGNOSIS — E876 Hypokalemia: Secondary | ICD-10-CM

## 2017-01-12 DIAGNOSIS — J9620 Acute and chronic respiratory failure, unspecified whether with hypoxia or hypercapnia: Secondary | ICD-10-CM | POA: Diagnosis present

## 2017-01-12 DIAGNOSIS — I081 Rheumatic disorders of both mitral and tricuspid valves: Secondary | ICD-10-CM | POA: Diagnosis present

## 2017-01-12 DIAGNOSIS — Z66 Do not resuscitate: Secondary | ICD-10-CM | POA: Diagnosis present

## 2017-01-12 DIAGNOSIS — E042 Nontoxic multinodular goiter: Secondary | ICD-10-CM | POA: Diagnosis present

## 2017-01-12 DIAGNOSIS — R0600 Dyspnea, unspecified: Secondary | ICD-10-CM | POA: Diagnosis present

## 2017-01-12 DIAGNOSIS — Z833 Family history of diabetes mellitus: Secondary | ICD-10-CM

## 2017-01-12 DIAGNOSIS — J441 Chronic obstructive pulmonary disease with (acute) exacerbation: Secondary | ICD-10-CM | POA: Diagnosis present

## 2017-01-12 DIAGNOSIS — H409 Unspecified glaucoma: Secondary | ICD-10-CM | POA: Diagnosis present

## 2017-01-12 DIAGNOSIS — I739 Peripheral vascular disease, unspecified: Secondary | ICD-10-CM | POA: Diagnosis present

## 2017-01-12 DIAGNOSIS — R55 Syncope and collapse: Secondary | ICD-10-CM

## 2017-01-12 DIAGNOSIS — R42 Dizziness and giddiness: Secondary | ICD-10-CM

## 2017-01-12 DIAGNOSIS — Z79899 Other long term (current) drug therapy: Secondary | ICD-10-CM

## 2017-01-12 DIAGNOSIS — I10 Essential (primary) hypertension: Secondary | ICD-10-CM

## 2017-01-12 DIAGNOSIS — R269 Unspecified abnormalities of gait and mobility: Secondary | ICD-10-CM | POA: Diagnosis present

## 2017-01-12 DIAGNOSIS — Z87891 Personal history of nicotine dependence: Secondary | ICD-10-CM

## 2017-01-12 DIAGNOSIS — J9621 Acute and chronic respiratory failure with hypoxia: Secondary | ICD-10-CM | POA: Diagnosis present

## 2017-01-12 DIAGNOSIS — E785 Hyperlipidemia, unspecified: Secondary | ICD-10-CM | POA: Diagnosis present

## 2017-01-12 DIAGNOSIS — M353 Polymyalgia rheumatica: Secondary | ICD-10-CM | POA: Diagnosis present

## 2017-01-12 LAB — BASIC METABOLIC PANEL
Anion gap: 16 — ABNORMAL HIGH (ref 5–15)
BUN: 30 mg/dL — ABNORMAL HIGH (ref 6–20)
CHLORIDE: 98 mmol/L — AB (ref 101–111)
CO2: 23 mmol/L (ref 22–32)
Calcium: 9 mg/dL (ref 8.9–10.3)
Creatinine, Ser: 1.9 mg/dL — ABNORMAL HIGH (ref 0.44–1.00)
GFR calc Af Amer: 26 mL/min — ABNORMAL LOW (ref >60)
GFR calc non Af Amer: 23 mL/min — ABNORMAL LOW (ref >60)
Glucose, Bld: 92 mg/dL (ref 65–99)
POTASSIUM: 3.4 mmol/L — AB (ref 3.5–5.1)
Sodium: 137 mmol/L (ref 135–145)

## 2017-01-12 LAB — CBC WITH DIFFERENTIAL/PLATELET
Basophils Absolute: 0 10*3/uL (ref 0.0–0.1)
Basophils Relative: 0 %
EOS ABS: 0.7 10*3/uL (ref 0.0–0.7)
Eosinophils Relative: 5 %
HEMATOCRIT: 46 % (ref 36.0–46.0)
HEMOGLOBIN: 15.6 g/dL — AB (ref 12.0–15.0)
LYMPHS ABS: 1.2 10*3/uL (ref 0.7–4.0)
Lymphocytes Relative: 8 %
MCH: 30.7 pg (ref 26.0–34.0)
MCHC: 33.9 g/dL (ref 30.0–36.0)
MCV: 90.6 fL (ref 78.0–100.0)
MONOS PCT: 13 %
Monocytes Absolute: 1.9 10*3/uL — ABNORMAL HIGH (ref 0.1–1.0)
NEUTROS PCT: 74 %
Neutro Abs: 10.7 10*3/uL — ABNORMAL HIGH (ref 1.7–7.7)
Platelets: 353 10*3/uL (ref 150–400)
RBC: 5.08 MIL/uL (ref 3.87–5.11)
RDW: 13.7 % (ref 11.5–15.5)
WBC: 14.4 10*3/uL — AB (ref 4.0–10.5)

## 2017-01-12 LAB — BLOOD GAS, ARTERIAL
ACID-BASE EXCESS: 0 mmol/L (ref 0.0–2.0)
Bicarbonate: 20.8 mmol/L (ref 20.0–28.0)
DRAWN BY: 270211
O2 Content: 2 L/min
O2 Saturation: 96 %
PCO2 ART: 25.2 mmHg — AB (ref 32.0–48.0)
PH ART: 7.527 — AB (ref 7.350–7.450)
Patient temperature: 98.6
pO2, Arterial: 80.4 mmHg — ABNORMAL LOW (ref 83.0–108.0)

## 2017-01-12 LAB — TROPONIN I

## 2017-01-12 LAB — BRAIN NATRIURETIC PEPTIDE: B NATRIURETIC PEPTIDE 5: 193.4 pg/mL — AB (ref 0.0–100.0)

## 2017-01-12 MED ORDER — ATORVASTATIN CALCIUM 10 MG PO TABS
5.0000 mg | ORAL_TABLET | Freq: Every day | ORAL | Status: DC
  Administered 2017-01-13 – 2017-01-14 (×2): 5 mg via ORAL
  Filled 2017-01-12 (×2): qty 1

## 2017-01-12 MED ORDER — LATANOPROST 0.005 % OP SOLN
1.0000 [drp] | Freq: Every day | OPHTHALMIC | Status: DC
  Administered 2017-01-12 – 2017-01-13 (×2): 1 [drp] via OPHTHALMIC
  Filled 2017-01-12: qty 2.5

## 2017-01-12 MED ORDER — BENZONATATE 100 MG PO CAPS: 100.0000 mg | ORAL_CAPSULE | Freq: Three times a day (TID) | ORAL | Status: DC | PRN

## 2017-01-12 MED ORDER — APIXABAN 2.5 MG PO TABS
2.5000 mg | ORAL_TABLET | Freq: Two times a day (BID) | ORAL | Status: DC
  Administered 2017-01-12 – 2017-01-14 (×4): 2.5 mg via ORAL
  Filled 2017-01-12 (×4): qty 1

## 2017-01-12 MED ORDER — ACETAMINOPHEN 500 MG PO TABS: 500.0000 mg | ORAL_TABLET | Freq: Four times a day (QID) | ORAL | Status: DC | PRN

## 2017-01-12 MED ORDER — VITAMIN E 45 MG (100 UNIT) PO CAPS
1000.0000 [IU] | ORAL_CAPSULE | Freq: Two times a day (BID) | ORAL | Status: DC
  Administered 2017-01-12 – 2017-01-14 (×4): 1000 [IU] via ORAL
  Filled 2017-01-12 (×4): qty 2

## 2017-01-12 MED ORDER — METHYLPREDNISOLONE SODIUM SUCC 125 MG IJ SOLR
60.0000 mg | INTRAMUSCULAR | Status: DC
  Administered 2017-01-13: 60 mg via INTRAVENOUS
  Filled 2017-01-12: qty 2

## 2017-01-12 MED ORDER — ALBUTEROL SULFATE HFA 108 (90 BASE) MCG/ACT IN AERS: 2.0000 | INHALATION_SPRAY | Freq: Four times a day (QID) | RESPIRATORY_TRACT | Status: DC | PRN

## 2017-01-12 MED ORDER — DILTIAZEM HCL 90 MG PO TABS
240.0000 mg | ORAL_TABLET | Freq: Every day | ORAL | Status: DC
  Administered 2017-01-13 – 2017-01-14 (×2): 240 mg via ORAL
  Filled 2017-01-12 (×2): qty 1

## 2017-01-12 MED ORDER — ALBUTEROL SULFATE (2.5 MG/3ML) 0.083% IN NEBU
2.5000 mg | INHALATION_SOLUTION | Freq: Once | RESPIRATORY_TRACT | Status: AC
  Administered 2017-01-12: 2.5 mg via RESPIRATORY_TRACT
  Filled 2017-01-12: qty 3

## 2017-01-12 MED ORDER — ALBUTEROL SULFATE (2.5 MG/3ML) 0.083% IN NEBU: 2.5000 mg | INHALATION_SOLUTION | Freq: Four times a day (QID) | RESPIRATORY_TRACT | Status: DC | PRN

## 2017-01-12 MED ORDER — TRAMADOL-ACETAMINOPHEN 37.5-325 MG PO TABS: 1.0000 | ORAL_TABLET | Freq: Two times a day (BID) | ORAL | Status: DC | PRN

## 2017-01-12 MED ORDER — VITAMIN D3 25 MCG (1000 UNIT) PO TABS
1000.0000 [IU] | ORAL_TABLET | Freq: Two times a day (BID) | ORAL | Status: DC
  Administered 2017-01-12 – 2017-01-14 (×4): 1000 [IU] via ORAL
  Filled 2017-01-12 (×4): qty 1

## 2017-01-12 MED ORDER — NAPHAZOLINE-GLYCERIN 0.012-0.2 % OP SOLN
1.0000 [drp] | Freq: Four times a day (QID) | OPHTHALMIC | Status: DC | PRN
  Filled 2017-01-12: qty 15

## 2017-01-12 MED ORDER — POTASSIUM CHLORIDE CRYS ER 20 MEQ PO TBCR
40.0000 meq | EXTENDED_RELEASE_TABLET | Freq: Once | ORAL | Status: AC
  Administered 2017-01-12: 40 meq via ORAL
  Filled 2017-01-12: qty 2

## 2017-01-12 MED ORDER — AZITHROMYCIN 250 MG PO TABS
500.0000 mg | ORAL_TABLET | Freq: Every day | ORAL | Status: DC
  Administered 2017-01-12 – 2017-01-13 (×2): 500 mg via ORAL
  Filled 2017-01-12 (×3): qty 2

## 2017-01-12 MED ORDER — UMECLIDINIUM BROMIDE 62.5 MCG/INH IN AEPB
1.0000 | INHALATION_SPRAY | Freq: Every day | RESPIRATORY_TRACT | Status: DC
  Administered 2017-01-13 – 2017-01-14 (×2): 1 via RESPIRATORY_TRACT
  Filled 2017-01-12: qty 7

## 2017-01-12 MED ORDER — FUROSEMIDE 20 MG PO TABS
20.0000 mg | ORAL_TABLET | Freq: Every day | ORAL | Status: DC
  Administered 2017-01-13 – 2017-01-14 (×2): 20 mg via ORAL
  Filled 2017-01-12 (×2): qty 1

## 2017-01-12 MED ORDER — POTASSIUM CHLORIDE CRYS ER 10 MEQ PO TBCR
10.0000 meq | EXTENDED_RELEASE_TABLET | Freq: Two times a day (BID) | ORAL | Status: DC
  Administered 2017-01-12: 10 meq via ORAL
  Filled 2017-01-12: qty 1

## 2017-01-12 MED ORDER — CEFTRIAXONE SODIUM 1 G IJ SOLR
1.0000 g | INTRAMUSCULAR | Status: DC
  Administered 2017-01-12 – 2017-01-13 (×2): 1 g via INTRAVENOUS
  Filled 2017-01-12 (×2): qty 10

## 2017-01-12 MED ORDER — ENSURE ENLIVE PO LIQD
237.0000 mL | Freq: Two times a day (BID) | ORAL | Status: DC
  Administered 2017-01-13: 237 mL via ORAL

## 2017-01-12 MED ORDER — METHYLPREDNISOLONE SODIUM SUCC 125 MG IJ SOLR
125.0000 mg | Freq: Once | INTRAMUSCULAR | Status: AC
  Administered 2017-01-12: 125 mg via INTRAVENOUS
  Filled 2017-01-12: qty 2

## 2017-01-12 NOTE — ED Notes (Signed)
ADMISSION MD MADE AWARE OF PT'S ANXIETY EPISODES AND ABILITY TO BE DISTRACTED WITH BIOFEEDBACK AND IMPROVEMENTS WITH THESE MEASURES. ADMISSION MD WITNESSED PT'S EPISODES.

## 2017-01-12 NOTE — Care Management (Signed)
CM consult reviewed. Patient is being admitted. Care Management will follow for discharge needs. Kaytelyn Glore RN CCM 

## 2017-01-12 NOTE — ED Triage Notes (Signed)
Per EMS, pt from PCP. Pt has worse SOB than normal. Pt tried albuterol inhaler, which she states has not resolved it. Pt was 82% on 2L. Pt was 100% on 4L. Pt states she has finished azithromycin 2 days ago.

## 2017-01-12 NOTE — ED Notes (Signed)
PT WITH INCREASED ANXIETY. SHALLOW BREATHING. ENCOURAGED TO SLOW BREATHING. USED IMAGERY AND BIOFEEDBACK. PT ABLE TO DEMONSTRATES WITHOUT DIFFICULTY. PT WITHOUT CHEST PAIN AND DECREASES RR AND INCREASES 02SATS

## 2017-01-12 NOTE — ED Notes (Signed)
ED Provider at bedside. 

## 2017-01-12 NOTE — ED Notes (Signed)
ADMISSION Provider at bedside. FAMILY AWARE OF ADMISSION

## 2017-01-12 NOTE — ED Notes (Signed)
Patient transported to X-ray 

## 2017-01-12 NOTE — ED Notes (Signed)
PT WITH INCREASED ANXIETY . HYPERVENTILATING WITH SHALLOW BREATHING.  ENCOURAGED TO SLOW BREATHING AND TAKE DEEPER BREATHS. PT ABLE TO DEMONSTRATE. PT IMPROVES ABILITY WITH DETRACTIONS.

## 2017-01-12 NOTE — ED Provider Notes (Addendum)
[facility]-EMERGENCY DEPT Provider Note   CSN: [id number] Arrival date & time: 01/12/17  1252     History   Chief Complaint Chief Complaint  Patient presents with  . Shortness of Breath    HPI [name] is a 83 y.o. female. Chief complaint is shortness of breath.  HPI :  This is an 83 year old female with COPD, atrial fib/flutter, CKD, chronic dyspnea, hypercholesterolemia, peripheral vascular disease, PMR.  She is followed by Dr. Suzanna Obey of our pulmonary. Recently finished Zithromax, and prednisone taper on Friday, 3 days ago.  Became acutely more dyspneic Friday evening. Apparently has been at home. Has been minimally ambulatory due to dyspnea. In reading her most recent notes she is chronically on 2 L but at times has been on 3 or 4 L at home.Was on 2 L today and was 82% at her primary care physician's office. Dr. Nila Nephew. He felt she required admission for her exacerbation. She was brought here by ambulance.  Her husband, retired internal medicine physician, accompanies her here.  She has had increased sputum. Has not been discolored. No fever. No chest pain. No leg swelling. Nebulized albuterol treatment this morning prior to her primary care visit.  Past Medical History:  Diagnosis Date  . Anticoagulated    Eliquis  . Atrial flutter (HCC)    NO CARDIOLOGIST FOLLOWED BY DR Tori Milks  . Bloody stools    LAST 4 DAYS  . CHF (congestive heart failure) (HCC)   . Chronic back pain   . CKD (chronic kidney disease), stage III (HCC)    Creatinine 1.35 -1.55  . COPD (chronic obstructive pulmonary disease) (HCC)   . Diverticulosis   . Dyspnea   . Dysrhythmia   . Edema   . Gait disorder   . Glaucoma    Severe  . Glaucoma    both eyes  . High cholesterol   . Multinodular goiter   . PAOD (peripheral arterial occlusive disease) (HCC)    Stein in right groin, decrease pulses  . PMR (polymyalgia rheumatica) (HCC)   . Pneumonia, organism unspecified(486) 04/18/2016  .  Protein-calorie malnutrition (HCC)   . Rash of back   . Weakness     Patient Active Problem List   Diagnosis Date Noted  . Acute on chronic respiratory failure (HCC) 01/01/2017  . COPD exacerbation (HCC) 09/05/2016  . Malnutrition of moderate degree 07/31/2016  . CRI (chronic renal insufficiency), stage 4 (severe) (HCC)   . Acute on chronic combined systolic and diastolic CHF (congestive heart failure) (HCC)   . Respiratory distress 07/29/2016  . Acute respiratory distress 07/29/2016  . CAD (coronary artery disease) 07/29/2016  . Chronic musculoskeletal pain 07/29/2016  . COPD without exacerbation (HCC) 07/29/2016  . Acute diastolic (congestive) heart failure (HCC)   . Chronic respiratory failure with hypoxia (HCC) 05/09/2016  . COPD (chronic obstructive pulmonary disease) (HCC) 05/09/2016  . Thrush, oral 05/02/2016  . Dyspnea 04/25/2016  . Gait disorder   . Weakness   . CKD (chronic kidney disease), stage III (HCC)   . Protein-calorie malnutrition (HCC)   . Multinodular goiter   . PAOD (peripheral arterial occlusive disease) (HCC)   . Chronic back pain   . CHF (congestive heart failure) (HCC)   . Atrial flutter (HCC)   . Edema   . Glaucoma   . High cholesterol   . PMR (polymyalgia rheumatica) (HCC)   . CAP (community acquired pneumonia)   . Paroxysmal atrial fibrillation (HCC) 04/14/2016  . Chronic  anticoagulation 04/14/2016  . Diverticulosis of colon (without mention of hemorrhage) 07/15/2013  . Arthropathy 04/06/2007  . ESOPHAGITIS 03/29/2007    Past Surgical History:  Procedure Laterality Date  . COLONOSCOPY N/A 07/15/2013   Procedure: COLONOSCOPY;  Surgeon: Louis Meckel, MD;  Location: [facility] ENDOSCOPY;  Service: Endoscopy;  Laterality: N/A;  . ESOPHAGOGASTRODUODENOSCOPY N/A 07/08/2013   Procedure: ESOPHAGOGASTRODUODENOSCOPY (EGD);  Surgeon: Louis Meckel, MD;  Location: [facility];  Service: Endoscopy;  Laterality: N/A;  Per Connye Burkitt, will try to have CRNA  available, but will not guaranteee. Ok to schedule per Clydie Braun 07/06/13  . GALLBLADDER SURGERY    . IR GENERIC HISTORICAL  05/18/2014   IR RADIOLOGIST EVAL & MGMT 05/18/2014 Berdine Dance, MD [facility] INTERV RAD  . LAMINECTOMY    . leg stent Right   . SHOULDER SURGERY     frozen shoulder/ Bil shoulders    OB History    No data available       Home Medications    Prior to Admission medications   Medication Sig Start Date End Date Taking? Authorizing Provider  acetaminophen (TYLENOL) 500 MG tablet Take 500-1,000 mg by mouth every 4 (four) hours as needed for mild pain.    Yes [provider]  albuterol (PROAIR HFA) 108 (90 Base) MCG/ACT inhaler Inhale 2 puffs into the lungs every 6 (six) hours as needed for wheezing or shortness of breath. 07/18/16  Yes Coralyn Helling, MD  albuterol (PROVENTIL) (2.5 MG/3ML) 0.083% nebulizer solution Take 3 mLs (2.5 mg total) by nebulization every 6 (six) hours as needed for wheezing or shortness of breath. DX: J44.9 01/07/17  Yes Bevelyn Ngo, NP  atorvastatin (LIPITOR) 10 MG tablet Take 5 mg by mouth daily.    Yes [provider]  bimatoprost (LUMIGAN) 0.01 % SOLN Place 1 drop into both eyes at bedtime.   Yes [provider]  cholecalciferol (VITAMIN D) 1000 UNITS tablet Take 1,000 Units by mouth 2 (two) times daily.    Yes [provider]  diltiazem (CARDIZEM) 120 MG tablet Take 240 mg by mouth daily.    Yes [provider]  ELIQUIS 2.5 MG TABS tablet Take 2.5 mg by mouth 2 (two) times daily. 04/11/16  Yes [provider]  feeding supplement, ENSURE ENLIVE, (ENSURE ENLIVE) LIQD Take 237 mLs by mouth 2 (two) times daily between meals. 08/01/16  Yes Ghimire, Werner Lean, MD  furosemide (LASIX) 20 MG tablet Take 1 tablet (20 mg total) by mouth daily. 09/08/16  Yes Randel Pigg, Dorma Russell, MD  OXYGEN Inhale 2 L into the lungs daily.   Yes [provider]  potassium chloride (K-DUR,KLOR-CON) 10 MEQ tablet Take 10  mEq by mouth 2 (two) times daily.   Yes [provider]  predniSONE (DELTASONE) 10 MG tablet Take 4 tabs for 2 days, then 3 tabs for 2 days, 2 tabs for 2 days, then 1 tab for 2 days, then stop. 01/07/17  Yes Bevelyn Ngo, NP  tetrahydrozoline 0.05 % ophthalmic solution Place 1 drop into both eyes 4 (four) times daily as needed (For dry eyes.).   Yes [provider]  traMADol-acetaminophen (ULTRACET) 37.5-325 MG per tablet Take 1-2 tablets by mouth 2 (two) times daily as needed for moderate pain.  06/01/13  Yes [provider]  umeclidinium bromide (INCRUSE ELLIPTA) 62.5 MCG/INH AEPB Inhale 1 puff into the lungs daily.   Yes [provider]  vitamin E 1000 UNIT capsule Take 1,000 Units by mouth 2 (  two) times daily.    Yes [provider]  azithromycin (ZITHROMAX) 250 MG tablet Take 2 pills today then one a day for 4 additional days Patient not taking: Reported on 01/12/2017 01/07/17   Bevelyn Ngo, NP  benzonatate (TESSALON) 100 MG capsule Take 1 capsule (100 mg total) by mouth 3 (three) times daily as needed for cough. 09/08/16   Lenox Ponds, MD    Family History Family History  Problem Relation Age of Onset  . Throat cancer Mother   . Diabetes Maternal Uncle   . Throat cancer Maternal Grandfather   . Cirrhosis Maternal Aunt     Social History Social History  Substance Use Topics  . Smoking status: Former Smoker    Types: Cigarettes    Quit date: 07/12/1993  . Smokeless tobacco: Never Used  . Alcohol use No     Allergies   Codeine sulfate   Review of Systems Review of Systems  Constitutional: Negative for appetite change, chills, diaphoresis, fatigue and fever.  HENT: Negative for mouth sores, sore throat and trouble swallowing.   Eyes: Negative for visual disturbance.  Respiratory: Positive for cough and shortness of breath. Negative for chest tightness and wheezing.   Cardiovascular: Negative for chest pain.    Gastrointestinal: Negative for abdominal distention, abdominal pain, diarrhea, nausea and vomiting.  Endocrine: Negative for polydipsia, polyphagia and polyuria.  Genitourinary: Negative for dysuria, frequency and hematuria.  Musculoskeletal: Negative for gait problem.  Skin: Negative for color change, pallor and rash.  Neurological: Negative for dizziness, syncope, light-headedness and headaches.  Hematological: Does not bruise/bleed easily.  Psychiatric/Behavioral: Negative for behavioral problems and confusion.     Physical Exam Updated Vital Signs BP 121/71   Pulse 81   Temp (!) 97.4 F (36.3 C) (Oral)   Resp (!) 24   SpO2 100%   Physical Exam  Constitutional: She is oriented to person, place, and time. She appears well-developed and well-nourished. No distress.  HENT:  Head: Normocephalic.  Eyes: Pupils are equal, round, and reactive to light. Conjunctivae are normal. No scleral icterus.  Neck: Normal range of motion. Neck supple. No thyromegaly present.  Cardiovascular: Normal rate and regular rhythm.  Exam reveals no gallop and no friction rub.   No murmur heard. No gallop rhythm.  Pulmonary/Chest: No respiratory distress. She has no wheezes. She has no rales.  Tachypneic. No marked increased work of breathing. Globally diminished breath sounds. Mild end expiratory wheeze.  Abdominal: Soft. Bowel sounds are normal. She exhibits no distension. There is no tenderness. There is no rebound.  Musculoskeletal: Normal range of motion.  Neurological: She is alert and oriented to person, place, and time.  Skin: Skin is warm and dry. No rash noted.  No dependent edema.  Psychiatric: She has a normal mood and affect. Her behavior is normal.     ED Treatments / Results  Labs (all labs ordered are listed, but only abnormal results are displayed) Labs Reviewed  CBC WITH DIFFERENTIAL/PLATELET - Abnormal; Notable for the following:       Result Value   WBC 14.4 (*)     Hemoglobin 15.6 (*)    Neutro Abs 10.7 (*)    Monocytes Absolute 1.9 (*)    All other components within normal limits  BASIC METABOLIC PANEL - Abnormal; Notable for the following:    Potassium 3.4 (*)    Chloride 98 (*)    BUN 30 (*)    Creatinine, Ser 1.90 (*)  GFR calc non Af Amer 23 (*)    GFR calc Af Amer 26 (*)    Anion gap 16 (*)    All other components within normal limits  BLOOD GAS, ARTERIAL - Abnormal; Notable for the following:    pH, Arterial 7.527 (*)    pCO2 arterial 25.2 (*)    pO2, Arterial 80.4 (*)    All other components within normal limits  TROPONIN I  BRAIN NATRIURETIC PEPTIDE    EKG  EKG Interpretation None       Radiology Dg Chest 2 View  Result Date: 01/12/2017 CLINICAL DATA:  Shortness of breath EXAM: CHEST  2 VIEW COMPARISON:  12/31/2016 FINDINGS: Lungs are hyperexpanded. Interstitial markings are diffusely coarsened with chronic features. The cardiopericardial silhouette is within normal limits for size. The visualized bony structures of the thorax are intact. Telemetry leads overlie the chest. IMPRESSION: Features suggest emphysema.  No acute cardiopulmonary findings. Electronically Signed   By: Kennith Center M.D.   On: 01/12/2017 15:11    Procedures Procedures (including critical care time)  Medications Ordered in ED Medications  albuterol (PROVENTIL) (2.5 MG/3ML) 0.083% nebulizer solution 2.5 mg (2.5 mg Nebulization Given 01/12/17 1509)  methylPREDNISolone sodium succinate (SOLU-MEDROL) 125 mg/2 mL injection 125 mg (125 mg Intravenous Given 01/12/17 1519)     Initial Impression / Assessment and Plan / ED Course  I have reviewed the triage vital signs and the nursing notes.  Pertinent labs & imaging results that were available during my care of the patient were reviewed by me and considered in my medical decision making (see chart for details).   patient given nebulas Toprol. Chest x-ray shows no acute findings. Does show signs of  emphysema. No fluid, infiltrate or new changes. This may be simple exacerbation of her COPD. She is requiring 3-4 L of O2 to maintain saturations consistently above 90.  Weight completion labs. Her EKG does not show acute changes. She has an atrial flutter with controlled rate, and variable block. Clinically and radiographically not in congestive heart failure. Troponin, and d-dimer pending.  Final Clinical Impressions(s) / ED Diagnoses   Final diagnoses:  COPD exacerbation [facility])    New Prescriptions New Prescriptions   No medications on file     Rolland Porter, MD 01/12/17 1627    Rolland Porter, MD 01/12/17 1651

## 2017-01-12 NOTE — H&P (Signed)
History and Physical  [name] ZOX:[id number] DOB: 1927/04/21 DOA: 01/12/2017  Referring physician: EDP PCP: Coralyn Helling, MD   Chief Complaint: sob, hypoxia  HPI: [name] is a 83 y.o. female   With h/o afib/flutter, copd, diastolic chf, chronic hypoxic respiratory failure on home o2 2liters, she is sent from pcp office due to hypoxia , 02 sats 82 on 2liters.  Patient report was seen at pulmonary clinic twice recently for sob, she finished two coursed of abx with doxycycline and zpack. Patient reports she feels better while taking abx, she become sob once finish abx. She is also started on a  Steroids taper dose  which made her nervous, so she stopped prednisione on Friday. On Friday, patient reports one episode of presyncope, she just feel bad and weak, she felt better after laying in bed. She was seen by her pcp today, and found to have hypoxia. She is sent to the ED for further eval. She denies fever, no URI symptom, no chest pain, no lower extremity edema. There is documented increase sputum production, but daughter reports there is no cough, may concerns if the presyncope episode on Friday and dyspnea with increased oxygen requirement and generalized weakness for the last few weeks.   ED course: patient seems to be anxious with shallow breathing, she dose has tachypnea, RR range from 22-32, no hypoxia on 2liters, bp stable, in afib/rate controlled. abg with PH 8.5, pa02 80, pco2 25. cxr no acute infiltrates, ekg with afib with prolonged Qtc, no acute st/t changes, troponin negative, wbc 14.4, k 3.4, bun 30/cr 1.90 with is close to baseline. She is given solumedrol  x1 and albuterol in the ED, hospitalist called to observe the patient.   Review of Systems:  Detail per HPI, Review of systems are otherwise negative  Past Medical History:  Diagnosis Date  . Anticoagulated    Eliquis  . Atrial flutter (HCC)    NO CARDIOLOGIST FOLLOWED BY DR Tori Milks  . Bloody stools    LAST 4 DAYS  . CHF (congestive heart failure) (HCC)   . Chronic back pain   . CKD (chronic kidney disease), stage III (HCC)    Creatinine 1.35 -1.55  . COPD (chronic obstructive pulmonary disease) (HCC)   . Diverticulosis   . Dyspnea   . Dysrhythmia   . Edema   . Gait disorder   . Glaucoma    Severe  . Glaucoma    both eyes  . High cholesterol   . Multinodular goiter   . PAOD (peripheral arterial occlusive disease) (HCC)    Stein in right groin, decrease pulses  . PMR (polymyalgia rheumatica) (HCC)   . Pneumonia, organism unspecified(486) 04/18/2016  . Protein-calorie malnutrition (HCC)   . Rash of back   . Weakness    Past Surgical History:  Procedure Laterality Date  . COLONOSCOPY N/A 07/15/2013   Procedure: COLONOSCOPY;  Surgeon: Louis Meckel, MD;  Location: [facility] ENDOSCOPY;  Service: Endoscopy;  Laterality: N/A;  . ESOPHAGOGASTRODUODENOSCOPY N/A 07/08/2013   Procedure: ESOPHAGOGASTRODUODENOSCOPY (EGD);  Surgeon: Louis Meckel, MD;  Location: [facility];  Service: Endoscopy;  Laterality: N/A;  Per Connye Burkitt, will try to have CRNA available, but will not guaranteee. Ok to schedule per Clydie Braun 07/06/13  . GALLBLADDER SURGERY    . IR GENERIC HISTORICAL  05/18/2014   IR RADIOLOGIST EVAL & MGMT 05/18/2014 Berdine Dance, MD [facility] INTERV RAD  . LAMINECTOMY    . leg stent Right   .  SHOULDER SURGERY     frozen shoulder/ Bil shoulders   Social History:  reports that she quit smoking about 23 years ago. Her smoking use included Cigarettes. She has never used smokeless tobacco. She reports that she does not drink alcohol or use drugs. Patient lives at independent living ( friend's home) & is able to participate in activities of daily living independently   Allergies  Allergen Reactions  . Codeine Sulfate Nausea And Vomiting and Other (See Comments)    headache    Family History  Problem Relation Age of Onset  . Throat cancer Mother   . Diabetes Maternal Uncle   . Throat  cancer Maternal Grandfather   . Cirrhosis Maternal Aunt       Prior to Admission medications   Medication Sig Start Date End Date Taking? Authorizing Provider  acetaminophen (TYLENOL) 500 MG tablet Take 500-1,000 mg by mouth every 4 (four) hours as needed for mild pain.    Yes [provider]  albuterol (PROAIR HFA) 108 (90 Base) MCG/ACT inhaler Inhale 2 puffs into the lungs every 6 (six) hours as needed for wheezing or shortness of breath. 07/18/16  Yes Coralyn Helling, MD  albuterol (PROVENTIL) (2.5 MG/3ML) 0.083% nebulizer solution Take 3 mLs (2.5 mg total) by nebulization every 6 (six) hours as needed for wheezing or shortness of breath. DX: J44.9 01/07/17  Yes Bevelyn Ngo, NP  atorvastatin (LIPITOR) 10 MG tablet Take 5 mg by mouth daily.    Yes [provider]  bimatoprost (LUMIGAN) 0.01 % SOLN Place 1 drop into both eyes at bedtime.   Yes [provider]  cholecalciferol (VITAMIN D) 1000 UNITS tablet Take 1,000 Units by mouth 2 (two) times daily.    Yes [provider]  diltiazem (CARDIZEM) 120 MG tablet Take 240 mg by mouth daily.    Yes [provider]  ELIQUIS 2.5 MG TABS tablet Take 2.5 mg by mouth 2 (two) times daily. 04/11/16  Yes [provider]  feeding supplement, ENSURE ENLIVE, (ENSURE ENLIVE) LIQD Take 237 mLs by mouth 2 (two) times daily between meals. 08/01/16  Yes Ghimire, Werner Lean, MD  furosemide (LASIX) 20 MG tablet Take 1 tablet (20 mg total) by mouth daily. 09/08/16  Yes Randel Pigg, Dorma Russell, MD  OXYGEN Inhale 2 L into the lungs daily.   Yes [provider]  potassium chloride (K-DUR,KLOR-CON) 10 MEQ tablet Take 10 mEq by mouth 2 (two) times daily.   Yes [provider]  predniSONE (DELTASONE) 10 MG tablet Take 4 tabs for 2 days, then 3 tabs for 2 days, 2 tabs for 2 days, then 1 tab for 2 days, then stop. 01/07/17  Yes Bevelyn Ngo, NP  tetrahydrozoline 0.05 % ophthalmic solution Place 1 drop into both eyes  4 (four) times daily as needed (For dry eyes.).   Yes [provider]  traMADol-acetaminophen (ULTRACET) 37.5-325 MG per tablet Take 1-2 tablets by mouth 2 (two) times daily as needed for moderate pain.  06/01/13  Yes [provider]  umeclidinium bromide (INCRUSE ELLIPTA) 62.5 MCG/INH AEPB Inhale 1 puff into the lungs daily.   Yes [provider]  vitamin E 1000 UNIT capsule Take 1,000 Units by mouth 2 (two) times daily.    Yes [provider]  azithromycin (ZITHROMAX) 250 MG tablet Take 2 pills today then one a day for 4 additional days Patient not taking: Reported on 01/12/2017 01/07/17   Bevelyn Ngo, NP  benzonatate (TESSALON) 100 MG  capsule Take 1 capsule (100 mg total) by mouth 3 (three) times daily as needed for cough. 09/08/16   Lenox Ponds, MD    Physical Exam: BP 111/80   Pulse 89   Temp (!) 97.4 F (36.3 C) (Oral)   Resp (!) 25   SpO2 94%   General:  Mild anxious, shallow breathing, otherwise stable Eyes: PERRL ENT: unremarkable Neck: supple, no JVD Cardiovascular: IRRR Respiratory: overall diminished, no wheezing, no rales Abdomen: soft/ND/ND, positive bowel sounds Skin: no rash Musculoskeletal:  No edema Psychiatric: mild anxious,  Neurologic: no focal findings            Labs on Admission:  Basic Metabolic Panel:  Recent Labs Lab 01/12/17 1507  NA 137  K 3.4*  CL 98*  CO2 23  GLUCOSE 92  BUN 30*  CREATININE 1.90*  CALCIUM 9.0   Liver Function Tests: No results for input(s): AST, ALT, ALKPHOS, BILITOT, PROT, ALBUMIN in the last 168 hours. No results for input(s): LIPASE, AMYLASE in the last 168 hours. No results for input(s): AMMONIA in the last 168 hours. CBC:  Recent Labs Lab 01/12/17 1507  WBC 14.4*  NEUTROABS 10.7*  HGB 15.6*  HCT 46.0  MCV 90.6  PLT 353   Cardiac Enzymes:  Recent Labs Lab 01/12/17 1507  TROPONINI <0.03    BNP (last 3 results)  Recent Labs  07/29/16 0941 09/05/16 1919  01/12/17 1507  BNP 218.4* 416.5* 193.4*    ProBNP (last 3 results) No results for input(s): PROBNP in the last 8760 hours.  CBG: No results for input(s): GLUCAP in the last 168 hours.  Radiological Exams on Admission: Dg Chest 2 View  Result Date: 01/12/2017 CLINICAL DATA:  Shortness of breath EXAM: CHEST  2 VIEW COMPARISON:  12/31/2016 FINDINGS: Lungs are hyperexpanded. Interstitial markings are diffusely coarsened with chronic features. The cardiopericardial silhouette is within normal limits for size. The visualized bony structures of the thorax are intact. Telemetry leads overlie the chest. IMPRESSION: Features suggest emphysema.  No acute cardiopulmonary findings. Electronically Signed   By: Kennith Center M.D.   On: 01/12/2017 15:11   Ct Chest Wo Contrast  Result Date: 01/12/2017 CLINICAL DATA:  83 year old female with chronic dyspnea. Subsequent encounter. EXAM: CT CHEST WITHOUT CONTRAST TECHNIQUE: Multidetector CT imaging of the chest was performed following the standard protocol without IV contrast. COMPARISON:  01/12/2017 chest x-ray.  07/29/2016 chest CT. FINDINGS: Cardiovascular: Calcified aorta and great vessels. 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 Top-normal heart size. Three vessel coronary artery calcification. Mediastinum/Nodes: Top-normal size precarinal lymph nodes unchanged. Lungs/Pleura: Moderate to marked centrilobular emphysema. No infiltrate or pulmonary edema. Scarring lung apices. Upper Abdomen: No acute abnormality upper abdomen. Musculoskeletal: Scoliosis and superimposed degenerative changes similar to prior exam without focal compression fracture. IMPRESSION: Moderate-to-marked emphysema without change. No acute pulmonary abnormality. Coronary artery calcifications. Aortic Atherosclerosis (ICD10-I70.0).  Emphysema (ICD10-J43.9). Electronically Signed   By: Lacy Duverney M.D.   On: 01/12/2017 18:06    Assessment/Plan Present on  Admission: . Dyspnea    Acute on chronic hypoxia respiratory failure:  -she started to have dyspnea and was treated with two course of abx and recently started on steroids taper on 10/3, she stopped taking steroids on 10/5 due to side effect (anxious, not able to sleep well).  -she does not have significant wheezing, no significant cough on exam, but she is partially treated. Will get respiratory vital panel, sputum culture if productive , mrsa screening, urine strep  pnuemon antigen -will get ct chest/echo cardiogram,  -continue iv steroids, nebs, start rocephin/zitrhomax   COPD exacerbation?  -Partially treated, no significant physical exam findings, will continue steroids/abx/nebs -Get ct chest -she has a pulmonology clinic follow up on 10/10, hopefully can be discharged on 10/9 and keep the appointment. chronic abx suppression?    Presyncope? : (one episode, sudden onset on Friday) Troponin negative, keep on tele, less likely PE being on chronic anticoagulation. Will get echocardiogram Consider outpatient cardiac monitoring  Hypokalemia: k 3.4 on presentation, replace k, check mag  Diastolic CHF : stable, euvolemic on exam, continue home meds  Chronic afib/flutter: rate controlled , continue home meds cardizem/apixaban  FTT: generalized weakness: will get PT eval, may benefit home health.   DVT prophylaxis: on apixaban  Consultants: none  Code Status: DNR  Family Communication:  Patient , husband and daughter at bedside  Disposition Plan: obs, tele  Time spent:  Undray Allman MD, PhD [facility] Pager [phone] If 7PM-7AM, please contact night-coverage at [email or web address], password [facility]

## 2017-01-12 NOTE — ED Notes (Signed)
Call report to Merlyn Albert, RN at [phone] at [phone]

## 2017-01-12 NOTE — ED Notes (Signed)
Patient transported to CT 

## 2017-01-12 NOTE — ED Notes (Signed)
Bed: [facility] Expected date:  Expected time:  Means of arrival:  Comments: 

## 2017-01-13 ENCOUNTER — Observation Stay (HOSPITAL_BASED_OUTPATIENT_CLINIC_OR_DEPARTMENT_OTHER): Payer: Medicare Other

## 2017-01-13 DIAGNOSIS — I361 Nonrheumatic tricuspid (valve) insufficiency: Secondary | ICD-10-CM

## 2017-01-13 DIAGNOSIS — E785 Hyperlipidemia, unspecified: Secondary | ICD-10-CM | POA: Diagnosis not present

## 2017-01-13 DIAGNOSIS — R55 Syncope and collapse: Secondary | ICD-10-CM

## 2017-01-13 DIAGNOSIS — R0609 Other forms of dyspnea: Secondary | ICD-10-CM | POA: Diagnosis not present

## 2017-01-13 DIAGNOSIS — J9621 Acute and chronic respiratory failure with hypoxia: Secondary | ICD-10-CM | POA: Diagnosis not present

## 2017-01-13 DIAGNOSIS — R42 Dizziness and giddiness: Secondary | ICD-10-CM

## 2017-01-13 DIAGNOSIS — J441 Chronic obstructive pulmonary disease with (acute) exacerbation: Secondary | ICD-10-CM

## 2017-01-13 LAB — BASIC METABOLIC PANEL
Anion gap: 11 (ref 5–15)
BUN: 36 mg/dL — AB (ref 6–20)
CALCIUM: 9 mg/dL (ref 8.9–10.3)
CO2: 23 mmol/L (ref 22–32)
Chloride: 103 mmol/L (ref 101–111)
Creatinine, Ser: 1.87 mg/dL — ABNORMAL HIGH (ref 0.44–1.00)
GFR calc Af Amer: 27 mL/min — ABNORMAL LOW (ref >60)
GFR, EST NON AFRICAN AMERICAN: 23 mL/min — AB (ref >60)
GLUCOSE: 152 mg/dL — AB (ref 65–99)
POTASSIUM: 5.3 mmol/L — AB (ref 3.5–5.1)
Sodium: 137 mmol/L (ref 135–145)

## 2017-01-13 LAB — RESPIRATORY PANEL BY PCR
ADENOVIRUS-RVPPCR: NOT DETECTED
Bordetella pertussis: NOT DETECTED
CHLAMYDOPHILA PNEUMONIAE-RVPPCR: NOT DETECTED
CORONAVIRUS 229E-RVPPCR: NOT DETECTED
CORONAVIRUS HKU1-RVPPCR: NOT DETECTED
CORONAVIRUS NL63-RVPPCR: NOT DETECTED
Coronavirus OC43: NOT DETECTED
Influenza A: NOT DETECTED
Influenza B: NOT DETECTED
MYCOPLASMA PNEUMONIAE-RVPPCR: NOT DETECTED
Metapneumovirus: NOT DETECTED
PARAINFLUENZA VIRUS 4-RVPPCR: NOT DETECTED
Parainfluenza Virus 1: NOT DETECTED
Parainfluenza Virus 2: NOT DETECTED
Parainfluenza Virus 3: NOT DETECTED
Respiratory Syncytial Virus: NOT DETECTED
Rhinovirus / Enterovirus: NOT DETECTED

## 2017-01-13 LAB — MAGNESIUM: Magnesium: 2.3 mg/dL (ref 1.7–2.4)

## 2017-01-13 LAB — ECHOCARDIOGRAM COMPLETE
HEIGHTINCHES: 63 in
Weight: 1764.8 oz

## 2017-01-13 LAB — STREP PNEUMONIAE URINARY ANTIGEN: STREP PNEUMO URINARY ANTIGEN: NEGATIVE

## 2017-01-13 MED ORDER — FUROSEMIDE 10 MG/ML IJ SOLN
40.0000 mg | Freq: Once | INTRAMUSCULAR | Status: AC
  Administered 2017-01-13: 40 mg via INTRAVENOUS
  Filled 2017-01-13: qty 4

## 2017-01-13 MED ORDER — PREDNISONE 5 MG PO TABS
30.0000 mg | ORAL_TABLET | Freq: Every day | ORAL | Status: DC
  Administered 2017-01-14: 09:00:00 30 mg via ORAL
  Filled 2017-01-13: qty 1

## 2017-01-13 NOTE — Care Management Note (Signed)
Case Management Note  Patient Details  Name: [name] MRN: [id number] Date of Birth: [birth year]  Subjective/Objective: 83 y/o f admitted w/Dyspnea. From [facility]. PT recc HHPT. [facility] is the contract HHC agency @ [facility]-patient in agreement to use them. Will need HHPT order.                   Action/Plan:d/c home w/HHC.   Expected Discharge Date:   (unknown)               Expected Discharge Plan:  Home w Home Health Services  In-House Referral:     Discharge planning Services  CM Consult  Post Acute Care Choice:  Durable Medical Equipment (rw, home [facility],has travel tank) Choice offered to:     DME Arranged:    DME Agency:     HH Arranged:    HH Agency:     Status of Service:  In process, will continue to follow  If discussed at Long Length of Stay Meetings, dates discussed:    Additional Comments:  Lanier Clam, RN 01/13/2017, 3:10 PM

## 2017-01-13 NOTE — Progress Notes (Signed)
[facility] PROGRESS NOTE  [name] ZOX:[id number] DOB: 1927-09-09 DOA: 01/12/2017 PCP: Coralyn Helling, MD  Interim summary and HPI With h/o afib/flutter, copd, diastolic chf, chronic hypoxic respiratory failure on home o2 2liters, she is sent from pcp office due to hypoxia , 02 sats 82 on 2liters.  Patient report was seen at pulmonary clinic twice recently for sob, she finished two coursed of abx with doxycycline and zpack. Patient reports she feels better while taking abx, she become sob once finish abx. She is also started on a  Steroids taper dose  which made her nervous, so she stopped prednisone before finishing tapering.  Assessment/Plan: Acute on chronic resp failure -most likely secondary to COPD -partially treated exacerbation and new progression baseline for her disease. -will continue oxygen supplementation (aiming for sat goal of 88-89%) -continue steroids tapering as instructed by pulmonologist  (slow tapered starting a  and going down by 10 every 5 days) -will continue lasix PO -no signs of fluid overload on exam and no vascular congestion on CXR/CT -continue home inhaler therapy  Near syncope event -continue monitoring on telemetry -so far work up neg -cardiology consulted -will benefit of event monitoring   Chronic atrial fibrillation -will continue cardizem and Eliquis -CHADsVASC score 4  Hypokalemia -repleted -in fact slight hyperkalemia today -will monitor trend   Physical deconditioning  -will ask PT to evaluate and provide rec's  HLD -continue statins.  Code Status:DNR Family Communication: husband at bedside Disposition Plan: remains inpatient, will continue steroids; stop antibiotics and continue Po lasix. Will follow rec's from cardiology and pulmonary service.   Consultants:  PCCM  Cardiology   Procedures:  2-D echo  - Left ventricle: The cavity size was normal. Systolic function was   normal. The estimated ejection  fraction was in the range of 60%   to 65%. Wall motion was normal; there were no regional wall   motion abnormalities. Features are consistent with a pseudonormal   left ventricular filling pattern, with concomitant abnormal   relaxation and increased filling pressure (grade 2 diastolic   dysfunction). - Aortic valve: Trileaflet; mildly thickened, mildly calcified   leaflets. - Mitral valve: There was mild regurgitation. - Tricuspid valve: There was mild regurgitation. - Pulmonic valve: Cusp separation was normal. - Pulmonary arteries: Systolic pressure could not be accurately   estimated.  Antibiotics:  Rocephin and zithromax 10/7>>>10/8  HPI/Subjective: Afebrile, still feeling SOB and weak; no CP, no nausea, no vomiting and no abd pain.  Objective: Vitals:   01/13/17 1426 01/13/17 2010  BP: 140/76 (!) 142/67  Pulse: 74 68  Resp: 18 18  Temp: (!) 97.5 F (36.4 C) 98.4 F (36.9 C)  SpO2: 94% 97%    Intake/Output Summary (Last 24 hours) at 01/13/17 2243 Last data filed at 01/13/17 2057  Gross per 24 hour  Intake              720 ml  Output             1600 ml  Net             -880 ml   Filed Weights   01/12/17 2014  Weight: 50 kg (110 lb 4.8 oz)    Exam:   General: afebrile, reporting SOB and feeling weak. No further syncope episode.  Cardiovascular: rate control, no rubs, no gallops, no JVD.   Respiratory: scattered rhonchi, very little wheezing, no crackles, no using accessory muscles..  Abdomen: soft, NT, ND, positive BS  Musculoskeletal:  no ede,a. No cyanosis   Data Reviewed: Basic Metabolic Panel:  Recent Labs Lab 01/12/17 1507 01/13/17 0534  NA 137 137  K 3.4* 5.3*  CL 98* 103  CO2 23 23  GLUCOSE 92 152*  BUN 30* 36*  CREATININE 1.90* 1.87*  CALCIUM 9.0 9.0  MG  --  2.3   CBC:  Recent Labs Lab 01/12/17 1507  WBC 14.4*  NEUTROABS 10.7*  HGB 15.6*  HCT 46.0  MCV 90.6  PLT 353   Cardiac Enzymes:  Recent Labs Lab  01/12/17 1507  TROPONINI <0.03   BNP (last 3 results)  Recent Labs  07/29/16 0941 09/05/16 1919 01/12/17 1507  BNP 218.4* 416.5* 193.4*    CBG: No results for input(s): GLUCAP in the last 168 hours.  Recent Results (from the past 240 hour(s))  Respiratory Panel by PCR     Status: None   Collection Time: 01/12/17 10:32 PM  Result Value Ref Range Status   Adenovirus NOT DETECTED NOT DETECTED Final   Coronavirus 229E NOT DETECTED NOT DETECTED Final   Coronavirus HKU1 NOT DETECTED NOT DETECTED Final   Coronavirus NL63 NOT DETECTED NOT DETECTED Final   Coronavirus OC43 NOT DETECTED NOT DETECTED Final   Metapneumovirus NOT DETECTED NOT DETECTED Final   Rhinovirus / Enterovirus NOT DETECTED NOT DETECTED Final   Influenza A NOT DETECTED NOT DETECTED Final   Influenza B NOT DETECTED NOT DETECTED Final   Parainfluenza Virus 1 NOT DETECTED NOT DETECTED Final   Parainfluenza Virus 2 NOT DETECTED NOT DETECTED Final   Parainfluenza Virus 3 NOT DETECTED NOT DETECTED Final   Parainfluenza Virus 4 NOT DETECTED NOT DETECTED Final   Respiratory Syncytial Virus NOT DETECTED NOT DETECTED Final   Bordetella pertussis NOT DETECTED NOT DETECTED Final   Chlamydophila pneumoniae NOT DETECTED NOT DETECTED Final   Mycoplasma pneumoniae NOT DETECTED NOT DETECTED Final    Comment: Performed at [facility], [address] [place], [place] [address]     Studies: Dg Chest 2 View  Result Date: 01/12/2017 CLINICAL DATA:  Shortness of breath EXAM: CHEST  2 VIEW COMPARISON:  12/31/2016 FINDINGS: Lungs are hyperexpanded. Interstitial markings are diffusely coarsened with chronic features. The cardiopericardial silhouette is within normal limits for size. The visualized bony structures of the thorax are intact. Telemetry leads overlie the chest. IMPRESSION: Features suggest emphysema.  No acute cardiopulmonary findings. Electronically Signed   By: Kennith Center M.D.   On: 01/12/2017 15:11   Ct Chest  Wo Contrast  Result Date: 01/12/2017 CLINICAL DATA:  83 year old female with chronic dyspnea. Subsequent encounter. EXAM: CT CHEST WITHOUT CONTRAST TECHNIQUE: Multidetector CT imaging of the chest was performed following the standard protocol without IV contrast. COMPARISON:  01/12/2017 chest x-ray.  07/29/2016 chest CT. FINDINGS: Cardiovascular: Calcified aorta and great vessels. 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 Top-normal heart size. Three vessel coronary artery calcification. Mediastinum/Nodes: Top-normal size precarinal lymph nodes unchanged. Lungs/Pleura: Moderate to marked centrilobular emphysema. No infiltrate or pulmonary edema. Scarring lung apices. Upper Abdomen: No acute abnormality upper abdomen. Musculoskeletal: Scoliosis and superimposed degenerative changes similar to prior exam without focal compression fracture. IMPRESSION: Moderate-to-marked emphysema without change. No acute pulmonary abnormality. Coronary artery calcifications. Aortic Atherosclerosis (ICD10-I70.0).  Emphysema (ICD10-J43.9). Electronically Signed   By: Lacy Duverney M.D.   On: 01/12/2017 18:06    Scheduled Meds: . apixaban  2.5 mg Oral BID  . atorvastatin  5 mg Oral Daily  . cholecalciferol  1,000 Units Oral  BID  . diltiazem  240 mg Oral Daily  . feeding supplement (ENSURE ENLIVE)  237 mL Oral BID BM  . furosemide  20 mg Oral Daily  . latanoprost  1 drop Both Eyes QHS  . [START ON 01/14/2017] predniSONE  30 mg Oral Q breakfast  . umeclidinium bromide  1 puff Inhalation Daily  . vitamin E  1,000 Units Oral BID   Continuous Infusions:  Active Problems:   Dyspnea    Time spent: 30 minutes    Vassie Loll  [facility] Pager [phone] If 7PM-7AM, please contact night-coverage at [email or web address], password [facility] 01/13/2017, 10:43 PM  LOS: 1 day

## 2017-01-13 NOTE — Care Management Obs Status (Signed)
MEDICARE OBSERVATION STATUS NOTIFICATION   Patient Details  Name: [name] MRN: [id number] Date of Birth: Sep 10, 1927   Medicare Observation Status Notification Given:  Yes    MahabirOlegario Messier, RN 01/13/2017, 3:19 PM

## 2017-01-13 NOTE — Consult Note (Signed)
Name: [name] MRN: [id number] DOB: 02-02-1928    ADMISSION DATE:  01/12/2017 CONSULTATION DATE: 10/9  REFERRING MD :  Gwenlyn Perking  CHIEF COMPLAINT:  Acute on chronic hypoxia  BRIEF PATIENT DESCRIPTION: 83 year old pt w/ chronic respiratory failure. Admitted to the hospital after being seen three times for AECOPD and still having significant exertional dyspnea as well as presyncopal event.   SIGNIFICANT EVENTS    STUDIES:  Echocardiogram completed 10/9: EF 60-65%,wall motion was normal, grade 2 diastolic heart dysfunction   HISTORY OF PRESENT ILLNESS:   This is an 83 year old female with a past medical history of chronic respiratory failure in the setting of chronic obstructive pulmonary disease,atrial fib/flutter, diastolic heart dysfunction and prior admission for volume overload. She's recently been seen in our office 3 times for what we've been treating as acute exacerbation of her underlying chronic obstructive lung disease. This initially occurred around September 25 at which time she came in for a sick visit and saw Dr. Craige Cotta. She is reporting subjective fever, and worsening exertional dyspnea. She had some cough. She was treated empirically with doxycycline. Her symptoms did not improve on the 26th her oxygen levels were noted to be in the low 80s with exertion she was instructed to go to the emergency room but refused due to waiting time ultimately we called in a prednisone taper and she was seen as a follow-up with Dr. Jamison Neighbor on the 27th.at that point she seemed to feel a little better, his instructions were to continue her bronchodilator therapy, continue steroid taper, and follow-up by the following week. At the time of her outpatient follow-up she had been weaned off from her prednisone and antibiotic, ut once again was feeling more exertional dyspnea. She was placed once again on a prednisone taper and azithromycin. The following morning on 10/4 she woke with a sensation that  "something just didn't feel right". She continued to have exertional dyspnea, and because of this she went to her primary doctor's. She was seen by her primary doctor who noted her resting pulse oximetry on 2 L was in the low 80s, because of this and the fact that she had had what sounds like presyncopal symptoms she was sent to the hospital for further evaluation. She was admitted to the hospital on 10/8 with a working diagnosis of once again acute on chronic respiratory failure in the setting of exacerbation of her chronic obstructive pulmonary disease. Treatment has included supplemental oxygen, IV systemic steroids, scheduled bronchodilators, and IV antibiotics. She does feel some better, however at this point she has not exerted herself much on ambulating to the bathroom. Pulmonary has been asked to see due to her increased oxygen requirements as well as for concern about failing to respond to outpatient therapy.  Of note she was instructed to titrate her oxygen during her pulmonary office visit for activity. She had only been keeping her oxygen  at 2 L  PAST MEDICAL HISTORY :   has a past medical history of Anticoagulated; Atrial flutter (HCC); Bloody stools; CHF (congestive heart failure) (HCC); Chronic back pain; CKD (chronic kidney disease), stage III (HCC); COPD (chronic obstructive pulmonary disease) (HCC); Diverticulosis; Dyspnea; Dysrhythmia; Edema; Gait disorder; Glaucoma; Glaucoma; High cholesterol; Multinodular goiter; PAOD (peripheral arterial occlusive disease) (HCC); PMR (polymyalgia rheumatica) (HCC); Pneumonia, organism unspecified(486) (04/18/2016); Protein-calorie malnutrition (HCC); Rash of back; and Weakness.  has a past surgical history that includes Gallbladder surgery; leg stent (Right); Laminectomy; Esophagogastroduodenoscopy (N/A, 07/08/2013); Shoulder surgery; Colonoscopy (N/A, 07/15/2013);  and ir generic historical (05/18/2014). Prior to Admission medications   Medication Sig  Start Date End Date Taking? Authorizing Provider  acetaminophen (TYLENOL) 500 MG tablet Take 500-1,000 mg by mouth every 4 (four) hours as needed for mild pain.    Yes [provider]  albuterol (PROAIR HFA) 108 (90 Base) MCG/ACT inhaler Inhale 2 puffs into the lungs every 6 (six) hours as needed for wheezing or shortness of breath. 07/18/16  Yes Coralyn Helling, MD  albuterol (PROVENTIL) (2.5 MG/3ML) 0.083% nebulizer solution Take 3 mLs (2.5 mg total) by nebulization every 6 (six) hours as needed for wheezing or shortness of breath. DX: J44.9 01/07/17  Yes Bevelyn Ngo, NP  atorvastatin (LIPITOR) 10 MG tablet Take 5 mg by mouth daily.    Yes [provider]  bimatoprost (LUMIGAN) 0.01 % SOLN Place 1 drop into both eyes at bedtime.   Yes [provider]  cholecalciferol (VITAMIN D) 1000 UNITS tablet Take 1,000 Units by mouth 2 (two) times daily.    Yes [provider]  diltiazem (CARDIZEM) 120 MG tablet Take 240 mg by mouth daily.    Yes [provider]  ELIQUIS 2.5 MG TABS tablet Take 2.5 mg by mouth 2 (two) times daily. 04/11/16  Yes [provider]  feeding supplement, ENSURE ENLIVE, (ENSURE ENLIVE) LIQD Take 237 mLs by mouth 2 (two) times daily between meals. 08/01/16  Yes Ghimire, Werner Lean, MD  furosemide (LASIX) 20 MG tablet Take 1 tablet (20 mg total) by mouth daily. 09/08/16  Yes Randel Pigg, Dorma Russell, MD  OXYGEN Inhale 2 L into the lungs daily.   Yes [provider]  potassium chloride (K-DUR,KLOR-CON) 10 MEQ tablet Take 10 mEq by mouth 2 (two) times daily.   Yes [provider]  predniSONE (DELTASONE) 10 MG tablet Take 4 tabs for 2 days, then 3 tabs for 2 days, 2 tabs for 2 days, then 1 tab for 2 days, then stop. 01/07/17  Yes Bevelyn Ngo, NP  tetrahydrozoline 0.05 % ophthalmic solution Place 1 drop into both eyes 4 (four) times daily as needed (For dry eyes.).   Yes [provider]  traMADol-acetaminophen (ULTRACET)  37.5-325 MG per tablet Take 1-2 tablets by mouth 2 (two) times daily as needed for moderate pain.  06/01/13  Yes [provider]  umeclidinium bromide (INCRUSE ELLIPTA) 62.5 MCG/INH AEPB Inhale 1 puff into the lungs daily.   Yes [provider]  vitamin E 1000 UNIT capsule Take 1,000 Units by mouth 2 (two) times daily.    Yes [provider]  azithromycin (ZITHROMAX) 250 MG tablet Take 2 pills today then one a day for 4 additional days Patient not taking: Reported on 01/12/2017 01/07/17   Bevelyn Ngo, NP  benzonatate (TESSALON) 100 MG capsule Take 1 capsule (100 mg total) by mouth 3 (three) times daily as needed for cough. 09/08/16   Lenox Ponds, MD   Allergies  Allergen Reactions  . Codeine Sulfate Nausea And Vomiting and Other (See Comments)    headache    FAMILY HISTORY:  family history includes Cirrhosis in her maternal aunt; Diabetes in her maternal uncle; Throat cancer in her maternal grandfather and mother. SOCIAL HISTORY:  reports that she quit smoking about 23 years ago. Her smoking use included Cigarettes. She has never used smokeless tobacco. She reports that she does not drink alcohol or use drugs.  REVIEW OF SYSTEMS:   Constitutional:  She's had increased exertional dyspnea, worsening fatigue, subjective  fever HENT: Negative for hearing loss, ear pain, nosebleeds, congestion, sore throat, neck pain, tinnitus and ear discharge.   Eyes: Negative for blurred vision, double vision, photophobia, pain, discharge and redness.  Respiratory: She's had increased shortness of breath particularly with exertion, she has had some wheezing, she's had no cough, no nasal congestion, no chest pain, no orthopnea   Cardiovascular: Negative for chest pain, palpitations, orthopnea, claudication, leg swelling mildand PND.  Gastrointestinal: Negative for heartburn, nausea, vomiting, abdominal pain, diarrhea, constipation, blood in stool and melena.  Genitourinary:  Negative for dysuria, urgency, frequency, hematuria and flank pain.  Musculoskeletal: Negative for myalgias, back pain, joint pain and falls.  Skin: Negative for itching and rash.  Neurological: Negative for dizziness, tingling, tremors, sensory change, speech change, focal weakness, seizures, loss of consciousness, weakness and headaches.  Endo/Heme/Allergies: Negative for environmental allergies and polydipsia. Does not bruise/bleed easily.  SUBJECTIVE:  Feels better VITAL SIGNS: Temp:  [97.5 F (36.4 C)-98.1 F (36.7 C)] 97.5 F (36.4 C) (10/09 1426) Pulse Rate:  [74-96] 74 (10/09 1426) Resp:  [18-25] 18 (10/09 1426) BP: (103-140)/(58-81) 140/76 (10/09 1426) SpO2:  [89 %-97 %] 94 % (10/09 1426) Weight:  [110 lb 4.8 oz (50 kg)] 110 lb 4.8 oz (50 kg) (10/08 2014)  PHYSICAL EXAMINATION: General:  Frail 83 year old white female currently resting in bed in no acute distress Neuro:  Awake alert oriented no focal deficits she is hard of hearing HEENT:  Normocephalic/atraumatic mucous membranes are moist no rash no JVD Cardiovascular:  Regular irregular no murmur Lungs:  Basilar crackles but no rales no accessory muscle use currently at rest Abdomen:  Soft not tender no organomegaly Musculoskeletal:  Equal strength and bulk Skin:  Warm dry with only trace lower extremity edema   Recent Labs Lab 01/12/17 1507 01/13/17 0534  NA 137 137  K 3.4* 5.3*  CL 98* 103  CO2 23 23  BUN 30* 36*  CREATININE 1.90* 1.87*  GLUCOSE 92 152*    Recent Labs Lab 01/12/17 1507  HGB 15.6*  HCT 46.0  WBC 14.4*  PLT 353   Dg Chest 2 View  Result Date: 01/12/2017 CLINICAL DATA:  Shortness of breath EXAM: CHEST  2 VIEW COMPARISON:  12/31/2016 FINDINGS: Lungs are hyperexpanded. Interstitial markings are diffusely coarsened with chronic features. The cardiopericardial silhouette is within normal limits for size. The visualized bony structures of the thorax are intact. Telemetry leads overlie the  chest. IMPRESSION: Features suggest emphysema.  No acute cardiopulmonary findings. Electronically Signed   By: Kennith Center M.D.   On: 01/12/2017 15:11   Ct Chest Wo Contrast  Result Date: 01/12/2017 CLINICAL DATA:  83 year old female with chronic dyspnea. Subsequent encounter. EXAM: CT CHEST WITHOUT CONTRAST TECHNIQUE: Multidetector CT imaging of the chest was performed following the standard protocol without IV contrast. COMPARISON:  01/12/2017 chest x-ray.  07/29/2016 chest CT. FINDINGS: Cardiovascular: Calcified aorta and great vessels. 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 Top-normal heart size. Three vessel coronary artery calcification. Mediastinum/Nodes: Top-normal size precarinal lymph nodes unchanged. Lungs/Pleura: Moderate to marked centrilobular emphysema. No infiltrate or pulmonary edema. Scarring lung apices. Upper Abdomen: No acute abnormality upper abdomen. Musculoskeletal: Scoliosis and superimposed degenerative changes similar to prior exam without focal compression fracture. IMPRESSION: Moderate-to-marked emphysema without change. No acute pulmonary abnormality. Coronary artery calcifications. Aortic Atherosclerosis (ICD10-I70.0).  Emphysema (ICD10-J43.9). Electronically Signed   By: Lacy Duverney M.D.   On: 01/12/2017 18:06    ASSESSMENT / PLAN:  Chronic hypoxic respiratory failure  presyncope Chronic obstructive pulmonary disease Possible AE COPD Acute diastolic heart failure Grade 2 diastolic heart failure History of cor pulmonale History of atrial fib  Discussion No real compelling evidence that this is an acute exacerbation of her underlying COPD at this point. Given her history certainly would wonder about cardiac arrhythmia or her underlying diastolic dysfunction as the etiology of her presyncopal event. Certainly this all could just be due to insufficient oxygen delivery (in the setting of new pulmonary baseline). She has a history of  noncompliance with oxygen prior, she is now using it however not titrating it and clearly desaturates with activity. A CT of the chest was obtained this did not show any findings that would explain this there is no pulmonary emboli, no pneumonia, there are some chronic bronchiectatic changes as well as apical scarring however this would not necessarily explain her symptoms. The most likely explanation would simply be she needs to wear more oxygen, and that desaturation episodes have resulted in the presyncopal symptoms. I think untreated infection less likely. Adrenal insufficiency a possible explanation given abrupt cessation of steroids  Plan/Recommendations Slow prednisone taper, we'll change her to 30 mg daily, and slowly taper this down by 10 mg every 5 days She needs walking oximetry, have discussed the importance of titrating her oxygen to achieve saturations above 88% Continue scheduled bronchodilators Diuresis as before and and creatinine will tolerate Agree with cardiology evaluation Check BNP Repeat am chemistry   Again I'm not convinced that there is anything acute here. It is possible Mrs. Yankey simply has a new baseline and needs to titrate her oxygen appropriately  Simonne Martinet ACNP-BC [facility] Pulmonary/Critical Care Pager # [phone] OR # [phone] if no answer   01/13/2017, 4:21 PM

## 2017-01-13 NOTE — Progress Notes (Signed)
  Echocardiogram 2D Echocardiogram has been performed.  Kealy Lewter, Tony 01/13/2017, 1:19 PM 

## 2017-01-13 NOTE — Evaluation (Signed)
Physical Therapy Evaluation Patient Details Name: [name] MRN: [id number] DOB: Mar 02, 1928 Today's Date: 01/13/2017   History of Present Illness  Pt is an 83 year old female h/o afib/flutter, copd, diastolic chf, chronic hypoxic respiratory failure on home o2 2liters baseline and admitted for acute on chronic respiratory failure  Clinical Impression  Pt admitted with above diagnosis. Pt currently with functional limitations due to the deficits listed below (see PT Problem List).  Pt will benefit from skilled PT to increase their independence and safety with mobility to allow discharge to the venue listed below.  Pt mildly unsteady and utilized hand rail in hallway during ambulation.  Pt with SPO2 95% on 3L O2 [place] during ambulation (typically remains on 2L O2 at baseline).  Pt plans to return to ILF with spouse and agreeable to HHPT.     Follow Up Recommendations Home health PT;Supervision/Assistance - 24 hour    Equipment Recommendations  None recommended by PT    Recommendations for Other Services       Precautions / Restrictions Precautions Precautions: Fall Precaution Comments: on 2L O2 baseline      Mobility  Bed Mobility Overal bed mobility: Modified Independent                Transfers Overall transfer level: Needs assistance Equipment used: None Transfers: Sit to/from Stand Sit to Stand: Min assist         General transfer comment: slight assist to steady with rise  Ambulation/Gait Ambulation/Gait assistance: Min guard Ambulation Distance (Feet): 30 Feet Assistive device: None Gait Pattern/deviations: Step-through pattern;Decreased stride length     General Gait Details: verbal cues for pursed lip breathing, pt preferred to use hand rail for steadying, only able to tolerate 30 feet due to moderate dyspnea, ambulated on 3L O2 [place] (pt reports she increases liters if SOB at home), SpO2 95% on 3L O2 upon returning to room  Stairs             Wheelchair Mobility    Modified Rankin (Stroke Patients Only)       Balance                                             Pertinent Vitals/Pain Pain Assessment: No/denies pain    Home Living   Living Arrangements: Spouse/significant other   Type of Home: Independent living facility Home Access: Level entry     Home Layout: One level Home Equipment: Walker - 2 wheels      Prior Function Level of Independence: Independent         Comments: typically able to ambulate 10-15 minutes to dining hall     Hand Dominance        Extremity/Trunk Assessment        Lower Extremity Assessment Lower Extremity Assessment: Generalized weakness    Cervical / Trunk Assessment Cervical / Trunk Assessment: Normal  Communication   Communication: HOH  Cognition Arousal/Alertness: Awake/alert Behavior During Therapy: WFL for tasks assessed/performed Overall Cognitive Status: Within Functional Limits for tasks assessed                                        General Comments      Exercises     Assessment/Plan    PT Assessment  Patient needs continued PT services  PT Problem List Decreased strength;Decreased mobility;Decreased knowledge of use of DME;Decreased activity tolerance;Cardiopulmonary status limiting activity       PT Treatment Interventions DME instruction;Gait training;Therapeutic exercise;Therapeutic activities;Patient/family education;Functional mobility training    PT Goals (Current goals can be found in the Care Plan section)  Acute Rehab PT Goals PT Goal Formulation: With patient Time For Goal Achievement: 01/20/17 Potential to Achieve Goals: Good Additional Goals Additional Goal #1: Perform ambulation with SPO2 above 92%    Frequency     Barriers to discharge        Co-evaluation               AM-PAC PT "6 Clicks" Daily Activity  Outcome Measure Difficulty turning over in bed (including  adjusting bedclothes, sheets and blankets)?: None Difficulty moving from lying on back to sitting on the side of the bed? : None Difficulty sitting down on and standing up from a chair with arms (e.g., wheelchair, bedside commode, etc,.)?: Unable Help needed moving to and from a bed to chair (including a wheelchair)?: A Little Help needed walking in hospital room?: A Lot Help needed climbing 3-5 steps with a railing? : Total 6 Click Score: 15    End of Session Equipment Utilized During Treatment: Gait belt;Oxygen Activity Tolerance: Patient limited by fatigue Patient left: in chair;with call bell/phone within reach;with chair alarm set;with family/visitor present   PT Visit Diagnosis: Difficulty in walking, not elsewhere classified (R26.2)    Time: 4540-9811 PT Time Calculation (min) (ACUTE ONLY): 14 min   Charges:   PT Evaluation $PT Eval Low Complexity: 1 Low     PT G Codes:   PT G-Codes **NOT FOR INPATIENT CLASS** Functional Assessment Tool Used: AM-PAC 6 Clicks Basic Mobility;Clinical judgement Functional Limitation: Mobility: Walking and moving around Mobility: Walking and Moving Around Current Status (B1478): At least 20 percent but less than 40 percent impaired, limited or restricted Mobility: Walking and Moving Around Goal Status [phone]): At least 1 percent but less than 20 percent impaired, limited or restricted    Zenovia Jarred, PT, DPT 01/13/2017 Pager: [phone]  Maida Sale E 01/13/2017, 3:20 PM

## 2017-01-14 ENCOUNTER — Ambulatory Visit: Payer: Medicare Other | Admitting: Pulmonary Disease

## 2017-01-14 DIAGNOSIS — I480 Paroxysmal atrial fibrillation: Secondary | ICD-10-CM | POA: Diagnosis not present

## 2017-01-14 DIAGNOSIS — R55 Syncope and collapse: Secondary | ICD-10-CM | POA: Diagnosis not present

## 2017-01-14 DIAGNOSIS — R42 Dizziness and giddiness: Secondary | ICD-10-CM | POA: Diagnosis not present

## 2017-01-14 DIAGNOSIS — I10 Essential (primary) hypertension: Secondary | ICD-10-CM

## 2017-01-14 DIAGNOSIS — J9621 Acute and chronic respiratory failure with hypoxia: Secondary | ICD-10-CM

## 2017-01-14 DIAGNOSIS — R0609 Other forms of dyspnea: Secondary | ICD-10-CM | POA: Diagnosis not present

## 2017-01-14 DIAGNOSIS — J441 Chronic obstructive pulmonary disease with (acute) exacerbation: Principal | ICD-10-CM

## 2017-01-14 LAB — BASIC METABOLIC PANEL
ANION GAP: 14 (ref 5–15)
BUN: 52 mg/dL — AB (ref 6–20)
CALCIUM: 9.4 mg/dL (ref 8.9–10.3)
CO2: 25 mmol/L (ref 22–32)
Chloride: 100 mmol/L — ABNORMAL LOW (ref 101–111)
Creatinine, Ser: 1.89 mg/dL — ABNORMAL HIGH (ref 0.44–1.00)
GFR calc Af Amer: 26 mL/min — ABNORMAL LOW (ref >60)
GFR, EST NON AFRICAN AMERICAN: 23 mL/min — AB (ref >60)
Glucose, Bld: 150 mg/dL — ABNORMAL HIGH (ref 65–99)
POTASSIUM: 3.8 mmol/L (ref 3.5–5.1)
SODIUM: 139 mmol/L (ref 135–145)

## 2017-01-14 MED ORDER — DILTIAZEM HCL ER COATED BEADS 180 MG PO CP24: 360.0000 mg | ORAL_CAPSULE | Freq: Every day | ORAL | Status: DC

## 2017-01-14 MED ORDER — PREDNISONE 10 MG PO TABS: ORAL_TABLET | ORAL | 0 refills | Status: DC

## 2017-01-14 MED ORDER — DILTIAZEM HCL ER COATED BEADS 120 MG PO CP24
120.0000 mg | ORAL_CAPSULE | Freq: Once | ORAL | Status: AC
  Administered 2017-01-14: 120 mg via ORAL
  Filled 2017-01-14: qty 1

## 2017-01-14 MED ORDER — DILTIAZEM HCL ER COATED BEADS 360 MG PO CP24: 360.0000 mg | ORAL_CAPSULE | Freq: Every day | ORAL | 0 refills | Status: DC

## 2017-01-14 MED ORDER — DILTIAZEM HCL ER 60 MG PO CP12: 120.0000 mg | ORAL_CAPSULE | Freq: Once | ORAL | Status: DC

## 2017-01-14 NOTE — Progress Notes (Signed)
SATURATION QUALIFICATIONS: (This note is used to comply with regulatory documentation for home oxygen)  Patient Saturations on Room Air at Rest = 97%  Patient Saturations on Room Air while Ambulating = 86%  Patient Saturations on 2 Liters of oxygen while Ambulating = 95%  Please briefly explain why patient needs home oxygen: Alternatives methods attempted but unsuccessful   Rockne Coons, RN 01/14/17 1:05 PM

## 2017-01-14 NOTE — Consult Note (Signed)
Cardiology Consultation:   Patient ID: JYRAH BLYE; [id number]; [birth year]   Admit date: 01/12/2017 Date of Consult: 01/14/2017  Primary Care Provider: Coralyn Helling, MD Primary Cardiologist: Dr. [place]   Patient Profile:   [name] is a 83 y.o. female with a hx of paroxsymal atrial fibrillation/flutter, chronic anticoagulation with Eliquis, chronic diastolic HF, PAD (50% carotid disease), CKD, and COPD on home O2, who presented with acute on chronic hypoxic respiratory failure in the setting of acute COPDE. Pt also complained of near syncope, for which cardiology has been consulted, at the request of Dr. Blake Divine, Internal Medicine.  History of Present Illness:   Pt has no prior documented h/o obstructive CAD. Most recent echocardiogram, prior to this admission, was 07/30/16, which showed normal LVEF at 55-60% and normal wall motion w/o abnormalities. No significant valvular abnormalities. Normal RV size with mildly decreased systolic function. Last carotid dopplers were performed 05/18/2014 and showed bilateral ICA disease, but not hemodynamically significant. Degree of narrowing was < 50% bilaterally.   Pt presented to the ED on 01/12/17 from PCP office for increased dyspnea and hypoxia with O2 sats in the 80s while on 2 L [place]. Pt had failed outpatient management for acute COPDE. Pt was being treated with antibiotics and was also placed on steroids. Pt was given solumedrol 125mg  x1 and albuterol in the ED. CXR negative for PNA and edema. BNP was slightly abnormal at 193.4. Chest CT showed moderate to marked emphysema w/o change. No acute pulmonary abnormalities. She was noted however to have 3V coronary artery calcifications and aortic atherosclerosis. EKG showed atrial fibrillation with Vrates in the low 100s. K mildly low at 3.4. SCr was 1.90, which is c/w her baseline. Internal Medicine has admitted for acute COPDE.  Pt also mentioned experiencing an episode of near syncope 3 days prior  to admission.  It occurred last Friday morning while trying to get out of bed. ~7-7:30 am. She sat up on the side of the bed after waking up and suddenly felt unwell. She notes that she felt a bit weak and dizzy. No palpitations, CP or dyspnea. She felt that if she were to continue moving, she was going to pass out. She stayed on the edge of the bed and called her husband to help her to a chair. There was no LOC. Symptoms resolved after 5-10 min. She denies any further recurrence.   Thus far, Initial troponin was negative. 2D echo was obtained yesterday showing normal LVEF at 60-65% with normal wall motion. G2DD noted. Mild MR and mild TR noted. Aortic valve normal. RV normal in size w/ normal systolic function. No pericardial effusion. As noted above, chest CT showed 3V coronary artery calcifications. She was noted to be in afib in ED, however she is back in NSR on telemetry. Other than PAF, no other arrhthymias noted on tele.   Past Medical History:  Diagnosis Date  . Anticoagulated    Eliquis  . Atrial flutter (HCC)    NO CARDIOLOGIST FOLLOWED BY DR Tori Milks  . Bloody stools    LAST 4 DAYS  . CHF (congestive heart failure) (HCC)   . Chronic back pain   . CKD (chronic kidney disease), stage III (HCC)    Creatinine 1.35 -1.55  . COPD (chronic obstructive pulmonary disease) (HCC)   . Diverticulosis   . Dyspnea   . Dysrhythmia   . Edema   . Gait disorder   . Glaucoma    Severe  . Glaucoma  both eyes  . High cholesterol   . Multinodular goiter   . PAOD (peripheral arterial occlusive disease) (HCC)    Stein in right groin, decrease pulses  . PMR (polymyalgia rheumatica) (HCC)   . Pneumonia, organism unspecified(486) 04/18/2016  . Protein-calorie malnutrition (HCC)   . Rash of back   . Weakness     Past Surgical History:  Procedure Laterality Date  . COLONOSCOPY N/A 07/15/2013   Procedure: COLONOSCOPY;  Surgeon: Louis Meckel, MD;  Location: [facility]COPY;  Service:  Endoscopy;  Laterality: N/A;  . ESOPHAGOGASTRODUODENOSCOPY N/A 07/08/2013   Procedure: ESOPHAGOGASTRODUODENOSCOPY (EGD);  Surgeon: Louis Meckel, MD;  Location: [facility];  Service: Endoscopy;  Laterality: N/A;  Per Connye Burkitt, will try to have CRNA available, but will not guaranteee. Ok to schedule per Clydie Braun 07/06/13  . GALLBLADDER SURGERY    . IR GENERIC HISTORICAL  05/18/2014   IR RADIOLOGIST EVAL & MGMT 05/18/2014 Berdine Dance, MD [facility] INTERV RAD  . LAMINECTOMY    . leg stent Right   . SHOULDER SURGERY     frozen shoulder/ Bil shoulders     Home Medications:  Prior to Admission medications   Medication Sig Start Date End Date Taking? Authorizing Provider  acetaminophen (TYLENOL) 500 MG tablet Take 500-1,000 mg by mouth every 4 (four) hours as needed for mild pain.    Yes [provider]  albuterol (PROAIR HFA) 108 (90 Base) MCG/ACT inhaler Inhale 2 puffs into the lungs every 6 (six) hours as needed for wheezing or shortness of breath. 07/18/16  Yes Coralyn Helling, MD  albuterol (PROVENTIL) (2.5 MG/3ML) 0.083% nebulizer solution Take 3 mLs (2.5 mg total) by nebulization every 6 (six) hours as needed for wheezing or shortness of breath. DX: J44.9 01/07/17  Yes Bevelyn Ngo, NP  atorvastatin (LIPITOR) 10 MG tablet Take 5 mg by mouth daily.    Yes [provider]  bimatoprost (LUMIGAN) 0.01 % SOLN Place 1 drop into both eyes at bedtime.   Yes [provider]  cholecalciferol (VITAMIN D) 1000 UNITS tablet Take 1,000 Units by mouth 2 (two) times daily.    Yes [provider]  diltiazem (CARDIZEM) 120 MG tablet Take 240 mg by mouth daily.    Yes [provider]  ELIQUIS 2.5 MG TABS tablet Take 2.5 mg by mouth 2 (two) times daily. 04/11/16  Yes [provider]  feeding supplement, ENSURE ENLIVE, (ENSURE ENLIVE) LIQD Take 237 mLs by mouth 2 (two) times daily between meals. 08/01/16  Yes Ghimire, Werner Lean, MD  furosemide (LASIX) 20 MG tablet  Take 1 tablet (20 mg total) by mouth daily. 09/08/16  Yes Randel Pigg, Dorma Russell, MD  OXYGEN Inhale 2 L into the lungs daily.   Yes [provider]  potassium chloride (K-DUR,KLOR-CON) 10 MEQ tablet Take 10 mEq by mouth 2 (two) times daily.   Yes [provider]  predniSONE (DELTASONE) 10 MG tablet Take 4 tabs for 2 days, then 3 tabs for 2 days, 2 tabs for 2 days, then 1 tab for 2 days, then stop. 01/07/17  Yes Bevelyn Ngo, NP  tetrahydrozoline 0.05 % ophthalmic solution Place 1 drop into both eyes 4 (four) times daily as needed (For dry eyes.).   Yes [provider]  traMADol-acetaminophen (ULTRACET) 37.5-325 MG per tablet Take 1-2 tablets by mouth 2 (two) times daily as needed for moderate pain.  06/01/13  Yes [provider]  umeclidinium bromide (INCRUSE ELLIPTA) 62.5 MCG/INH AEPB Inhale 1  puff into the lungs daily.   Yes [provider]  vitamin E 1000 UNIT capsule Take 1,000 Units by mouth 2 (two) times daily.    Yes [provider]  azithromycin (ZITHROMAX) 250 MG tablet Take 2 pills today then one a day for 4 additional days Patient not taking: Reported on 01/12/2017 01/07/17   Bevelyn Ngo, NP  benzonatate (TESSALON) 100 MG capsule Take 1 capsule (100 mg total) by mouth 3 (three) times daily as needed for cough. 09/08/16   Lenox Ponds, MD    Inpatient Medications: Scheduled Meds: . apixaban  2.5 mg Oral BID  . atorvastatin  5 mg Oral Daily  . cholecalciferol  1,000 Units Oral BID  . diltiazem  240 mg Oral Daily  . feeding supplement (ENSURE ENLIVE)  237 mL Oral BID BM  . furosemide  20 mg Oral Daily  . latanoprost  1 drop Both Eyes QHS  . predniSONE  30 mg Oral Q breakfast  . umeclidinium bromide  1 puff Inhalation Daily  . vitamin E  1,000 Units Oral BID   Continuous Infusions:  PRN Meds: acetaminophen, albuterol, benzonatate, naphazoline-glycerin, traMADol-acetaminophen  Allergies:    Allergies  Allergen Reactions  .  Codeine Sulfate Nausea And Vomiting and Other (See Comments)    headache    Social History:   Social History   Social History  . Marital status: Married    Spouse name: N/A  . Number of children: 4  . Years of education: N/A   Occupational History  . Retired    Social History Main Topics  . Smoking status: Former Smoker    Types: Cigarettes    Quit date: 07/12/1993  . Smokeless tobacco: Never Used  . Alcohol use No  . Drug use: No  . Sexual activity: Not on file   Other Topics Concern  . Not on file   Social History Narrative   Married -Dr. Aura Fey   Former smoker - stopped 1995   Alcohol none   POA, Living Will    Family History:    Family History  Problem Relation Age of Onset  . Throat cancer Mother   . Diabetes Maternal Uncle   . Throat cancer Maternal Grandfather   . Cirrhosis Maternal Aunt      ROS:  Please see the history of present illness.  ROS  All other ROS reviewed and negative.     Physical Exam/Data:   Vitals:   01/13/17 0909 01/13/17 1426 01/13/17 2010 01/14/17 0600  BP: (!) 140/58 140/76 (!) 142/67 (!) 153/67  Pulse:  74 68 67  Resp:  18 18 18   Temp:  (!) 97.5 F (36.4 C) 98.4 F (36.9 C) 98.6 F (37 C)  TempSrc:  Oral Oral Oral  SpO2:  94% 97% 100%  Weight:      Height:        Intake/Output Summary (Last 24 hours) at 01/14/17 0916 Last data filed at 01/14/17 0600  Gross per 24 hour  Intake             1010 ml  Output             1600 ml  Net             -590 ml   Filed Weights   01/12/17 2014  Weight: 110 lb 4.8 oz (50 kg)   Body mass index is 19.54 kg/m.  General:  Well nourished, well developed, in no acute distress  HEENT: normal Lymph: no adenopathy Neck: no JVD Endocrine:  No thryomegaly Vascular: No carotid bruits; FA pulses 2+ bilaterally without bruits  Cardiac:  normal S1, S2; RRR; no murmur  Lungs:  clear to auscultation bilaterally, no wheezing, rhonchi or rales  Abd: soft, nontender, no  hepatomegaly  Ext: no edema Musculoskeletal:  No deformities, BUE and BLE strength normal and equal Skin: warm and dry  Neuro:  CNs 2-12 intact, no focal abnormalities noted Psych:  Normal affect   EKG:  The EKG was personally reviewed and demonstrates:  Atrial fibrillation 101 bpm Telemetry:  Telemetry was personally reviewed and demonstrates:  NSR currently  Relevant CV Studies: Bilateral Carotid Dopplers 05/2014  RIGHT CAROTID ARTERY: Minor echogenic shadowing plaque formation. No hemodynamically significant right ICA stenosis, velocity elevation, or turbulent flow. Degree of narrowing less than 50%.  RIGHT VERTEBRAL ARTERY:  Antegrade  LEFT CAROTID ARTERY: Similar scattered minor echogenic plaque formation. No hemodynamically significant left ICA stenosis, velocity elevation, or turbulent flow.  LEFT VERTEBRAL ARTERY:  Retrograde  IMPRESSION: Minor carotid atherosclerosis. No hemodynamically significant ICA stenosis. Degree of narrowing less than 50% bilaterally.  Patent antegrade right vertebral flow.  Retrograde left vertebral flow evident. This is compatible with left subclavian steal syndrome.  2D Echo 01/13/17 Study Conclusions  - Left ventricle: The cavity size was normal. Systolic function was   normal. The estimated ejection fraction was in the range of 60%   to 65%. Wall motion was normal; there were no regional wall   motion abnormalities. Features are consistent with a pseudonormal   left ventricular filling pattern, with concomitant abnormal   relaxation and increased filling pressure (grade 2 diastolic   dysfunction). - Aortic valve: Trileaflet; mildly thickened, mildly calcified   leaflets. - Mitral valve: There was mild regurgitation. - Tricuspid valve: There was mild regurgitation. - Pulmonic valve: Cusp separation was normal. - Pulmonary arteries: Systolic pressure could not be accurately   estimated.   Laboratory  Data:  Chemistry  Recent Labs Lab 01/12/17 1507 01/13/17 0534 01/14/17 0516  NA 137 137 139  K 3.4* 5.3* 3.8  CL 98* 103 100*  CO2 GLUCOSE 92 152* 150*  BUN 30* 36* 52*  CREATININE 1.90* 1.87* 1.89*  CALCIUM 9.0 9.0 9.4  GFRNONAA 23* 23* 23*  GFRAA 26* 27* 26*  ANIONGAP 16* 11 14    No results for input(s): PROT, ALBUMIN, AST, ALT, ALKPHOS, BILITOT in the last 168 hours. Hematology  Recent Labs Lab 01/12/17 1507  WBC 14.4*  RBC 5.08  HGB 15.6*  HCT 46.0  MCV 90.6  MCH 30.7  MCHC 33.9  RDW 13.7  PLT 353   Cardiac Enzymes  Recent Labs Lab 01/12/17 1507  TROPONINI <0.03   No results for input(s): TROPIPOC in the last 168 hours.  BNP  Recent Labs Lab 01/12/17 1507  BNP 193.4*    DDimer No results for input(s): DDIMER in the last 168 hours.  Radiology/Studies:  Dg Chest 2 View  Result Date: 01/12/2017 CLINICAL DATA:  Shortness of breath EXAM: CHEST  2 VIEW COMPARISON:  12/31/2016 FINDINGS: Lungs are hyperexpanded. Interstitial markings are diffusely coarsened with chronic features. The cardiopericardial silhouette is within normal limits for size. The visualized bony structures of the thorax are intact. Telemetry leads overlie the chest. IMPRESSION: Features suggest emphysema.  No acute cardiopulmonary findings. Electronically Signed   By: Kennith Center M.D.   On: 01/12/2017 15:11   Ct Chest Wo Contrast  Result Date:  01/12/2017 CLINICAL DATA:  83 year old female with chronic dyspnea. Subsequent encounter. EXAM: CT CHEST WITHOUT CONTRAST TECHNIQUE: Multidetector CT imaging of the chest was performed following the standard protocol without IV contrast. COMPARISON:  01/12/2017 chest x-ray.  07/29/2016 chest CT. FINDINGS: Cardiovascular: Calcified aorta and great vessels. 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 Top-normal heart size. Three vessel coronary artery calcification. Mediastinum/Nodes: Top-normal size precarinal  lymph nodes unchanged. Lungs/Pleura: Moderate to marked centrilobular emphysema. No infiltrate or pulmonary edema. Scarring lung apices. Upper Abdomen: No acute abnormality upper abdomen. Musculoskeletal: Scoliosis and superimposed degenerative changes similar to prior exam without focal compression fracture. IMPRESSION: Moderate-to-marked emphysema without change. No acute pulmonary abnormality. Coronary artery calcifications. Aortic Atherosclerosis (ICD10-I70.0).  Emphysema (ICD10-J43.9). Electronically Signed   By: Lacy Duverney M.D.   On: 01/12/2017 18:06    Assessment and Plan:   1.  Near Syncope: occurred after waking and attempting to get out of bed. No LOC, chest pain, dyspnea nor palpations associated with event. Symptoms lasted 5-10 min and resolved. No recurrence. Admit EKG showed afib, but pt now back in NSR. No other arrhthymias noted on tele. 2D echo with normal wall motion, LVEF and no significant valvular dysfunction. No pericardial effusion. Troponin negative. She has h/o carotid artery disease with last doppler study in 2016 showing <50% disease bilaterally. No bruits heard on exam today. May consider repeat outpatient dopplers and possible event monitor. F/u with Dr. [place]. Symptoms may have been caused by sitting up too quickly.   2.  Acute Hypoxic Respiratory Failure 2/2 Acute COPDE: management per IM. On antibiotics and steroids.   3. Chronic Diastolic HF: normal LVEF. G2DD noted on echo. Monitor volume status given treatment with prednisone for COPDE. Currently stable.   4. Coronary Artery Calcifications: noted on chest CT. 3 vessel. However pt denies any exertional CP. Would continue statin therapy. No BB given COPD. No ASA given Eliquis. She has f/u with Dr. Sw[place]il[place] soon. He can determine need for further w/u.   5. Paroxysmal Atrial Fibrillation/Flutter: has been rate controlled. Per previous office notes, not a good candidate for AADs given her significant pulmonary disease.  She is on Eliquis for [facility]. PRN albuterol ordered for COPD. May consider switch to Xopenex to reduce risk for reflex tachycardia. Not currently on a BB given COPD. Continue Cardizem for rate control. Keep K stable.    6. Carotid Artery Disease: last carotid dopplers in 2016 showed < 50% ICA disease bilaterally. Given near syncope, we can arrange for f/u carotid dopplers as an outpatient.   7. CKD: SCr has been 1.8-1.9 this admit, c/w baseline. Monitor.   MD to follow with further recommendations.    For questions or updates, please contact [facility] Please consult [email or web address] for contact info under Cardiology/STEMI.   Signed, Robbie Lis, PA-C  01/14/2017 9:16 AM   The patient was seen, examined and discussed with Brittainy M. Sharol Harness, PA-C and I agree with the above.   A very pleasant 83 year old female with prior medical history of paroxsymal atrial fibrillation/flutter, chronic anticoagulation with Eliquis, chronic diastolic HF, PAD (50% carotid disease), CKD, and COPD on home O2, who presented with acute on chronic hypoxic respiratory failure in the setting of acute COPD. Pt also complained of near syncope, for which cardiology has been consulted, she was found in to be in atrial fibrillation on admission however cardioverted to sinus rhythm shortly afterwards. It is unclear if her syncope is secondary to severe hypoxia with acute on chronic COPD  exacerbation or because of atrial fibrillation with RVR. The maturing hospital shows very short episodes of foot appears atrial tachycardia with ventricular rate of 1 17 bpm that quickly resolves and go back to sinus rhythm with heart rate in 80s. She currently feels better with her shortness of breath denies any chest pain no dizziness, she has just started to walk with improved shortness of breath compared to admission. She is anticoagulated with Eliquis. She is hypertensive and I will increase her Cardizem to 360 mg daily. Her  hypertension and tachycardia may be result of intense respiratory treatments and steroids. We might need to adjust Cardizem back to 240 once she is finished with her treatment. She otherwise appears euvolemic to dry.  Tobias Alexander, MD 01/14/2017

## 2017-01-14 NOTE — Discharge Summary (Addendum)
Physician Discharge Summary  [name]:[id number] DOB: Oct 16, 1927 DOA: 01/12/2017  PCP: Coralyn Helling, MD  Admit date: 01/12/2017 Discharge date: 01/14/2017  Admitted From: Home.  Disposition:  Home.   Recommendations for Outpatient Follow-up:  1. Follow up with PCP in 1-2 weeks 2. Please obtain BMP/CBC in one week Please follow up cardiology as recommended next  Month.  Please follow up with pulmonology in 1 to 2 weeks.   Discharge Condition: stable.  CODE STATUS:full code.  Diet recommendation: Heart Healthy  Brief/Interim Summary:  83 year old lady with h/o afib/flutter, copd, diastolic chf, chronic hypoxic respiratory failure on home oxygen at  2liters,was admitted for hypoxia.Marland Kitchen She was evaluated for acute on chronic respiratory failure with hypoxia.    Discharge Diagnoses:  Active Problems:   Dyspnea   Acute on chronic respiratory failure (HCC)   Postural dizziness with presyncope   Essential hypertension  Acute on chronic resp failure -most likely secondary to COPD -partially treated exacerbation and new progression baseline for her disease. -will continue oxygen supplementation (aiming for sat goal of 88-89%) - taper steroids as per pulmonology recommendations.  -will continue lasix PO -no signs of fluid overload on exam and no vascular congestion on CXR/CT -continue home inhaler therapy  Near syncope event -continue monitoring on telemetry -so far work up neg -cardiology consulted, recommend outpatient event monitor placement.    Chronic atrial fibrillation -will continue cardizem, cardizem dose changed by cardiology . and Eliquis -CHADsVASC score 4  Hypokalemia -repleted   Physical deconditioning  Home health on discharge.   HLD -continue statins.  Stage 4 CKD: Creatinine at baseline.    Discharge Instructions  Discharge Instructions    (HEART FAILURE PATIENTS) Call MD:  Anytime you have any of the following symptoms: 1) 3 pound  weight gain in 24 hours or 5 pounds in 1 week 2) shortness of breath, with or without a dry hacking cough 3) swelling in the hands, feet or stomach 4) if you have to sleep on extra pillows at night in order to breathe.    Complete by:  As directed    Diet - low sodium heart healthy    Complete by:  As directed    Discharge instructions    Complete by:  As directed    Follow up with PCP in one week.  Please follow up with cardiology as scheduled.  Please follow up with pulmonologist in 2 weeks before the steroids are completed.     Allergies as of 01/14/2017      Reactions   Codeine Sulfate Nausea And Vomiting, Other (See Comments)   headache      Medication List    STOP taking these medications   azithromycin 250 MG tablet Commonly known as:  ZITHROMAX   diltiazem 120 MG tablet Commonly known as:  CARDIZEM     TAKE these medications   albuterol 108 (90 Base) MCG/ACT inhaler Commonly known as:  PROAIR HFA Inhale 2 puffs into the lungs every 6 (six) hours as needed for wheezing or shortness of breath.   albuterol (2.5 MG/3ML) 0.083% nebulizer solution Commonly known as:  PROVENTIL Take 3 mLs (2.5 mg total) by nebulization every 6 (six) hours as needed for wheezing or shortness of breath. DX: J44.9   atorvastatin 10 MG tablet Commonly known as:  LIPITOR Take 5 mg by mouth daily.   benzonatate 100 MG capsule Commonly known as:  TESSALON Take 1 capsule (100 mg total) by mouth 3 (three) times daily  as needed for cough.   bimatoprost 0.01 % Soln Commonly known as:  LUMIGAN Place 1 drop into both eyes at bedtime.   cholecalciferol 1000 units tablet Commonly known as:  VITAMIN D Take 1,000 Units by mouth 2 (two) times daily.   diltiazem 360 MG 24 hr capsule Commonly known as:  CARDIZEM CD Take 1 capsule (360 mg total) by mouth daily.   ELIQUIS 2.5 MG Tabs tablet Generic drug:  apixaban Take 2.5 mg by mouth 2 (two) times daily.   feeding supplement (ENSURE ENLIVE)  Liqd Take 237 mLs by mouth 2 (two) times daily between meals.   furosemide 20 MG tablet Commonly known as:  LASIX Take 1 tablet (20 mg total) by mouth daily.   INCRUSE ELLIPTA 62.5 MCG/INH Aepb Generic drug:  umeclidinium bromide Inhale 1 puff into the lungs daily.   OXYGEN Inhale 2 L into the lungs daily.   potassium chloride 10 MEQ tablet Commonly known as:  K-DUR,KLOR-CON Take 10 mEq by mouth 2 (two) times daily.   predniSONE 10 MG tablet Commonly known as:  DELTASONE Prednisone 30 mg daily fro 4 days followed by  Prednisone 20 mg daily for 5 days followed by  Prednisone 10 mg daily for 5 days and follow up with pulmonologist before discontinuing the prednisone. What changed:  additional instructions   tetrahydrozoline 0.05 % ophthalmic solution Place 1 drop into both eyes 4 (four) times daily as needed (For dry eyes.).   traMADol-acetaminophen 37.5-325 MG tablet Commonly known as:  ULTRACET Take 1-2 tablets by mouth 2 (two) times daily as needed for moderate pain.   TYLENOL 500 MG tablet Generic drug:  acetaminophen Take 500-1,000 mg by mouth every 4 (four) hours as needed for mild pain.   vitamin E 1000 UNIT capsule Take 1,000 Units by mouth 2 (two) times daily.            Durable Medical Equipment        Start     Ordered   01/14/17 1203  For home use only DME oxygen  Once    Question Answer Comment  Mode or (Route) Nasal cannula   Liters per Minute 2   Frequency Continuous (stationary and portable oxygen unit needed)   Oxygen conserving device Yes   Oxygen delivery system Gas      01/14/17 1202     Follow-up Information    Specialist, Adult Pediatric Follow up.   Why:  Home oxygen Contact information: [address] [place] [address] [phone]        Coralyn Helling, MD. Schedule an appointment as soon as possible for a visit in 2 week(s).   Specialty:  Pulmonary Disease Contact information: [address] [place]  [address] [phone]          Allergies  Allergen Reactions  . Codeine Sulfate Nausea And Vomiting and Other (See Comments)    headache    Consultations:  Cardiology  pulmonology   Procedures/Studies: Dg Chest 2 View  Result Date: 01/12/2017 CLINICAL DATA:  Shortness of breath EXAM: CHEST  2 VIEW COMPARISON:  12/31/2016 FINDINGS: Lungs are hyperexpanded. Interstitial markings are diffusely coarsened with chronic features. The cardiopericardial silhouette is within normal limits for size. The visualized bony structures of the thorax are intact. Telemetry leads overlie the chest. IMPRESSION: Features suggest emphysema.  No acute cardiopulmonary findings. Electronically Signed   By: Kennith Center M.D.   On: 01/12/2017 15:11   Dg Chest 2 View  Result Date: 12/31/2016  CLINICAL DATA:  83 year old female with shortness breath for 5 months. EXAM: CHEST  2 VIEW COMPARISON:  09/17/2016 FINDINGS: The cardiomediastinal silhouette is stable. Atherosclerotic calcifications are noted at the aortic arch. There is hyperinflation of the lungs without focal opacities pleural effusion or pneumothorax. Coarse interstitial markings appear similar to prior exam. No acute osseous abnormalities. IMPRESSION: 1. No acute cardiopulmonary disease. 2. Stable changes of COPD/emphysema. Electronically Signed   By: Sande Brothers M.D.   On: 12/31/2016 15:22   Ct Chest Wo Contrast  Result Date: 01/12/2017 CLINICAL DATA:  83 year old female with chronic dyspnea. Subsequent encounter. EXAM: CT CHEST WITHOUT CONTRAST TECHNIQUE: Multidetector CT imaging of the chest was performed following the standard protocol without IV contrast. COMPARISON:  01/12/2017 chest x-ray.  07/29/2016 chest CT. FINDINGS: Cardiovascular: Calcified aorta and great vessels. 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 Top-normal heart size. Three vessel coronary artery calcification. Mediastinum/Nodes: Top-normal size  precarinal lymph nodes unchanged. Lungs/Pleura: Moderate to marked centrilobular emphysema. No infiltrate or pulmonary edema. Scarring lung apices. Upper Abdomen: No acute abnormality upper abdomen. Musculoskeletal: Scoliosis and superimposed degenerative changes similar to prior exam without focal compression fracture. IMPRESSION: Moderate-to-marked emphysema without change. No acute pulmonary abnormality. Coronary artery calcifications. Aortic Atherosclerosis (ICD10-I70.0).  Emphysema (ICD10-J43.9). Electronically Signed   By: Lacy Duverney M.D.   On: 01/12/2017 18:06       Subjective: Reports feeling better. No new complaints.   Discharge Exam: Vitals:   01/13/17 2010 01/14/17 0600  BP: (!) 142/67 (!) 153/67  Pulse: 68 67  Resp: 18 18  Temp: 98.4 F (36.9 C) 98.6 F (37 C)  SpO2: 97% 100%   Vitals:   01/13/17 0909 01/13/17 1426 01/13/17 2010 01/14/17 0600  BP: (!) 140/58 140/76 (!) 142/67 (!) 153/67  Pulse:  74 68 67  Resp:  Temp:  (!) 97.5 F (36.4 C) 98.4 F (36.9 C) 98.6 F (37 C)  TempSrc:  Oral Oral Oral  SpO2:  94% 97% 100%  Weight:      Height:        General: Pt is alert, awake, not in acute distress Cardiovascular: RRR, S1/S2 +, no rubs, no gallops Respiratory: CTA bilaterally, no wheezing, no rhonchi Abdominal: Soft, NT, ND, bowel sounds + Extremities: no edema, no cyanosis    The results of significant diagnostics from this hospitalization (including imaging, microbiology, ancillary and laboratory) are listed below for reference.     Microbiology: Recent Results (from the past 240 hour(s))  Respiratory Panel by PCR     Status: None   Collection Time: 01/12/17 10:32 PM  Result Value Ref Range Status   Adenovirus NOT DETECTED NOT DETECTED Final   Coronavirus 229E NOT DETECTED NOT DETECTED Final   Coronavirus HKU1 NOT DETECTED NOT DETECTED Final   Coronavirus NL63 NOT DETECTED NOT DETECTED Final   Coronavirus OC43 NOT DETECTED NOT DETECTED  Final   Metapneumovirus NOT DETECTED NOT DETECTED Final   Rhinovirus / Enterovirus NOT DETECTED NOT DETECTED Final   Influenza A NOT DETECTED NOT DETECTED Final   Influenza B NOT DETECTED NOT DETECTED Final   Parainfluenza Virus 1 NOT DETECTED NOT DETECTED Final   Parainfluenza Virus 2 NOT DETECTED NOT DETECTED Final   Parainfluenza Virus 3 NOT DETECTED NOT DETECTED Final   Parainfluenza Virus 4 NOT DETECTED NOT DETECTED Final   Respiratory Syncytial Virus NOT DETECTED NOT DETECTED Final   Bordetella pertussis NOT DETECTED NOT DETECTED Final   Chlamydophila pneumoniae NOT  DETECTED NOT DETECTED Final   Mycoplasma pneumoniae NOT DETECTED NOT DETECTED Final    Comment: Performed at [facility], [address], [place], [place] [address]     Labs: BNP (last 3 results)  Recent Labs  07/29/16 0941 09/05/16 1919 01/12/17 1507  BNP 218.4* 416.5* 193.4*   Basic Metabolic Panel:  Recent Labs Lab 01/12/17 1507 01/13/17 0534 01/14/17 0516  NA 137 137 139  K 3.4* 5.3* 3.8  CL 98* 103 100*  CO2 GLUCOSE 92 152* 150*  BUN 30* 36* 52*  CREATININE 1.90* 1.87* 1.89*  CALCIUM 9.0 9.0 9.4  MG  --  2.3  --    Liver Function Tests: No results for input(s): AST, ALT, ALKPHOS, BILITOT, PROT, ALBUMIN in the last 168 hours. No results for input(s): LIPASE, AMYLASE in the last 168 hours. No results for input(s): AMMONIA in the last 168 hours. CBC:  Recent Labs Lab 01/12/17 1507  WBC 14.4*  NEUTROABS 10.7*  HGB 15.6*  HCT 46.0  MCV 90.6  PLT 353   Cardiac Enzymes:  Recent Labs Lab 01/12/17 1507  TROPONINI <0.03   BNP: Invalid input(s): POCBNP CBG: No results for input(s): GLUCAP in the last 168 hours. D-Dimer No results for input(s): DDIMER in the last 72 hours. Hgb A1c No results for input(s): HGBA1C in the last 72 hours. Lipid Profile No results for input(s): CHOL, HDL, LDLCALC, TRIG, CHOLHDL, LDLDIRECT in the last 72 hours. Thyroid function  studies No results for input(s): TSH, T4TOTAL, T3FREE, THYROIDAB in the last 72 hours.  Invalid input(s): FREET3 Anemia work up No results for input(s): VITAMINB12, FOLATE, FERRITIN, TIBC, IRON, RETICCTPCT in the last 72 hours. Urinalysis    Component Value Date/Time   COLORURINE STRAW (A) 04/14/2016 1345   APPEARANCEUR CLEAR 04/14/2016 1345   LABSPEC 1.003 (L) 04/14/2016 1345   PHURINE 6.0 04/14/2016 1345   GLUCOSEU NEGATIVE 04/14/2016 1345   HGBUR NEGATIVE 04/14/2016 1345   BILIRUBINUR NEGATIVE 04/14/2016 1345   KETONESUR NEGATIVE 04/14/2016 1345   PROTEINUR NEGATIVE 04/14/2016 1345   NITRITE NEGATIVE 04/14/2016 1345   LEUKOCYTESUR NEGATIVE 04/14/2016 1345   Sepsis Labs Invalid input(s): PROCALCITONIN,  WBC,  LACTICIDVEN Microbiology Recent Results (from the past 240 hour(s))  Respiratory Panel by PCR     Status: None   Collection Time: 01/12/17 10:32 PM  Result Value Ref Range Status   Adenovirus NOT DETECTED NOT DETECTED Final   Coronavirus 229E NOT DETECTED NOT DETECTED Final   Coronavirus HKU1 NOT DETECTED NOT DETECTED Final   Coronavirus NL63 NOT DETECTED NOT DETECTED Final   Coronavirus OC43 NOT DETECTED NOT DETECTED Final   Metapneumovirus NOT DETECTED NOT DETECTED Final   Rhinovirus / Enterovirus NOT DETECTED NOT DETECTED Final   Influenza A NOT DETECTED NOT DETECTED Final   Influenza B NOT DETECTED NOT DETECTED Final   Parainfluenza Virus 1 NOT DETECTED NOT DETECTED Final   Parainfluenza Virus 2 NOT DETECTED NOT DETECTED Final   Parainfluenza Virus 3 NOT DETECTED NOT DETECTED Final   Parainfluenza Virus 4 NOT DETECTED NOT DETECTED Final   Respiratory Syncytial Virus NOT DETECTED NOT DETECTED Final   Bordetella pertussis NOT DETECTED NOT DETECTED Final   Chlamydophila pneumoniae NOT DETECTED NOT DETECTED Final   Mycoplasma pneumoniae NOT DETECTED NOT DETECTED Final    Comment: Performed at [facility], [address], [place], [place] [address]      Time coordinating discharge: Over 30 minutes  SIGNED:  Kathlen Mody, MD  [facility] 01/14/2017, 1:10 PM Pager   If 7PM-7AM, please contact night-coverage [email or web address] Password [email or web address]

## 2017-01-14 NOTE — Care Management Note (Signed)
Case Management Note  Patient Details  Name: [name] MRN: [id number] Date of Birth: 29-Aug-1927  Subjective/Objective:  [facility](contract w/facility)faxed w/confirmation [facility]PT order. Already receives home 02[facility]prn [facility]-[phone], spoke t Selena Batten rep-Has contrentor,portable travel tank. Will fax 02 sats & home 02 order once all steps are completed-Nursing aware of 02 sats needed.                  Action/Plan:d/c back to [facility].   Expected Discharge Date:   (unknown)               Expected Discharge Plan:  Home w Home Health Services  In-House Referral:     Discharge planning Services  CM Consult  Post Acute Care Choice:  Durable Medical Equipment (rw, home 02-[facility],has travel tank) Choice offered to:  Patient  DME Arranged:    DME Agency:     HH Arranged:  PT HH Agency:  Other - See comment ([facility]-they have their own contract w/[facility])  Status of Service:  Completed, signed off  If discussed at Long Length of Stay Meetings, dates discussed:    Additional Comments:  Lanier Clam, RN 01/14/2017, 12:36 PM

## 2017-01-14 NOTE — Care Management Note (Signed)
Case Management Note  Patient Details  Name: [name] MRN: [id number] Date of Birth: January 20, 1928  Subjective/Objective: Faxed w/confirmation 02 sats,& home 02 order to [facility]-patient already has travel tank for home. [facility] orders faxed w/confirmation. No further CM needs.                   Action/Plan:d/c home w/HHC/new orders for home 02.   Expected Discharge Date:  01/14/17               Expected Discharge Plan:  Home w Home Health Services  In-House Referral:     Discharge planning Services  CM Consult  Post Acute Care Choice:  Durable Medical Equipment (rw, home 02-[facility],has travel tank) Choice offered to:  Patient  DME Arranged:  Oxygen DME Agency:  [facility]  HH Arranged:  PT HH Agency:  Other - See comment ([facility]-they have their own contract w/[facility])  Status of Service:  Completed, signed off  If discussed at Long Length of Stay Meetings, dates discussed:    Additional Comments:  Lanier Clam, RN 01/14/2017, 1:28 PM

## 2017-01-15 ENCOUNTER — Telehealth: Payer: Self-pay | Admitting: Pulmonary Disease

## 2017-01-15 MED ORDER — PREDNISONE 10 MG PO TABS: ORAL_TABLET | ORAL | 0 refills | Status: DC

## 2017-01-15 NOTE — Telephone Encounter (Signed)
rx sent to preferred pharmacy.  Pt aware.  Nothing further needed.  

## 2017-01-15 NOTE — Telephone Encounter (Signed)
Please send script for prednisone 10 mg pills >> 3 pills daily for 4 days, 2 pills daily for 5 days, 1 pill daily for 5 days.  Dispense 27 pills with no refills.

## 2017-01-15 NOTE — Telephone Encounter (Signed)
Spoke with patient's husband Shon Baton. He stated that the patient was discharged from the hospital yesterday. She was to have a RX for Prednisone to be sent to [facility]. All other medications were sent in except this medication. Advised him it appears the RX was printed. He stated that he did not see this RX with her discharge papers.   Sheela Stack that I would need to get VS' permission to send this in with his name on since he was not the original prescriber. He verbalized understanding.   VS, please advise if you are ok with [place] calling this medication. Thanks!

## 2017-01-30 ENCOUNTER — Encounter: Payer: Self-pay | Admitting: Adult Health

## 2017-01-30 ENCOUNTER — Ambulatory Visit (INDEPENDENT_AMBULATORY_CARE_PROVIDER_SITE_OTHER): Payer: Medicare Other | Admitting: Adult Health

## 2017-01-30 DIAGNOSIS — I779 Disorder of arteries and arterioles, unspecified: Secondary | ICD-10-CM

## 2017-01-30 DIAGNOSIS — J9611 Chronic respiratory failure with hypoxia: Secondary | ICD-10-CM

## 2017-01-30 DIAGNOSIS — J449 Chronic obstructive pulmonary disease, unspecified: Secondary | ICD-10-CM | POA: Diagnosis not present

## 2017-01-30 MED ORDER — UMECLIDINIUM-VILANTEROL 62.5-25 MCG/INH IN AEPB: 1.0000 | INHALATION_SPRAY | Freq: Every day | RESPIRATORY_TRACT | 5 refills | Status: AC

## 2017-01-30 MED ORDER — UMECLIDINIUM-VILANTEROL 62.5-25 MCG/INH IN AEPB: 1.0000 | INHALATION_SPRAY | Freq: Every day | RESPIRATORY_TRACT | 0 refills | Status: DC

## 2017-01-30 NOTE — Assessment & Plan Note (Signed)
Frequent exacerbations - trial of ANORO   Plan  Patient Instructions  [facility] Inhaler 1 puff daily. , rinse after use.  Continue on Oxygen 2l/m .  Advance activity as tolerated.  Follow up with Dr. Jamison NeighborNestor or Dr. Craige CottaSood in 6-8 weeks and As needed

## 2017-01-30 NOTE — Progress Notes (Signed)
I have reviewed and agree with assessment/plan.  Coralyn HellingVineet Desirai Traxler, MD [facility] 01/30/2017, 2:03 PM Pager:  [phone]

## 2017-01-30 NOTE — Assessment & Plan Note (Signed)
Cont on O2 .  

## 2017-01-30 NOTE — Progress Notes (Signed)
@Patient  ID: [name], female    DOB: Aug 11, 1927, 83 y.o.   MRN: [id number]  Chief Complaint  Patient presents with  . Follow-up    COPD     Referring provider: Coralyn Helling, MD  HPI: 83 yo female former smoker seen for pulmonary consult during hospitalization on 04/16/16 for CAP   TEST  Echo 1/09 /18>> EF 55 to 60%, PAS 33 mmHg Spirometry 1/11 >> FVC 1.93 (86%), FEV1% 1.15 (70%), FEV1% 60  01/30/2017 Follow up : COPD exacerbation  Pt returns for a post hospital follow up . She was was admitted earlier this month for COPD flare . She was treated with steroids and nebulized BD. CT chest showed marked Emphysema  , no acute process.  She remains on INCRUSE.  She says she got very weak and near syncope that led to her admission.  Echo showed EF was nml, GR 2 DD . Viral neg. Card panel was neg tropoinin and bnp low.  3 hospitalizations this year mainly for breathing issues.  She is  getting PT at home.  She is feeling  Better, breathing has returned to baseline. No flare of cough or wheezing .      Allergies  Allergen Reactions  . Codeine Sulfate Nausea And Vomiting and Other (See Comments)    headache    Immunization History  Administered Date(s) Administered  . Influenza Split 12/30/2016  . Influenza, High Dose Seasonal PF 01/05/2016  . Influenza-Unspecified 12/06/2013  . Pneumococcal Polysaccharide-23 04/07/1994    Past Medical History:  Diagnosis Date  . Anticoagulated    Eliquis  . Atrial flutter (HCC)    NO CARDIOLOGIST FOLLOWED BY DR Tori Milks  . Bloody stools    LAST 4 DAYS  . CHF (congestive heart failure) (HCC)   . Chronic back pain   . CKD (chronic kidney disease), stage III (HCC)    Creatinine 1.35 -1.55  . COPD (chronic obstructive pulmonary disease) (HCC)   . Diverticulosis   . Dyspnea   . Dysrhythmia   . Edema   . Gait disorder   . Glaucoma    Severe  . Glaucoma    both eyes  . High cholesterol   . Multinodular goiter   . PAOD  (peripheral arterial occlusive disease) (HCC)    Stein in right groin, decrease pulses  . PMR (polymyalgia rheumatica) (HCC)   . Pneumonia, organism unspecified(486) 04/18/2016  . Protein-calorie malnutrition (HCC)   . Rash of back   . Weakness     Tobacco History: History  Smoking Status  . Former Smoker  . Packs/day: 1.00  . Years: 15.00  . Types: Cigarettes  . Quit date: 07/12/1993  Smokeless Tobacco  . Never Used   Counseling given: Not Answered   Outpatient Encounter Prescriptions as of 01/30/2017  Medication Sig  . acetaminophen (TYLENOL) 500 MG tablet Take 500-1,000 mg by mouth every 4 (four) hours as needed for mild pain.   Marland Kitchen albuterol (PROAIR HFA) 108 (90 Base) MCG/ACT inhaler Inhale 2 puffs into the lungs every 6 (six) hours as needed for wheezing or shortness of breath.  Marland Kitchen albuterol (PROVENTIL) (2.5 MG/3ML) 0.083% nebulizer solution Take 3 mLs (2.5 mg total) by nebulization every 6 (six) hours as needed for wheezing or shortness of breath. DX: J44.9  . atorvastatin (LIPITOR) 10 MG tablet Take 5 mg by mouth daily.   . benzonatate (TESSALON) 100 MG capsule Take 1 capsule (100 mg total) by mouth 3 (three) times daily as  needed for cough.  . bimatoprost (LUMIGAN) 0.01 % SOLN Place 1 drop into both eyes at bedtime.  . cholecalciferol (VITAMIN D) 1000 UNITS tablet Take 1,000 Units by mouth 2 (two) times daily.   Marland Kitchen. diltiazem (CARDIZEM CD) 360 MG 24 hr capsule Take 1 capsule (360 mg total) by mouth daily.  Marland Kitchen. ELIQUIS 2.5 MG TABS tablet Take 2.5 mg by mouth 2 (two) times daily.  . feeding supplement, ENSURE ENLIVE, (ENSURE ENLIVE) LIQD Take 237 mLs by mouth 2 (two) times daily between meals.  . furosemide (LASIX) 20 MG tablet Take 1 tablet (20 mg total) by mouth daily.  . OXYGEN Inhale 2 L into the lungs daily.  . potassium chloride (K-DUR,KLOR-CON) 10 MEQ tablet Take 10 mEq by mouth 2 (two) times daily.  . predniSONE (DELTASONE) 10 MG tablet 30mg  daily X 4 days, 20 mg daily X 5  days,10 mg daily X 5 days  . tetrahydrozoline 0.05 % ophthalmic solution Place 1 drop into both eyes 4 (four) times daily as needed (For dry eyes.).  Marland Kitchen. traMADol-acetaminophen (ULTRACET) 37.5-325 MG per tablet Take 1-2 tablets by mouth 2 (two) times daily as needed for moderate pain.   Marland Kitchen. umeclidinium bromide (INCRUSE ELLIPTA) 62.5 MCG/INH AEPB Inhale 1 puff into the lungs daily.  . vitamin E 1000 UNIT capsule Take 1,000 Units by mouth 2 (two) times daily.    No facility-administered encounter medications on file as of 01/30/2017.      Review of Systems  Constitutional:   No  weight loss, night sweats,  Fevers, chills,  +fatigue, or  lassitude.  HEENT:   No headaches,  Difficulty swallowing,  Tooth/dental problems, or  Sore throat,                No sneezing, itching, ear ache, nasal congestion, post nasal drip,   CV:  No chest pain,  Orthopnea, PND, swelling in lower extremities, anasarca, dizziness, palpitations, syncope.   GI  No heartburn, indigestion, abdominal pain, nausea, vomiting, diarrhea, change in bowel habits, loss of appetite, bloody stools.   Resp:    No chest wall deformity  Skin: no rash or lesions.  GU: no dysuria, change in color of urine, no urgency or frequency.  No flank pain, no hematuria   MS:  No joint pain or swelling.  No decreased range of motion.  No back pain.    Physical Exam  BP 130/84 (BP Location: Right Arm, Cuff Size: Normal)   Pulse 74   Ht 5\' 3"  (1.6 m)   Wt 112 lb (50.8 kg)   SpO2 93%   BMI 19.84 kg/m   GEN: A/Ox3; pleasant , NAD, thin and frail on o2, walker    HEENT:  Benedict/AT,  EACs-clear, TMs-wnl, NOSE-clear, THROAT-clear, no lesions, no postnasal drip or exudate noted.   NECK:  Supple w/ fair ROM; no JVD; normal carotid impulses w/o bruits; no thyromegaly or nodules palpated; no lymphadenopathy.    RESP  Decreased BS in bases ,  no accessory muscle use, no dullness to percussion  CARD:  RRR, no m/r/g, no peripheral edema, pulses  intact, no cyanosis or clubbing.  GI:   Soft & nt; nml bowel sounds; no organomegaly or masses detected.   Musco: Warm bil, no deformities or joint swelling noted.   Neuro: alert, no focal deficits noted.    Skin: Warm, no lesions or rashes    Lab Results:  CBC  BMET  ProBNP No results found for: PROBNP  Imaging: Dg  Chest 2 View  Result Date: 01/12/2017 CLINICAL DATA:  Shortness of breath EXAM: CHEST  2 VIEW COMPARISON:  12/31/2016 FINDINGS: Lungs are hyperexpanded. Interstitial markings are diffusely coarsened with chronic features. The cardiopericardial silhouette is within normal limits for size. The visualized bony structures of the thorax are intact. Telemetry leads overlie the chest. IMPRESSION: Features suggest emphysema.  No acute cardiopulmonary findings. Electronically Signed   By: Kennith Center M.D.   On: 01/12/2017 15:11   Dg Chest 2 View  Result Date: 12/31/2016 CLINICAL DATA:  83 year old female with shortness breath for 5 months. EXAM: CHEST  2 VIEW COMPARISON:  09/17/2016 FINDINGS: The cardiomediastinal silhouette is stable. Atherosclerotic calcifications are noted at the aortic arch. There is hyperinflation of the lungs without focal opacities pleural effusion or pneumothorax. Coarse interstitial markings appear similar to prior exam. No acute osseous abnormalities. IMPRESSION: 1. No acute cardiopulmonary disease. 2. Stable changes of COPD/emphysema. Electronically Signed   By: Sande Brothers M.D.   On: 12/31/2016 15:22   Ct Chest Wo Contrast  Result Date: 01/12/2017 CLINICAL DATA:  83 year old female with chronic dyspnea. Subsequent encounter. EXAM: CT CHEST WITHOUT CONTRAST TECHNIQUE: Multidetector CT imaging of the chest was performed following the standard protocol without IV contrast. COMPARISON:  01/12/2017 chest x-ray.  07/29/2016 chest CT. FINDINGS: Cardiovascular: Calcified aorta and great vessels. Aberrant right subclavian artery traverses posterior to the  esophagus. Top-normal heart size. Three vessel coronary artery calcification. Mediastinum/Nodes: Top-normal size precarinal lymph nodes unchanged. Lungs/Pleura: Moderate to marked centrilobular emphysema. No infiltrate or pulmonary edema. Scarring lung apices. Upper Abdomen: No acute abnormality upper abdomen. Musculoskeletal: Scoliosis and superimposed degenerative changes similar to prior exam without focal compression fracture. IMPRESSION: Moderate-to-marked emphysema without change. No acute pulmonary abnormality. Coronary artery calcifications. Aortic Atherosclerosis (ICD10-I70.0).  Emphysema (ICD10-J43.9). Electronically Signed   By: Lacy Duverney M.D.   On: 01/12/2017 18:06     Assessment & Plan:   COPD (chronic obstructive pulmonary disease) (HCC) Frequent exacerbations - trial of ANORO   Plan  Patient Instructions  Change INCRUSE to [facility] Inhaler 1 puff daily. , rinse after use.  Continue on Oxygen 2l/m .  Advance activity as tolerated.  Follow up with Dr. Jamison Neighbor or Dr. Craige Cotta in 6-8 weeks and As needed        Chronic respiratory failure with hypoxia (HCC) Cont on O2      Mayra Jolliffe, NP 01/30/2017

## 2017-01-30 NOTE — Addendum Note (Signed)
Addended by: Boone MasterJONES, Ader Fritze E on: 01/30/2017 11:21 AM   Modules accepted: Orders

## 2017-01-30 NOTE — Progress Notes (Signed)
Note reviewed.  Jennings E. Nestor, M.D. [facility] Pager:  [phone] After 7pm or if no response, call [phone] 1:05 PM 01/30/17    

## 2017-01-30 NOTE — Patient Instructions (Addendum)
[facility] Inhaler 1 puff daily. , rinse after use.  Continue on Oxygen 2l/m .  Advance activity as tolerated.  Follow up with Dr. Jamison NeighborNestor or Dr. Craige CottaSood in 6-8 weeks and As needed

## 2017-02-16 ENCOUNTER — Encounter: Payer: Self-pay | Admitting: Cardiology

## 2017-02-16 ENCOUNTER — Other Ambulatory Visit: Payer: Self-pay | Admitting: Pulmonary Disease

## 2017-02-28 NOTE — Progress Notes (Signed)
Cardiology Office Note   Date:  03/02/2017   ID:  [name], DOB [birth year], MRN [id number]  PCP:  Coralyn Helling, MD  Cardiologist:   Peter Swaziland, MD   Chief Complaint  Patient presents with  . Atrial Fibrillation      History of Present Illness: [name] is a 83 y.o. female who presents for  follow up of atrial fibrillation. She has a long history of paroxysmal atrial fibrillation/flutter managed with rate control with Cardizem. She is on Eliquis.  Did take Xarelto in the past but had some increased bleeding on this. Has tolerated Eliquis well without bleeding. She has a history of CKD, PAD (50% carotid disease) and diastolic CHF. She also has COPD.  She was admitted to [facility] in January 2017 with PNA and the flu. Was initially in NSR but went into AFib in hospital. Rate controlled. She was discharged to SNF for one month. Required diuretics for edema but was discharged home on no diuretic. She was readmitted in April 2018 with increased weakness and SOB. She was in atrial flutter with some increased VR. Rate controlled with Cardizem. She did convert to NSR. She was treated with IV lasix with some exacerbation of her CKD. Maintained on Eliquis 2.5 mg bid. ASA stopped. Remained on home oxygen. Not felt to be a candidate for AAD therapy due to severe COPD and CKD. Echo showed normal LV and valvular function. She was also treated with steroids and nebulizers.   She was readmitted in early June with COPD exacerbation due to RLL PNA. Treated with oxygen, steroids and antibiotics. Was in AFlutter with controlled rate.   She was admitted again in early October with respiratory failure with hypoxia. She was initially in Afib with controlled rate but converted to NSR. Repeat Echo showed no change. She experienced an episode of near syncope and outpatient event monitor planned but apparently never ordered.   On follow up today she states her breathing is about the same. No cough or edema.   No chest pain. Denies any palpitations or tachycardia. She has no further dizziness or near syncope.  Now living at [facility].    Past Medical History:  Diagnosis Date  . Anticoagulated    Eliquis  . Atrial flutter (HCC)    NO CARDIOLOGIST FOLLOWED BY DR Tori Milks  . Bloody stools    LAST 4 DAYS  . CHF (congestive heart failure) (HCC)   . Chronic back pain   . CKD (chronic kidney disease), stage III (HCC)    Creatinine 1.35 -1.55  . COPD (chronic obstructive pulmonary disease) (HCC)   . Diverticulosis   . Dyspnea   . Dysrhythmia   . Edema   . Gait disorder   . Glaucoma    Severe  . Glaucoma    both eyes  . High cholesterol   . Multinodular goiter   . PAOD (peripheral arterial occlusive disease) (HCC)    Stein in right groin, decrease pulses  . PMR (polymyalgia rheumatica) (HCC)   . Pneumonia, organism unspecified(486) 04/18/2016  . Protein-calorie malnutrition (HCC)   . Rash of back   . Weakness     Past Surgical History:  Procedure Laterality Date  . COLONOSCOPY N/A 07/15/2013   Procedure: COLONOSCOPY;  Surgeon: Louis Meckel, MD;  Location: [facility] ENDOSCOPY;  Service: Endoscopy;  Laterality: N/A;  . ESOPHAGOGASTRODUODENOSCOPY N/A 07/08/2013   Procedure: ESOPHAGOGASTRODUODENOSCOPY (EGD);  Surgeon: Louis Meckel, MD;  Location: [facility] ENDOSCOPY;  Service: Endoscopy;  Laterality: N/A;  Per Connye BurkittSuzi Brewer, will try to have CRNA available, but will not guaranteee. Ok to schedule per Clydie BraunKaren 07/06/13  . GALLBLADDER SURGERY    . IR GENERIC HISTORICAL  05/18/2014   IR RADIOLOGIST EVAL & MGMT 05/18/2014 Berdine DanceMichael Shick, MD [facility] INTERV RAD  . LAMINECTOMY    . leg stent Right   . SHOULDER SURGERY     frozen shoulder/ Bil shoulders     Current Outpatient Medications  Medication Sig Dispense Refill  . acetaminophen (TYLENOL) 500 MG tablet Take 500-1,000 mg by mouth every 4 (four) hours as needed for mild pain.     Marland Kitchen. albuterol (PROAIR HFA) 108 (90 Base) MCG/ACT inhaler Inhale  2 puffs into the lungs every 6 (six) hours as needed for wheezing or shortness of breath. 1 Inhaler 3  . albuterol (PROVENTIL) (2.5 MG/3ML) 0.083% nebulizer solution Take 3 mLs (2.5 mg total) by nebulization every 6 (six) hours as needed for wheezing or shortness of breath. DX: J44.9 75 mL 5  . atorvastatin (LIPITOR) 10 MG tablet Take 5 mg by mouth daily.     . benzonatate (TESSALON) 100 MG capsule Take 1 capsule (100 mg total) by mouth 3 (three) times daily as needed for cough. 20 capsule 0  . bimatoprost (LUMIGAN) 0.01 % SOLN Place 1 drop into both eyes at bedtime.    . cholecalciferol (VITAMIN D) 1000 UNITS tablet Take 1,000 Units by mouth 2 (two) times daily.     Marland Kitchen. diltiazem (CARDIZEM CD) 360 MG 24 hr capsule Take 1 capsule (360 mg total) by mouth daily. 30 capsule 0  . ELIQUIS 2.5 MG TABS tablet Take 2.5 mg by mouth 2 (two) times daily.    . feeding supplement, ENSURE ENLIVE, (ENSURE ENLIVE) LIQD Take 237 mLs by mouth 2 (two) times daily between meals. 60 Bottle 0  . furosemide (LASIX) 20 MG tablet Take 1 tablet (20 mg total) by mouth daily. 30 tablet 0  . OXYGEN Inhale 2 L into the lungs daily.    . potassium chloride (K-DUR,KLOR-CON) 10 MEQ tablet Take 10 mEq by mouth 2 (two) times daily.    . predniSONE (DELTASONE) 10 MG tablet TAKE 3 TABS/DAY X4DAYS, THEN 2 TABS/DAY X5DAYS, THEN 1 TAB/DAY X5 DAYS, 27 tablet 0  . tetrahydrozoline 0.05 % ophthalmic solution Place 1 drop into both eyes 4 (four) times daily as needed (For dry eyes.).    Marland Kitchen. traMADol-acetaminophen (ULTRACET) 37.5-325 MG per tablet Take 1-2 tablets by mouth 2 (two) times daily as needed for moderate pain.     Marland Kitchen. umeclidinium bromide (INCRUSE ELLIPTA) 62.5 MCG/INH AEPB Inhale 1 puff into the lungs daily.    Marland Kitchen. umeclidinium-vilanterol (ANORO ELLIPTA) 62.5-25 MCG/INH AEPB Inhale 1 puff into the lungs daily. 1 each 5  . umeclidinium-vilanterol (ANORO ELLIPTA) 62.5-25 MCG/INH AEPB Inhale 1 puff into the lungs daily. 7 each 0  . vitamin E  1000 UNIT capsule Take 1,000 Units by mouth 2 (two) times daily.      No current facility-administered medications for this visit.     Allergies:   Codeine sulfate    Social History:  The patient  reports that she quit smoking about 23 years ago. Her smoking use included cigarettes. She has a 15.00 pack-year smoking history. she has never used smokeless tobacco. She reports that she does not drink alcohol or use drugs.   Family History:  The patient's family history includes Cirrhosis in her maternal aunt; Diabetes in her maternal uncle;  Throat cancer in her maternal grandfather and mother.    ROS:  Please see the history of present illness.   Otherwise, review of systems are positive for none.   All other systems are reviewed and negative.    PHYSICAL EXAM: VS:  BP 98/62   Pulse 87   Ht 5\' 3"  (1.6 m)   Wt 113 lb (51.3 kg)   SpO2 (!) 70%   BMI 20.02 kg/m  , BMI Body mass index is 20.02 kg/m. Repeat oxygen sat 90% after rest.  GENERAL:  Well appearing WF in NAD HEENT:  PERRL, EOMI, sclera are clear. Oropharynx is clear. NECK:  No jugular venous distention, carotid upstroke brisk and symmetric, no bruits, no thyromegaly or adenopathy LUNGS:  Clear to auscultation bilaterally CHEST:  Unremarkable HEART:  RRR,  PMI not displaced or sustained,S1 and S2 within normal limits, no S3, no S4: no clicks, no rubs, no murmurs ABD:  Soft, nontender. BS +, no masses or bruits. No hepatomegaly, no splenomegaly EXT:  2 + pulses throughout, no edema, no cyanosis no clubbing SKIN:  Warm and dry.  No rashes NEURO:  Alert and oriented x 3. Cranial nerves II through XII intact. PSYCH:  Cognitively intact     EKG:  EKG is not ordered today. The ekg ordered today demonstrates N/A   Recent Labs: 05/15/2016: ALT 17; TSH 3.58 01/12/2017: B Natriuretic Peptide 193.4; Hemoglobin 15.6; Platelets 353 01/13/2017: Magnesium 2.3 01/14/2017: BUN 52; Creatinine, Ser 1.89; Potassium 3.8; Sodium 139     Lipid Panel No results found for: CHOL, TRIG, HDL, CHOLHDL, VLDL, LDLCALC, LDLDIRECT    Wt Readings from Last 3 Encounters:  03/02/17 113 lb (51.3 kg)  01/30/17 112 lb (50.8 kg)  01/12/17 110 lb 4.8 oz (50 kg)      Other studies Reviewed: Echo: 07/30/16: Study Conclusions  - Left ventricle: The cavity size was normal. Wall thickness was   normal. Indeterminant diastolic function (atrial flutter).   Systolic function was normal. The estimated ejection fraction was   in the range of 55% to 60%. Wall motion was normal; there were no   regional wall motion abnormalities. - Aortic valve: There was no stenosis. - Mitral valve: There was trivial regurgitation. - Right ventricle: The cavity size was normal. Systolic function   was mildly reduced. - Tricuspid valve: Peak RV-RA gradient (S): 31 mm Hg. - Pulmonary arteries: PA peak pressure: 34 mm Hg (S). - Inferior vena cava: The vessel was normal in size. The   respirophasic diameter changes were in the normal range (>= 50%),   consistent with normal central venous pressure.  Impressions:  - The patient was in atrial flutter. Normal LV size with EF 55-60%.   Normal RV size with mildly decreased systolic function. No   significant valvular abnormalities.   Echo 01/13/17: Study Conclusions  - Left ventricle: The cavity size was normal. Systolic function was   normal. The estimated ejection fraction was in the range of 60%   to 65%. Wall motion was normal; there were no regional wall   motion abnormalities. Features are consistent with a pseudonormal   left ventricular filling pattern, with concomitant abnormal   relaxation and increased filling pressure (grade 2 diastolic   dysfunction). - Aortic valve: Trileaflet; mildly thickened, mildly calcified   leaflets. - Mitral valve: There was mild regurgitation. - Tricuspid valve: There was mild regurgitation. - Pulmonic valve: Cusp separation was normal. - Pulmonary  arteries: Systolic pressure could not be accurately  estimated.   ASSESSMENT AND PLAN:  1.  Atrial fibrillation/flutter. Pulse is regular today. She is asymptomatic. Continue Cardizem and Eliquis. Eliquis dose reduced due to age and renal dysfunction. 2. Chronic diastolic CHF- appears well compensated today. Continue lasix 20 mg daily. Sodium restriction 3. CKD stage 3-4.  4. Severe COPD on home oxygen. Followed by pulmonary.   Current medicines are reviewed at length with the patient today.  The patient does not have concerns regarding medicines.  The following changes have been made:  no change  Labs/ tests ordered today include:  No orders of the defined types were placed in this encounter.    Disposition:   FU with me in 6 months  Signed, Peter Swaziland, MD  03/02/2017 1:39 PM    [facility] [address] [place], [place], [address] Phone [phone], Fax [phone]

## 2017-03-02 ENCOUNTER — Ambulatory Visit (INDEPENDENT_AMBULATORY_CARE_PROVIDER_SITE_OTHER): Payer: Medicare Other | Admitting: Cardiology

## 2017-03-02 ENCOUNTER — Encounter: Payer: Self-pay | Admitting: Cardiology

## 2017-03-02 VITALS — BP 98/62 | HR 87 | Ht 63.0 in | Wt 113.0 lb

## 2017-03-02 DIAGNOSIS — I779 Disorder of arteries and arterioles, unspecified: Secondary | ICD-10-CM

## 2017-03-02 DIAGNOSIS — I5032 Chronic diastolic (congestive) heart failure: Secondary | ICD-10-CM

## 2017-03-02 DIAGNOSIS — I480 Paroxysmal atrial fibrillation: Secondary | ICD-10-CM

## 2017-03-02 DIAGNOSIS — N183 Chronic kidney disease, stage 3 unspecified: Secondary | ICD-10-CM

## 2017-03-02 NOTE — Patient Instructions (Signed)
Continue your current therapy  I will see you in 6 months.   

## 2017-03-13 ENCOUNTER — Ambulatory Visit: Payer: Medicare Other | Admitting: Pulmonary Disease

## 2017-03-20 ENCOUNTER — Ambulatory Visit: Payer: Medicare Other | Admitting: Adult Health

## 2017-05-12 ENCOUNTER — Encounter: Payer: Self-pay | Admitting: Adult Health

## 2017-05-12 ENCOUNTER — Ambulatory Visit (INDEPENDENT_AMBULATORY_CARE_PROVIDER_SITE_OTHER): Payer: Medicare Other | Admitting: Adult Health

## 2017-05-12 DIAGNOSIS — J449 Chronic obstructive pulmonary disease, unspecified: Secondary | ICD-10-CM

## 2017-05-12 DIAGNOSIS — J9611 Chronic respiratory failure with hypoxia: Secondary | ICD-10-CM

## 2017-05-12 NOTE — Progress Notes (Signed)
@Patient  ID: [name], female    DOB: 06/29/1927, 84 y.o.   MRN: [id number]  Chief Complaint  Patient presents with  . Follow-up    COPD     Referring provider: Coralyn Helling, MD  HPI: 84 yo female former smoker seen for pulmonary consult during hospitalization on 04/16/16 for CAP   TEST  Echo 1/09 /18>> EF 55 to 60%, PAS 33 mmHg Spirometry 1/11 >> FVC 1.93 (86%), FEV1% 1.15 (70%), FEV1% 60 Echo showed EF was nml, GR 2 DD   05/12/2017 Follow up : COPD  Patient presents for a 108-month follow-up.  Patient has known moderate COPD.  Patient remains on INCRUSE. daily . She was tried on [facility] last ov but did not feel she needed this. Likes the [place] feels it really helps. No flare of cough or wheezing .   Remains on O2 . a  Flu , PVX and Prevnar 13 utd reported per pt .    Allergies  Allergen Reactions  . Codeine Sulfate Nausea And Vomiting and Other (See Comments)    headache    Immunization History  Administered Date(s) Administered  . Influenza Split 12/30/2016  . Influenza, High Dose Seasonal PF 01/05/2016  . Influenza-Unspecified 12/06/2013  . Pneumococcal Polysaccharide-23 04/07/1994    Past Medical History:  Diagnosis Date  . Anticoagulated    Eliquis  . Atrial flutter (HCC)    NO CARDIOLOGIST FOLLOWED BY DR Tori Milks  . Bloody stools    LAST 4 DAYS  . CHF (congestive heart failure) (HCC)   . Chronic back pain   . CKD (chronic kidney disease), stage III (HCC)    Creatinine 1.35 -1.55  . COPD (chronic obstructive pulmonary disease) (HCC)   . Diverticulosis   . Dyspnea   . Dysrhythmia   . Edema   . Gait disorder   . Glaucoma    Severe  . Glaucoma    both eyes  . High cholesterol   . Multinodular goiter   . PAOD (peripheral arterial occlusive disease) (HCC)    Stein in right groin, decrease pulses  . PMR (polymyalgia rheumatica) (HCC)   . Pneumonia, organism unspecified(486) 04/18/2016  . Protein-calorie malnutrition (HCC)   . Rash of back     . Weakness     Tobacco History: Social History   Tobacco Use  Smoking Status Former Smoker  . Packs/day: 1.00  . Years: 15.00  . Pack years: 15.00  . Types: Cigarettes  . Last attempt to quit: 07/12/1993  . Years since quitting: 23.8  Smokeless Tobacco Never Used   Counseling given: Not Answered   Outpatient Encounter Medications as of 05/12/2017  Medication Sig  . acetaminophen (TYLENOL) 500 MG tablet Take 500-1,000 mg by mouth every 4 (four) hours as needed for mild pain.   Marland Kitchen albuterol (PROAIR HFA) 108 (90 Base) MCG/ACT inhaler Inhale 2 puffs into the lungs every 6 (six) hours as needed for wheezing or shortness of breath.  Marland Kitchen atorvastatin (LIPITOR) 10 MG tablet Take 5 mg by mouth daily.   . benzonatate (TESSALON) 100 MG capsule Take 1 capsule (100 mg total) by mouth 3 (three) times daily as needed for cough.  . bimatoprost (LUMIGAN) 0.01 % SOLN Place 1 drop into both eyes at bedtime.  . cholecalciferol (VITAMIN D) 1000 UNITS tablet Take 1,000 Units by mouth 2 (two) times daily.   Marland Kitchen diltiazem (CARDIZEM CD) 360 MG 24 hr capsule Take 1 capsule (360 mg total) by mouth daily.  Marland Kitchen  ELIQUIS 2.5 MG TABS tablet Take 2.5 mg by mouth 2 (two) times daily.  . feeding supplement, ENSURE ENLIVE, (ENSURE ENLIVE) LIQD Take 237 mLs by mouth 2 (two) times daily between meals.  . furosemide (LASIX) 20 MG tablet Take 1 tablet (20 mg total) by mouth daily.  . OXYGEN Inhale 2 L into the lungs daily.  . potassium chloride (K-DUR,KLOR-CON) 10 MEQ tablet Take 10 mEq by mouth 2 (two) times daily.  . predniSONE (DELTASONE) 10 MG tablet TAKE 3 TABS/DAY X4DAYS, THEN 2 TABS/DAY X5DAYS, THEN 1 TAB/DAY X5 DAYS,  . tetrahydrozoline 0.05 % ophthalmic solution Place 1 drop into both eyes 4 (four) times daily as needed (For dry eyes.).  Marland Kitchen traMADol-acetaminophen (ULTRACET) 37.5-325 MG per tablet Take 1-2 tablets by mouth 2 (two) times daily as needed for moderate pain.   Marland Kitchen umeclidinium bromide (INCRUSE ELLIPTA) 62.5  MCG/INH AEPB Inhale 1 puff into the lungs daily.  Marland Kitchen umeclidinium-vilanterol (ANORO ELLIPTA) 62.5-25 MCG/INH AEPB Inhale 1 puff into the lungs daily.  . vitamin E 1000 UNIT capsule Take 1,000 Units by mouth 2 (two) times daily.   . [DISCONTINUED] umeclidinium bromide (INCRUSE ELLIPTA) 62.5 MCG/INH AEPB Inhale 1 puff into the lungs daily.  . [DISCONTINUED] umeclidinium-vilanterol (ANORO ELLIPTA) 62.5-25 MCG/INH AEPB Inhale 1 puff into the lungs daily.  Marland Kitchen albuterol (PROVENTIL) (2.5 MG/3ML) 0.083% nebulizer solution Take 3 mLs (2.5 mg total) by nebulization every 6 (six) hours as needed for wheezing or shortness of breath. DX: J44.9 (Patient not taking: Reported on 05/12/2017)   No facility-administered encounter medications on file as of 05/12/2017.      Review of Systems  Constitutional:   No  weight loss, night sweats,  Fevers, chills,  +fatigue, or  lassitude.  HEENT:   No headaches,  Difficulty swallowing,  Tooth/dental problems, or  Sore throat,                No sneezing, itching, ear ache, nasal congestion, post nasal drip,   CV:  No chest pain,  Orthopnea, PND, swelling in lower extremities, anasarca, dizziness, palpitations, syncope.   GI  No heartburn, indigestion, abdominal pain, nausea, vomiting, diarrhea, change in bowel habits, loss of appetite, bloody stools.   Resp:    No chest wall deformity  Skin: no rash or lesions.  GU: no dysuria, change in color of urine, no urgency or frequency.  No flank pain, no hematuria   MS:  No joint pain or swelling.  No decreased range of motion.  No back pain.    Physical Exam  BP 106/72 (BP Location: Right Arm, Cuff Size: Normal)   Pulse 85   Ht 5\' 3"  (1.6 m)   Wt 111 lb 9.6 oz (50.6 kg)   SpO2 92%   BMI 19.77 kg/m   GEN: A/Ox3; pleasant , NAD, thin and frail on O2    HEENT:  [place]/AT,  EACs-clear, TMs-wnl, NOSE-clear, THROAT-clear, no lesions, no postnasal drip or exudate noted.   NECK:  Supple w/ fair ROM; no JVD; normal carotid  impulses w/o bruits; no thyromegaly or nodules palpated; no lymphadenopathy.    RESP  Clear  P & A; w/o, wheezes/ rales/ or rhonchi. no accessory muscle use, no dullness to percussion  CARD:  RRR, no m/r/g, no peripheral edema, pulses intact, no cyanosis or clubbing.  GI:   Soft & nt; nml bowel sounds; no organomegaly or masses detected.   Musco: Warm bil, no deformities or joint swelling noted.   Neuro: alert, no focal  deficits noted.    Skin: Warm, no lesions or rashes    Lab Results:  CBC  BNP  ProBNP No results found for: PROBNP  Imaging: No results found.   Assessment & Plan:   COPD (chronic obstructive pulmonary disease) (HCC) Cont on current regimen dong well   Plan  Patient Instructions  Continue on INCRUSE  1 puff daily. , rinse after use.  Continue on Oxygen 2l/m .  Advance activity as tolerated.  Follow up with Dr. Craige CottaSood  In 4 months and As needed         Chronic respiratory failure with hypoxia (HCC) Cont on o2      Jayant Kriz, NP 05/12/2017

## 2017-05-12 NOTE — Patient Instructions (Addendum)
Continue on INCRUSE  1 puff daily. , rinse after use.  Continue on Oxygen 2l/m .  Advance activity as tolerated.  Follow up with Dr. Craige CottaSood  In 4 months and As needed

## 2017-05-12 NOTE — Progress Notes (Signed)
84 y/o F with PMH of COPD-emphysema, CHF, atrial flutter on eliquis, and home oxygen 2L [place] 24/7.  Presents today for a follow up appointment of her COPD.  States she has been feeling well overall since her last ov on 01/2017.  Endorses baseline SOB with activities of daily living like dressing and making the bed.  States fatigues easily, fair appetite, and also has traces of bilateral lower extremities edema.  Denies resting SOB, fever, chills, weight changes, cough, wheezing, chest pain or edema.   Uses albuterol rescue inhaler 2 to 3 times a day for exertional SOB. Uses her Incruse inhaler as prescribed.  Completed home PT and now does the recommended exercises sparingly.  Requests no refills today.  PE Gen: Pleasant, in no distress, thin and frail on oxygen HEENT: Within normal limits Neck: Supple, No JVD Lymphatics: No cervical lymphadenopathy Cardiovascular: Regular rate and rhythm, no murmurs, trace of edema to bilateral lower extremetits Pulmonary: CTA, no wheezing or rales, no accessory muscle use  Skin: Warm and dry, no lesions or rashes Neurological: A&0x3

## 2017-05-12 NOTE — Assessment & Plan Note (Signed)
Cont on o2 .  

## 2017-05-12 NOTE — Assessment & Plan Note (Signed)
Cont on current regimen dong well   Plan  Patient Instructions  Continue on INCRUSE  1 puff daily. , rinse after use.  Continue on Oxygen 2l/m .  Advance activity as tolerated.  Follow up with Dr. Craige CottaSood  In 4 months and As needed

## 2017-05-13 NOTE — Progress Notes (Signed)
Reviewed and agree with assessment/plan.   Joellen Tullos, MD [facility] 04/02/2016, 12:24 PM Pager:  [phone]  

## 2017-05-13 NOTE — Progress Notes (Signed)
Reviewed and agree with assessment/plan.   Corbet Hanley, MD  Pulmonary/Critical Care 04/02/2016, 12:24 PM Pager:  [phone]  

## 2017-07-05 ENCOUNTER — Other Ambulatory Visit: Payer: Self-pay | Admitting: Pulmonary Disease

## 2017-08-09 NOTE — Progress Notes (Deleted)
Cardiology Office Note   Date:  08/09/2017   ID:  [name], DOB 11-25-1927, MRN [id number]  PCP:  Coralyn Helling, MD  Cardiologist:   Moussa Wiegand [place], MD   No chief complaint on file.     History of Present Illness: [name] is a 84 y.o. female who presents for  follow up of atrial fibrillation. She has a long history of paroxysmal atrial fibrillation/flutter managed with rate control with Cardizem. She is on Eliquis.  Did take Xarelto in the past but had some increased bleeding on this. Has tolerated Eliquis well without bleeding. She has a history of CKD, PAD (50% carotid disease) and diastolic CHF. She also has COPD.  She was admitted to [facility] in January 2017 with PNA and the flu. Was initially in NSR but went into AFib in hospital. Rate controlled. She was discharged to SNF for one month. Required diuretics for edema but was discharged home on no diuretic. She was readmitted in April 2018 with increased weakness and SOB. She was in atrial flutter with some increased VR. Rate controlled with Cardizem. She did convert to NSR. She was treated with IV lasix with some exacerbation of her CKD. Maintained on Eliquis 2.5 mg bid. ASA stopped. Remained on home oxygen. Not felt to be a candidate for AAD therapy due to severe COPD and CKD. Echo showed normal LV and valvular function. She was also treated with steroids and nebulizers.   She was readmitted in early June with COPD exacerbation due to RLL PNA. Treated with oxygen, steroids and antibiotics. Was in AFlutter with controlled rate.   She was admitted again in early October 2018 with respiratory failure with hypoxia. She was initially in Afib with controlled rate but converted to NSR. Repeat Echo showed no change. She experienced an episode of near syncope and outpatient event monitor planned but apparently never ordered.   Since her last visit here her husband Shon Baton passed away. She is now living      Past Medical History:    Diagnosis Date  . Anticoagulated    Eliquis  . Atrial flutter (HCC)    NO CARDIOLOGIST FOLLOWED BY DR Tori Milks  . Bloody stools    LAST 4 DAYS  . CHF (congestive heart failure) (HCC)   . Chronic back pain   . CKD (chronic kidney disease), stage III (HCC)    Creatinine 1.35 -1.55  . COPD (chronic obstructive pulmonary disease) (HCC)   . Diverticulosis   . Dyspnea   . Dysrhythmia   . Edema   . Gait disorder   . Glaucoma    Severe  . Glaucoma    both eyes  . High cholesterol   . Multinodular goiter   . PAOD (peripheral arterial occlusive disease) (HCC)    Stein in right groin, decrease pulses  . PMR (polymyalgia rheumatica) (HCC)   . Pneumonia, organism unspecified(486) 04/18/2016  . Protein-calorie malnutrition (HCC)   . Rash of back   . Weakness     Past Surgical History:  Procedure Laterality Date  . COLONOSCOPY N/A 07/15/2013   Procedure: COLONOSCOPY;  Surgeon: Louis Meckel, MD;  Location: [facility] ENDOSCOPY;  Service: Endoscopy;  Laterality: N/A;  . ESOPHAGOGASTRODUODENOSCOPY N/A 07/08/2013   Procedure: ESOPHAGOGASTRODUODENOSCOPY (EGD);  Surgeon: Louis Meckel, MD;  Location: [facility];  Service: Endoscopy;  Laterality: N/A;  Per Connye Burkitt, will try to have CRNA available, but will not guaranteee. Ok to schedule per Clydie Braun 07/06/13  . GALLBLADDER  SURGERY    . IR GENERIC HISTORICAL  05/18/2014   IR RADIOLOGIST EVAL & MGMT 05/18/2014 Berdine Dance, MD [facility] INTERV RAD  . LAMINECTOMY    . leg stent Right   . SHOULDER SURGERY     frozen shoulder/ Bil shoulders     Current Outpatient Medications  Medication Sig Dispense Refill  . acetaminophen (TYLENOL) 500 MG tablet Take 500-1,000 mg by mouth every 4 (four) hours as needed for mild pain.     Marland Kitchen albuterol (PROAIR HFA) 108 (90 Base) MCG/ACT inhaler Inhale 2 puffs into the lungs every 6 (six) hours as needed for wheezing or shortness of breath. 1 Inhaler 3  . albuterol (PROVENTIL) (2.5 MG/3ML) 0.083% nebulizer  solution Take 3 mLs (2.5 mg total) by nebulization every 6 (six) hours as needed for wheezing or shortness of breath. DX: J44.9 (Patient not taking: Reported on 05/12/2017) 75 mL 5  . atorvastatin (LIPITOR) 10 MG tablet Take 5 mg by mouth daily.     . benzonatate (TESSALON) 100 MG capsule Take 1 capsule (100 mg total) by mouth 3 (three) times daily as needed for cough. 20 capsule 0  . bimatoprost (LUMIGAN) 0.01 % SOLN Place 1 drop into both eyes at bedtime.    . cholecalciferol (VITAMIN D) 1000 UNITS tablet Take 1,000 Units by mouth 2 (two) times daily.     Marland Kitchen diltiazem (CARDIZEM CD) 360 MG 24 hr capsule Take 1 capsule (360 mg total) by mouth daily. 30 capsule 0  . ELIQUIS 2.5 MG TABS tablet Take 2.5 mg by mouth 2 (two) times daily.    . feeding supplement, ENSURE ENLIVE, (ENSURE ENLIVE) LIQD Take 237 mLs by mouth 2 (two) times daily between meals. 60 Bottle 0  . furosemide (LASIX) 20 MG tablet Take 1 tablet (20 mg total) by mouth daily. 30 tablet 0  . INCRUSE ELLIPTA 62.5 MCG/INH AEPB INHALE 1 PUFF ONCE DAILY 30 each 3  . OXYGEN Inhale 2 L into the lungs daily.    . potassium chloride (K-DUR,KLOR-CON) 10 MEQ tablet Take 10 mEq by mouth 2 (two) times daily.    . predniSONE (DELTASONE) 10 MG tablet TAKE 3 TABS/DAY X4DAYS, THEN 2 TABS/DAY X5DAYS, THEN 1 TAB/DAY X5 DAYS, 27 tablet 0  . tetrahydrozoline 0.05 % ophthalmic solution Place 1 drop into both eyes 4 (four) times daily as needed (For dry eyes.).    Marland Kitchen traMADol-acetaminophen (ULTRACET) 37.5-325 MG per tablet Take 1-2 tablets by mouth 2 (two) times daily as needed for moderate pain.     Marland Kitchen umeclidinium-vilanterol (ANORO ELLIPTA) 62.5-25 MCG/INH AEPB Inhale 1 puff into the lungs daily. 1 each 5  . vitamin E 1000 UNIT capsule Take 1,000 Units by mouth 2 (two) times daily.      No current facility-administered medications for this visit.     Allergies:   Codeine sulfate    Social History:  The patient  reports that she quit smoking about 24 years  ago. Her smoking use included cigarettes. She has a 15.00 pack-year smoking history. She has never used smokeless tobacco. She reports that she does not drink alcohol or use drugs.   Family History:  The patient's family history includes Cirrhosis in her maternal aunt; Diabetes in her maternal uncle; Throat cancer in her maternal grandfather and mother.    ROS:  Please see the history of present illness.   Otherwise, review of systems are positive for none.   All other systems are reviewed and negative.    PHYSICAL  EXAM: VS:  There were no vitals taken for this visit. , BMI There is no height or weight on file to calculate BMI. Repeat oxygen sat 90% after rest.  GENERAL:  Well appearing WF in NAD HEENT:  PERRL, EOMI, sclera are clear. Oropharynx is clear. NECK:  No jugular venous distention, carotid upstroke brisk and symmetric, no bruits, no thyromegaly or adenopathy LUNGS:  Clear to auscultation bilaterally CHEST:  Unremarkable HEART:  RRR,  PMI not displaced or sustained,S1 and S2 within normal limits, no S3, no S4: no clicks, no rubs, no murmurs ABD:  Soft, nontender. BS +, no masses or bruits. No hepatomegaly, no splenomegaly EXT:  2 + pulses throughout, no edema, no cyanosis no clubbing SKIN:  Warm and dry.  No rashes NEURO:  Alert and oriented x 3. Cranial nerves II through XII intact. PSYCH:  Cognitively intact     EKG:  EKG is not ordered today. The ekg ordered today demonstrates N/A   Recent Labs: 01/12/2017: B Natriuretic Peptide 193.4; Hemoglobin 15.6; Platelets 353 01/13/2017: Magnesium 2.3 01/14/2017: BUN 52; Creatinine, Ser 1.89; Potassium 3.8; Sodium 139    Lipid Panel No results found for: CHOL, TRIG, HDL, CHOLHDL, VLDL, LDLCALC, LDLDIRECT    Labs dated 04/03/17: cholesterol 166, triglycerides 165, HDL 61, LDL 79. CBC and chemistries normal.   Wt Readings from Last 3 Encounters:  05/12/17 111 lb 9.6 oz (50.6 kg)  03/02/17 113 lb (51.3 kg)  01/30/17 112 lb  (50.8 kg)      Other studies Reviewed: Echo: 07/30/16: Study Conclusions  - Left ventricle: The cavity size was normal. Wall thickness was   normal. Indeterminant diastolic function (atrial flutter).   Systolic function was normal. The estimated ejection fraction was   in the range of 55% to 60%. Wall motion was normal; there were no   regional wall motion abnormalities. - Aortic valve: There was no stenosis. - Mitral valve: There was trivial regurgitation. - Right ventricle: The cavity size was normal. Systolic function   was mildly reduced. - Tricuspid valve: Peak RV-RA gradient (S): 31 mm Hg. - Pulmonary arteries: PA peak pressure: 34 mm Hg (S). - Inferior vena cava: The vessel was normal in size. The   respirophasic diameter changes were in the normal range (>= 50%),   consistent with normal central venous pressure.  Impressions:  - The patient was in atrial flutter. Normal LV size with EF 55-60%.   Normal RV size with mildly decreased systolic function. No   significant valvular abnormalities.   Echo 01/13/17: Study Conclusions  - Left ventricle: The cavity size was normal. Systolic function was   normal. The estimated ejection fraction was in the range of 60%   to 65%. Wall motion was normal; there were no regional wall   motion abnormalities. Features are consistent with a pseudonormal   left ventricular filling pattern, with concomitant abnormal   relaxation and increased filling pressure (grade 2 diastolic   dysfunction). - Aortic valve: Trileaflet; mildly thickened, mildly calcified   leaflets. - Mitral valve: There was mild regurgitation. - Tricuspid valve: There was mild regurgitation. - Pulmonic valve: Cusp separation was normal. - Pulmonary arteries: Systolic pressure could not be accurately   estimated.   ASSESSMENT AND PLAN:  1.  Atrial fibrillation/flutter. Pulse is regular today. She is asymptomatic. Continue Cardizem and Eliquis. Eliquis dose  reduced due to age and renal dysfunction. 2. Chronic diastolic CHF- appears well compensated today. Continue lasix 20 mg daily. Sodium restriction 3. CKD  stage 3-4.  4. Severe COPD on home oxygen. Followed by pulmonary.   Current medicines are reviewed at length with the patient today.  The patient does not have concerns regarding medicines.  The following changes have been made:  no change  Labs/ tests ordered today include:  No orders of the defined types were placed in this encounter.    Disposition:   FU with me in 6 months  Signed, Frandy Basnett [place], MD  08/09/2017 8:13 PM    [facility] [address] [place], [place], [address] Phone [phone], Fax [phone]

## 2017-08-12 ENCOUNTER — Ambulatory Visit: Payer: Medicare Other | Admitting: Cardiology

## 2017-08-23 IMAGING — DX DG CHEST 2V
2 series · 2 of 2 positions shown · non-contrast
Comparison: 04/17/2016

CLINICAL DATA: Community acquired pneumonia right upper lobe

EXAM:
CHEST  2 VIEW

[chest pa]
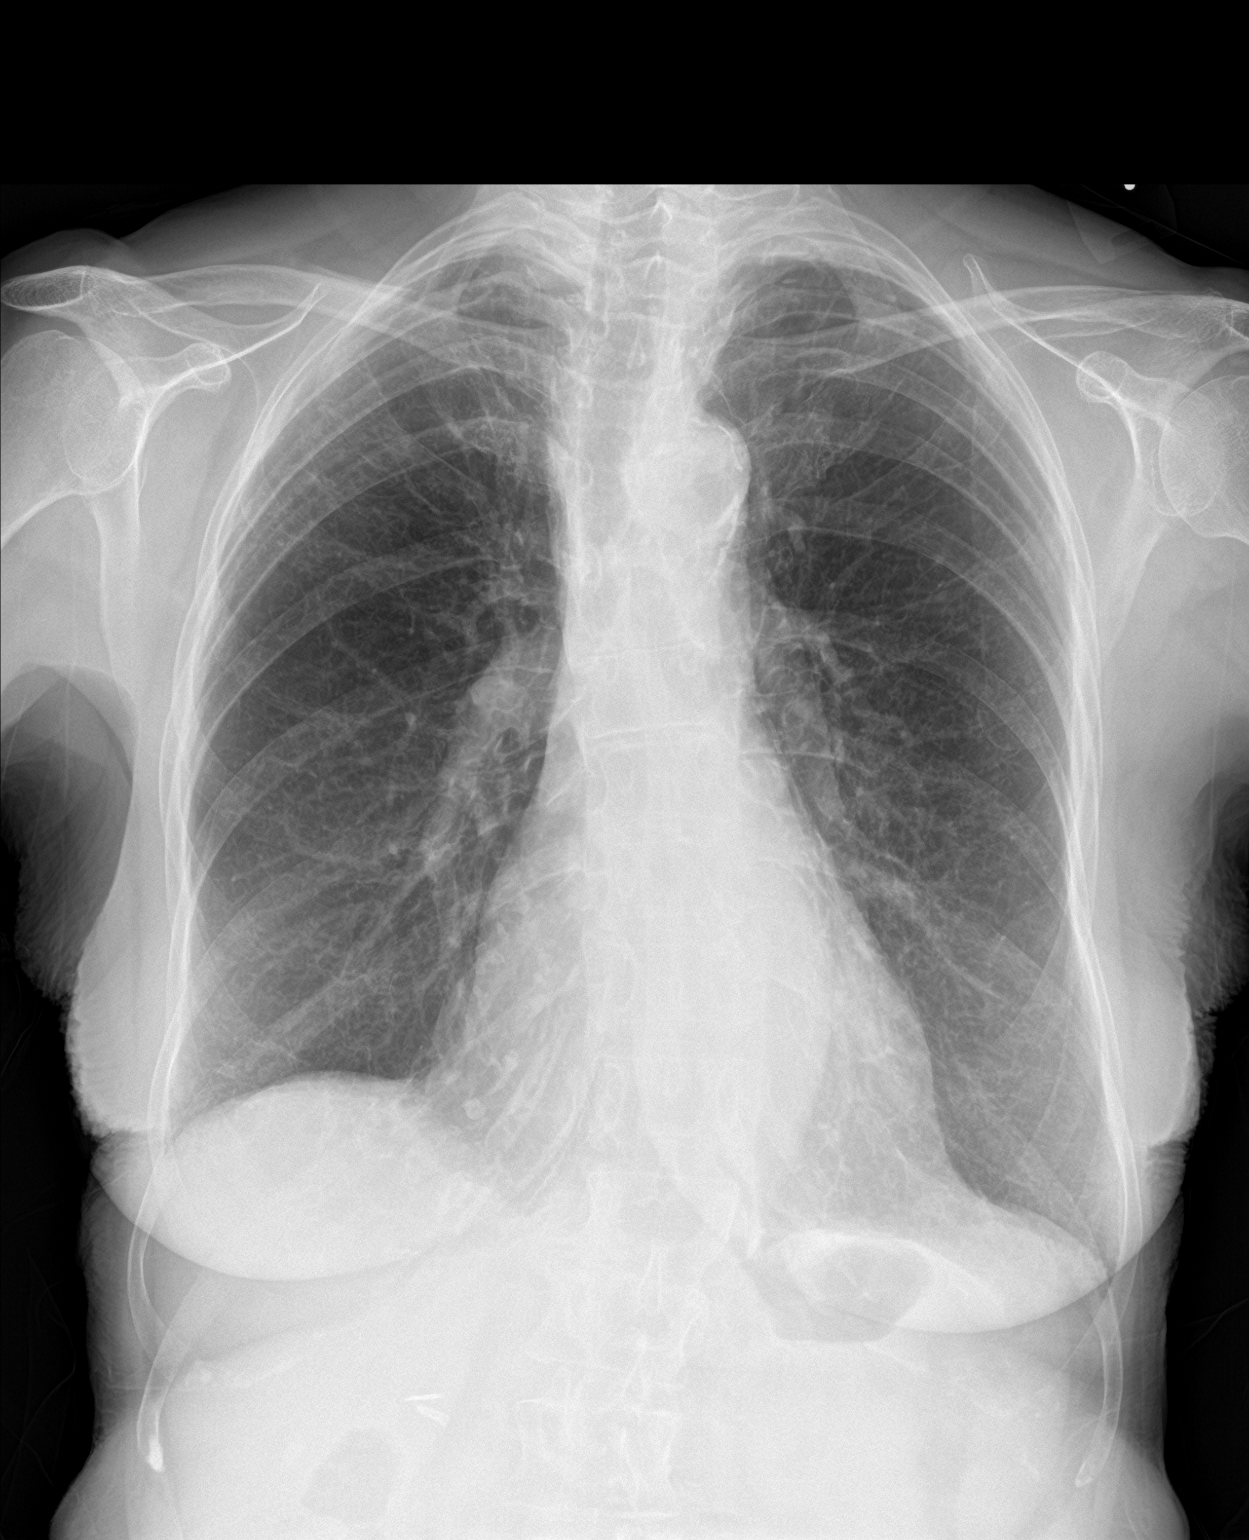

[chest lat]
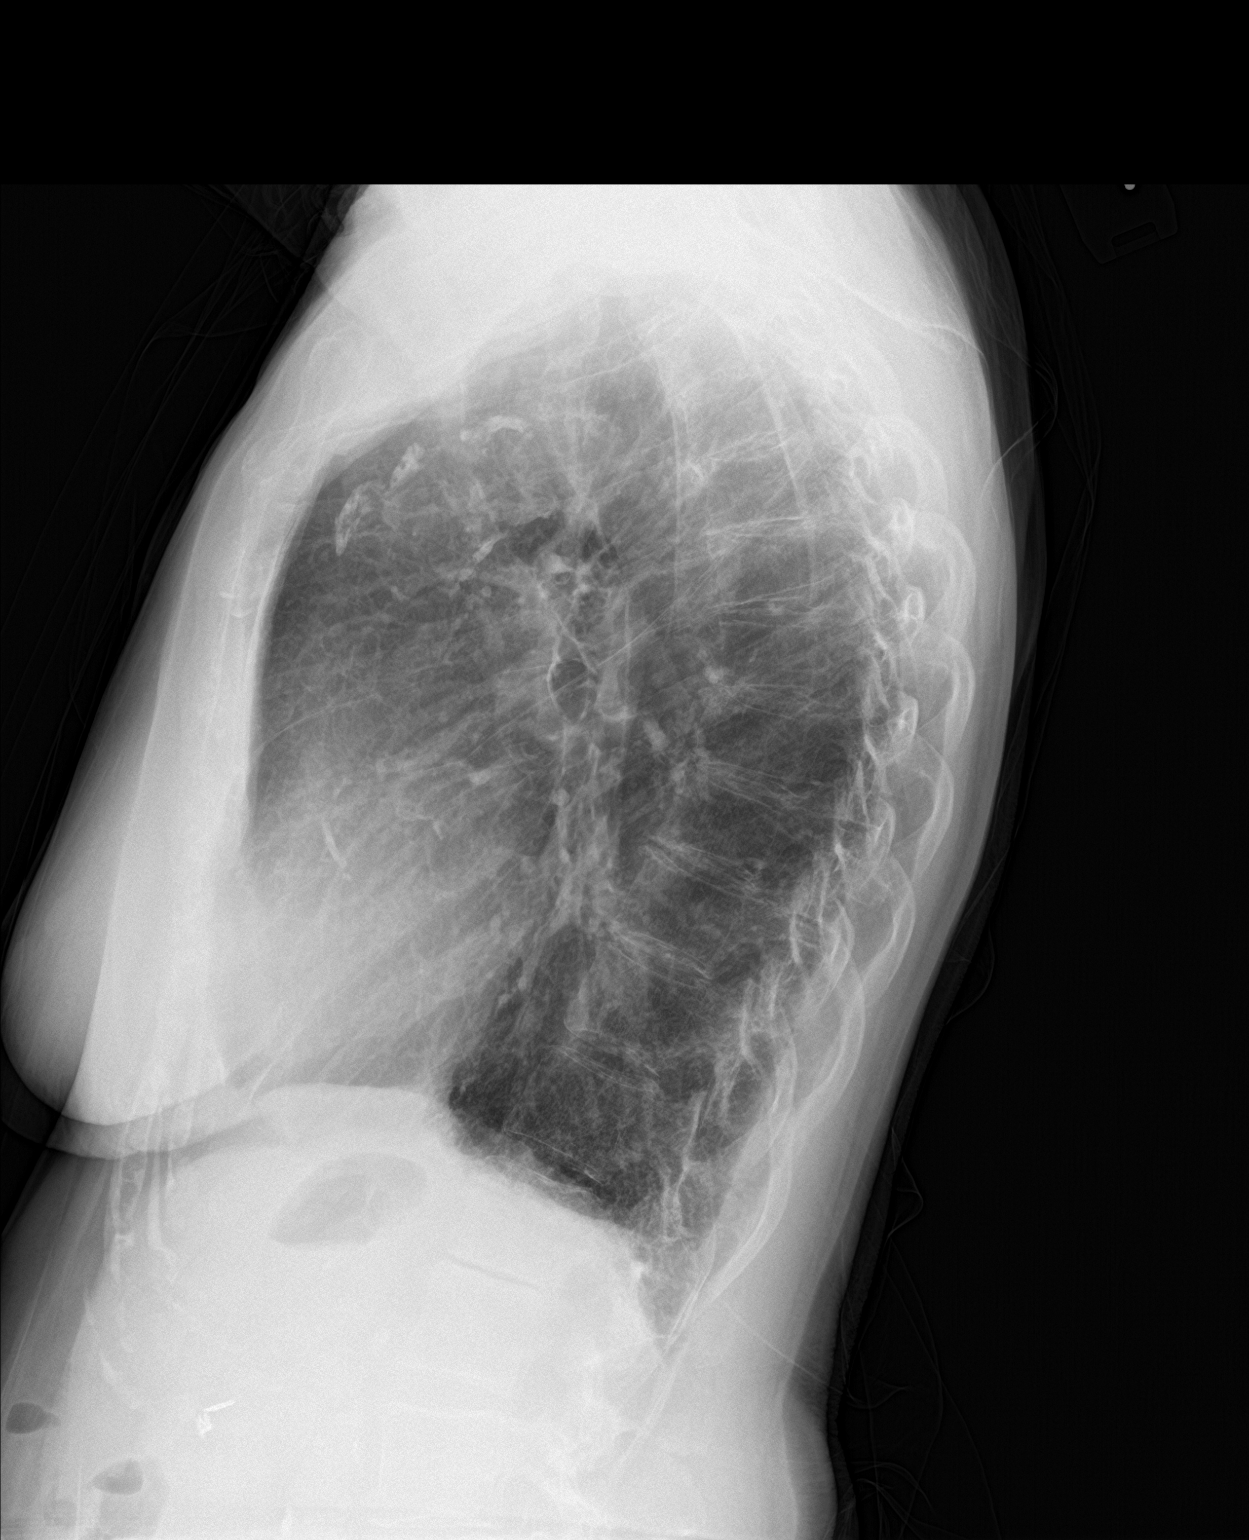

[2 of 2 positions shown; findings below may reference images not displayed]

FINDINGS: COPD with pulmonary hyperinflation. Right upper lobe infiltrate has
cleared without significant residual infiltrate. No underlying mass.

Interval clearing of bibasilar airspace disease and bilateral
effusions. Lung bases now clear

Atherosclerotic calcification aortic arch. Negative for heart
failure. Apical scarring bilaterally.
IMPRESSION: Right upper lobe pneumonia has cleared

Interval clearing of bibasilar airspace disease and bilateral
effusions.

## 2017-08-24 ENCOUNTER — Other Ambulatory Visit: Payer: Self-pay

## 2017-08-24 ENCOUNTER — Encounter (HOSPITAL_COMMUNITY): Payer: Self-pay

## 2017-08-24 ENCOUNTER — Inpatient Hospital Stay (HOSPITAL_COMMUNITY)
Admission: EM | Admit: 2017-08-24 | Discharge: 2017-08-29 | DRG: 190 | Disposition: A | Discharge status: Skilled Nursing Facility | Payer: Medicare Other | Attending: Family Medicine | Admitting: Family Medicine

## 2017-08-24 ENCOUNTER — Emergency Department (HOSPITAL_COMMUNITY): Payer: Medicare Other

## 2017-08-24 DIAGNOSIS — I130 Hypertensive heart and chronic kidney disease with heart failure and stage 1 through stage 4 chronic kidney disease, or unspecified chronic kidney disease: Secondary | ICD-10-CM | POA: Diagnosis present

## 2017-08-24 DIAGNOSIS — M549 Dorsalgia, unspecified: Secondary | ICD-10-CM | POA: Diagnosis present

## 2017-08-24 DIAGNOSIS — E876 Hypokalemia: Secondary | ICD-10-CM | POA: Diagnosis present

## 2017-08-24 DIAGNOSIS — Y92230 Patient room in hospital as the place of occurrence of the external cause: Secondary | ICD-10-CM | POA: Diagnosis not present

## 2017-08-24 DIAGNOSIS — M353 Polymyalgia rheumatica: Secondary | ICD-10-CM | POA: Diagnosis present

## 2017-08-24 DIAGNOSIS — J9621 Acute and chronic respiratory failure with hypoxia: Secondary | ICD-10-CM | POA: Diagnosis present

## 2017-08-24 DIAGNOSIS — E785 Hyperlipidemia, unspecified: Secondary | ICD-10-CM | POA: Diagnosis present

## 2017-08-24 DIAGNOSIS — J441 Chronic obstructive pulmonary disease with (acute) exacerbation: Principal | ICD-10-CM | POA: Diagnosis present

## 2017-08-24 DIAGNOSIS — I361 Nonrheumatic tricuspid (valve) insufficiency: Secondary | ICD-10-CM | POA: Diagnosis not present

## 2017-08-24 DIAGNOSIS — R1110 Vomiting, unspecified: Secondary | ICD-10-CM | POA: Diagnosis present

## 2017-08-24 DIAGNOSIS — Z885 Allergy status to narcotic agent status: Secondary | ICD-10-CM

## 2017-08-24 DIAGNOSIS — I10 Essential (primary) hypertension: Secondary | ICD-10-CM | POA: Diagnosis present

## 2017-08-24 DIAGNOSIS — E440 Moderate protein-calorie malnutrition: Secondary | ICD-10-CM | POA: Diagnosis present

## 2017-08-24 DIAGNOSIS — E7800 Pure hypercholesterolemia, unspecified: Secondary | ICD-10-CM | POA: Diagnosis present

## 2017-08-24 DIAGNOSIS — Z833 Family history of diabetes mellitus: Secondary | ICD-10-CM

## 2017-08-24 DIAGNOSIS — G8929 Other chronic pain: Secondary | ICD-10-CM | POA: Diagnosis present

## 2017-08-24 DIAGNOSIS — Z681 Body mass index (BMI) 19 or less, adult: Secondary | ICD-10-CM | POA: Diagnosis not present

## 2017-08-24 DIAGNOSIS — I5032 Chronic diastolic (congestive) heart failure: Secondary | ICD-10-CM | POA: Diagnosis not present

## 2017-08-24 DIAGNOSIS — J9601 Acute respiratory failure with hypoxia: Secondary | ICD-10-CM | POA: Diagnosis not present

## 2017-08-24 DIAGNOSIS — N183 Chronic kidney disease, stage 3 unspecified: Secondary | ICD-10-CM | POA: Diagnosis present

## 2017-08-24 DIAGNOSIS — Z9981 Dependence on supplemental oxygen: Secondary | ICD-10-CM

## 2017-08-24 DIAGNOSIS — Z7901 Long term (current) use of anticoagulants: Secondary | ICD-10-CM | POA: Diagnosis not present

## 2017-08-24 DIAGNOSIS — Z79899 Other long term (current) drug therapy: Secondary | ICD-10-CM

## 2017-08-24 DIAGNOSIS — Z808 Family history of malignant neoplasm of other organs or systems: Secondary | ICD-10-CM

## 2017-08-24 DIAGNOSIS — H409 Unspecified glaucoma: Secondary | ICD-10-CM | POA: Diagnosis present

## 2017-08-24 DIAGNOSIS — J9620 Acute and chronic respiratory failure, unspecified whether with hypoxia or hypercapnia: Secondary | ICD-10-CM | POA: Diagnosis present

## 2017-08-24 DIAGNOSIS — I480 Paroxysmal atrial fibrillation: Secondary | ICD-10-CM | POA: Diagnosis not present

## 2017-08-24 DIAGNOSIS — I5042 Chronic combined systolic (congestive) and diastolic (congestive) heart failure: Secondary | ICD-10-CM | POA: Diagnosis present

## 2017-08-24 DIAGNOSIS — I4892 Unspecified atrial flutter: Secondary | ICD-10-CM | POA: Diagnosis present

## 2017-08-24 DIAGNOSIS — J9611 Chronic respiratory failure with hypoxia: Secondary | ICD-10-CM | POA: Diagnosis present

## 2017-08-24 DIAGNOSIS — Z66 Do not resuscitate: Secondary | ICD-10-CM | POA: Diagnosis present

## 2017-08-24 DIAGNOSIS — R197 Diarrhea, unspecified: Secondary | ICD-10-CM | POA: Diagnosis present

## 2017-08-24 DIAGNOSIS — G4733 Obstructive sleep apnea (adult) (pediatric): Secondary | ICD-10-CM | POA: Diagnosis present

## 2017-08-24 DIAGNOSIS — I509 Heart failure, unspecified: Secondary | ICD-10-CM

## 2017-08-24 DIAGNOSIS — Z87891 Personal history of nicotine dependence: Secondary | ICD-10-CM

## 2017-08-24 DIAGNOSIS — T380X5A Adverse effect of glucocorticoids and synthetic analogues, initial encounter: Secondary | ICD-10-CM | POA: Diagnosis not present

## 2017-08-24 DIAGNOSIS — J449 Chronic obstructive pulmonary disease, unspecified: Secondary | ICD-10-CM | POA: Diagnosis present

## 2017-08-24 DIAGNOSIS — I739 Peripheral vascular disease, unspecified: Secondary | ICD-10-CM | POA: Diagnosis present

## 2017-08-24 LAB — CBC WITH DIFFERENTIAL/PLATELET
Basophils Absolute: 0 10*3/uL (ref 0.0–0.1)
Basophils Relative: 0 %
EOS PCT: 2 %
Eosinophils Absolute: 0.2 10*3/uL (ref 0.0–0.7)
HEMATOCRIT: 45 % (ref 36.0–46.0)
HEMOGLOBIN: 15.2 g/dL — AB (ref 12.0–15.0)
LYMPHS ABS: 0.5 10*3/uL — AB (ref 0.7–4.0)
LYMPHS PCT: 5 %
MCH: 30.8 pg (ref 26.0–34.0)
MCHC: 33.8 g/dL (ref 30.0–36.0)
MCV: 91.3 fL (ref 78.0–100.0)
Monocytes Absolute: 1.3 10*3/uL — ABNORMAL HIGH (ref 0.1–1.0)
Monocytes Relative: 13 %
NEUTROS PCT: 80 %
Neutro Abs: 8.6 10*3/uL — ABNORMAL HIGH (ref 1.7–7.7)
Platelets: 417 10*3/uL — ABNORMAL HIGH (ref 150–400)
RBC: 4.93 MIL/uL (ref 3.87–5.11)
RDW: 14 % (ref 11.5–15.5)
WBC: 10.7 10*3/uL — AB (ref 4.0–10.5)

## 2017-08-24 LAB — URINALYSIS, ROUTINE W REFLEX MICROSCOPIC
BACTERIA UA: NONE SEEN
Bilirubin Urine: NEGATIVE
GLUCOSE, UA: NEGATIVE mg/dL
KETONES UR: NEGATIVE mg/dL
NITRITE: NEGATIVE
PROTEIN: NEGATIVE mg/dL
Specific Gravity, Urine: 1.01 (ref 1.005–1.030)
pH: 5 (ref 5.0–8.0)

## 2017-08-24 LAB — COMPREHENSIVE METABOLIC PANEL
ALBUMIN: 3.4 g/dL — AB (ref 3.5–5.0)
ALK PHOS: 80 U/L (ref 38–126)
ALT: 25 U/L (ref 14–54)
ANION GAP: 16 — AB (ref 5–15)
AST: 25 U/L (ref 15–41)
BUN: 27 mg/dL — ABNORMAL HIGH (ref 6–20)
CALCIUM: 9.4 mg/dL (ref 8.9–10.3)
CO2: 25 mmol/L (ref 22–32)
Chloride: 98 mmol/L — ABNORMAL LOW (ref 101–111)
Creatinine, Ser: 1.95 mg/dL — ABNORMAL HIGH (ref 0.44–1.00)
GFR calc Af Amer: 25 mL/min — ABNORMAL LOW (ref >60)
GFR calc non Af Amer: 22 mL/min — ABNORMAL LOW (ref >60)
GLUCOSE: 132 mg/dL — AB (ref 65–99)
Potassium: 3.3 mmol/L — ABNORMAL LOW (ref 3.5–5.1)
SODIUM: 139 mmol/L (ref 135–145)
Total Bilirubin: 0.8 mg/dL (ref 0.3–1.2)
Total Protein: 8 g/dL (ref 6.5–8.1)

## 2017-08-24 LAB — BLOOD GAS, ARTERIAL
Acid-Base Excess: 1.4 mmol/L (ref 0.0–2.0)
BICARBONATE: 22.3 mmol/L (ref 20.0–28.0)
DRAWN BY: 257701
Expiratory PAP: 6
FIO2: 100
Inspiratory PAP: 12
O2 Saturation: 99.7 %
Patient temperature: 98.6
pCO2 arterial: 25.9 mmHg — ABNORMAL LOW (ref 32.0–48.0)
pH, Arterial: 7.544 — ABNORMAL HIGH (ref 7.350–7.450)
pO2, Arterial: 480 mmHg — ABNORMAL HIGH (ref 83.0–108.0)

## 2017-08-24 LAB — PROTIME-INR
INR: 1.42
Prothrombin Time: 17.2 seconds — ABNORMAL HIGH (ref 11.4–15.2)

## 2017-08-24 LAB — MRSA PCR SCREENING: MRSA by PCR: NEGATIVE

## 2017-08-24 LAB — I-STAT CG4 LACTIC ACID, ED
Lactic Acid, Venous: 2.39 mmol/L (ref 0.5–1.9)
Lactic Acid, Venous: 1.98 mmol/L — ABNORMAL HIGH (ref 0.5–1.9)

## 2017-08-24 LAB — BRAIN NATRIURETIC PEPTIDE: B Natriuretic Peptide: 789.5 pg/mL — ABNORMAL HIGH (ref 0.0–100.0)

## 2017-08-24 MED ORDER — APIXABAN 2.5 MG PO TABS
2.5000 mg | ORAL_TABLET | Freq: Two times a day (BID) | ORAL | Status: DC
  Administered 2017-08-24 – 2017-08-29 (×10): 2.5 mg via ORAL
  Filled 2017-08-24 (×10): qty 1

## 2017-08-24 MED ORDER — SODIUM CHLORIDE 0.9 % IV SOLN
500.0000 mg | Freq: Once | INTRAVENOUS | Status: AC
  Administered 2017-08-24: 500 mg via INTRAVENOUS
  Filled 2017-08-24: qty 500

## 2017-08-24 MED ORDER — LORAZEPAM 2 MG/ML IJ SOLN
0.5000 mg | Freq: Once | INTRAMUSCULAR | Status: AC
  Administered 2017-08-24: 0.5 mg via INTRAVENOUS

## 2017-08-24 MED ORDER — ACETAMINOPHEN 650 MG RE SUPP: 650.0000 mg | Freq: Four times a day (QID) | RECTAL | Status: DC | PRN

## 2017-08-24 MED ORDER — ATORVASTATIN CALCIUM 10 MG PO TABS
5.0000 mg | ORAL_TABLET | Freq: Every day | ORAL | Status: DC
  Administered 2017-08-25 – 2017-08-29 (×5): 5 mg via ORAL
  Filled 2017-08-24 (×5): qty 1

## 2017-08-24 MED ORDER — POLYETHYLENE GLYCOL 3350 17 G PO PACK: 17.0000 g | PACK | Freq: Every day | ORAL | Status: DC | PRN

## 2017-08-24 MED ORDER — PREDNISONE 20 MG PO TABS
40.0000 mg | ORAL_TABLET | Freq: Every day | ORAL | Status: DC
  Administered 2017-08-25 – 2017-08-26 (×2): 40 mg via ORAL
  Filled 2017-08-24 (×3): qty 2

## 2017-08-24 MED ORDER — LORAZEPAM 2 MG/ML IJ SOLN
INTRAMUSCULAR | Status: AC
  Filled 2017-08-24: qty 1

## 2017-08-24 MED ORDER — VITAMIN E 45 MG (100 UNIT) PO CAPS
1000.0000 [IU] | ORAL_CAPSULE | Freq: Two times a day (BID) | ORAL | Status: DC
  Administered 2017-08-24 – 2017-08-26 (×4): 1000 [IU] via ORAL
  Filled 2017-08-24 (×4): qty 2

## 2017-08-24 MED ORDER — IPRATROPIUM-ALBUTEROL 0.5-2.5 (3) MG/3ML IN SOLN
3.0000 mL | RESPIRATORY_TRACT | Status: DC
  Administered 2017-08-24: 3 mL via RESPIRATORY_TRACT
  Filled 2017-08-24: qty 3

## 2017-08-24 MED ORDER — ACETAMINOPHEN 325 MG PO TABS
650.0000 mg | ORAL_TABLET | Freq: Four times a day (QID) | ORAL | Status: DC | PRN
Start: 2017-08-24 — End: 2017-08-29

## 2017-08-24 MED ORDER — CEFTRIAXONE SODIUM 1 G IJ SOLR
1.0000 g | INTRAMUSCULAR | Status: DC
  Administered 2017-08-25: 1 g via INTRAVENOUS
  Filled 2017-08-24: qty 1

## 2017-08-24 MED ORDER — SODIUM CHLORIDE 0.9 % IV SOLN
INTRAVENOUS | Status: DC
  Administered 2017-08-24 – 2017-08-25 (×2): via INTRAVENOUS

## 2017-08-24 MED ORDER — ALBUTEROL SULFATE (2.5 MG/3ML) 0.083% IN NEBU: 2.5000 mg | INHALATION_SOLUTION | Freq: Four times a day (QID) | RESPIRATORY_TRACT | Status: DC | PRN

## 2017-08-24 MED ORDER — DILTIAZEM HCL ER COATED BEADS 180 MG PO CP24
360.0000 mg | ORAL_CAPSULE | Freq: Every day | ORAL | Status: DC
  Administered 2017-08-25 – 2017-08-28 (×4): 360 mg via ORAL
  Filled 2017-08-24 (×5): qty 2

## 2017-08-24 MED ORDER — ONDANSETRON HCL 4 MG PO TABS: 4.0000 mg | ORAL_TABLET | Freq: Four times a day (QID) | ORAL | Status: DC | PRN

## 2017-08-24 MED ORDER — LATANOPROST 0.005 % OP SOLN
1.0000 [drp] | Freq: Every day | OPHTHALMIC | Status: DC
  Administered 2017-08-24 – 2017-08-28 (×5): 1 [drp] via OPHTHALMIC
  Filled 2017-08-24: qty 2.5

## 2017-08-24 MED ORDER — ALBUTEROL SULFATE (2.5 MG/3ML) 0.083% IN NEBU
5.0000 mg | INHALATION_SOLUTION | Freq: Once | RESPIRATORY_TRACT | Status: AC
  Administered 2017-08-24: 5 mg via RESPIRATORY_TRACT
  Filled 2017-08-24: qty 6

## 2017-08-24 MED ORDER — METHYLPREDNISOLONE SODIUM SUCC 125 MG IJ SOLR
125.0000 mg | Freq: Once | INTRAMUSCULAR | Status: AC
  Administered 2017-08-24: 125 mg via INTRAVENOUS
  Filled 2017-08-24: qty 2

## 2017-08-24 MED ORDER — METHYLPREDNISOLONE SODIUM SUCC 40 MG IJ SOLR
40.0000 mg | Freq: Four times a day (QID) | INTRAMUSCULAR | Status: AC
  Administered 2017-08-24 – 2017-08-25 (×4): 40 mg via INTRAVENOUS
  Filled 2017-08-24 (×4): qty 1

## 2017-08-24 MED ORDER — UMECLIDINIUM-VILANTEROL 62.5-25 MCG/INH IN AEPB
1.0000 | INHALATION_SPRAY | Freq: Every day | RESPIRATORY_TRACT | Status: DC
  Administered 2017-08-25 – 2017-08-26 (×2): 1 via RESPIRATORY_TRACT
  Filled 2017-08-24: qty 14

## 2017-08-24 MED ORDER — VITAMIN D3 25 MCG (1000 UNIT) PO TABS
1000.0000 [IU] | ORAL_TABLET | Freq: Two times a day (BID) | ORAL | Status: DC
  Administered 2017-08-24 – 2017-08-25 (×3): 1000 [IU] via ORAL
  Filled 2017-08-24 (×3): qty 1

## 2017-08-24 MED ORDER — NAPHAZOLINE-GLYCERIN 0.012-0.2 % OP SOLN
1.0000 [drp] | Freq: Four times a day (QID) | OPHTHALMIC | Status: DC | PRN
  Filled 2017-08-24: qty 15

## 2017-08-24 MED ORDER — SODIUM CHLORIDE 0.9 % IV SOLN
1.0000 g | Freq: Once | INTRAVENOUS | Status: AC
  Administered 2017-08-24: 1 g via INTRAVENOUS
  Filled 2017-08-24: qty 10

## 2017-08-24 MED ORDER — IPRATROPIUM-ALBUTEROL 0.5-2.5 (3) MG/3ML IN SOLN
3.0000 mL | Freq: Four times a day (QID) | RESPIRATORY_TRACT | Status: DC
  Administered 2017-08-25: 3 mL via RESPIRATORY_TRACT
  Filled 2017-08-24: qty 3

## 2017-08-24 MED ORDER — AZITHROMYCIN 250 MG PO TABS
250.0000 mg | ORAL_TABLET | Freq: Every day | ORAL | Status: AC
  Administered 2017-08-25 – 2017-08-28 (×4): 250 mg via ORAL
  Filled 2017-08-24 (×4): qty 1

## 2017-08-24 MED ORDER — ONDANSETRON HCL 4 MG/2ML IJ SOLN
4.0000 mg | Freq: Four times a day (QID) | INTRAMUSCULAR | Status: DC | PRN
Start: 2017-08-24 — End: 2017-08-29

## 2017-08-24 MED ORDER — TRAMADOL HCL 50 MG PO TABS
50.0000 mg | ORAL_TABLET | Freq: Four times a day (QID) | ORAL | Status: DC | PRN
  Administered 2017-08-26 – 2017-08-27 (×2): 50 mg via ORAL
  Filled 2017-08-24 (×2): qty 1

## 2017-08-24 NOTE — H&P (Signed)
History and Physical    [name]:[id number] DOB: April 25, 1927 DOA: 08/24/2017  PCP: Coralyn Helling, MD   Patient coming from: home    Chief Complaint: SOB  HPI: [name] is a 84 y.o. female with medical history significant of paroxysmal atrial fibrillation/flutter, chronic diastolic heart failure with grade 2 diastolic dysfunction on echo on 01/13/2017, stage III CKD, hyperlipidemia, hypertension, chronic respiratory failure on 2 L chronically at rest due to Gold stage II COPD by PFTs on 04/17/2016 who comes in with shortness of breath.  Patient reports that for the past 2 weeks she has had cough, congestion, rhinorrhea.  She reports that initially the cough was worse at night however it improved somewhat.  She also began to develop worsening dyspnea on exertion and shortness of breath.  She could no longer even get up to go to the bathroom without becoming increasingly dyspneic.  Her family increased her to 3 L nasal cannula continue to have dyspnea.  She reports using her inhaled bronchodilators with some improvement in her symptoms.  She also had significant amounts of weakness, malaise, fatigue.  Yesterday she began having fevers to 102 orally.  She was unable to tolerate much more than crackers and small amounts of water.  She is also intermittently had some diffuse abdominal pain that is nonradiating.  It is intermittent and crampy.  She had some nonbilious nonbloody emesis and some diarrhea that has no blood in it.  She has not had any dysuria.  She has not had any chest pain exertional or otherwise.  She has not had any syncope or presyncope.  She was seen by her PCP today where her oxygen saturations were noted to be 70% on 3 L nasal cannula and she was sent to the emergency room.  ED Course: In the emergency room she was noted to be in respiratory distress.  She was placed on BiPAP.  Labs are notable for mild hypokalemia at 3.3.  Creatinine was 1.  9 5 which is around her baseline.   White count was 10.7.  She was given IV antibiotics and IV steroids and bronchodilators.  Review of Systems: As per HPI otherwise 10 point review of systems negative.    Past Medical History:  Diagnosis Date  . Anticoagulated    Eliquis  . Atrial flutter (HCC)    NO CARDIOLOGIST FOLLOWED BY DR Tori Milks  . Bloody stools    LAST 4 DAYS  . CHF (congestive heart failure) (HCC)   . Chronic back pain   . CKD (chronic kidney disease), stage III (HCC)    Creatinine 1.35 -1.55  . COPD (chronic obstructive pulmonary disease) (HCC)   . Diverticulosis   . Dyspnea   . Dysrhythmia   . Edema   . Gait disorder   . Glaucoma    Severe  . Glaucoma    both eyes  . High cholesterol   . Multinodular goiter   . PAOD (peripheral arterial occlusive disease) (HCC)    Stein in right groin, decrease pulses  . PMR (polymyalgia rheumatica) (HCC)   . Pneumonia, organism unspecified(486) 04/18/2016  . Protein-calorie malnutrition (HCC)   . Rash of back   . Weakness     Past Surgical History:  Procedure Laterality Date  . COLONOSCOPY N/A 07/15/2013   Procedure: COLONOSCOPY;  Surgeon: Louis Meckel, MD;  Location: [facility];  Service: Endoscopy;  Laterality: N/A;  . ESOPHAGOGASTRODUODENOSCOPY N/A 07/08/2013   Procedure: ESOPHAGOGASTRODUODENOSCOPY (EGD);  Surgeon: Barbette Hair  Arlyce Dice, MD;  Location: [facility];  Service: Endoscopy;  Laterality: N/A;  Per Connye Burkitt, will try to have CRNA available, but will not guaranteee. Ok to schedule per Clydie Braun 07/06/13  . GALLBLADDER SURGERY    . IR GENERIC HISTORICAL  05/18/2014   IR RADIOLOGIST EVAL & MGMT 05/18/2014 Berdine Dance, MD [facility] INTERV RAD  . LAMINECTOMY    . leg stent Right   . SHOULDER SURGERY     frozen shoulder/ Bil shoulders     reports that she quit smoking about 24 years ago. Her smoking use included cigarettes. She has a 15.00 pack-year smoking history. She has never used smokeless tobacco. She reports that she does not drink alcohol or  use drugs.  Allergies  Allergen Reactions  . Codeine Sulfate Nausea And Vomiting and Other (See Comments)    headache    Family History  Problem Relation Age of Onset  . Throat cancer Mother   . Diabetes Maternal Uncle   . Throat cancer Maternal Grandfather   . Cirrhosis Maternal Aunt     Prior to Admission medications   Medication Sig Start Date End Date Taking? Authorizing Provider  acetaminophen (TYLENOL) 500 MG tablet Take 500-1,000 mg by mouth every 4 (four) hours as needed for mild pain.    Yes [provider]  albuterol (PROAIR HFA) 108 (90 Base) MCG/ACT inhaler Inhale 2 puffs into the lungs every 6 (six) hours as needed for wheezing or shortness of breath. 07/18/16  Yes Coralyn Helling, MD  atorvastatin (LIPITOR) 10 MG tablet Take 5 mg by mouth daily.    Yes [provider]  bimatoprost (LUMIGAN) 0.01 % SOLN Place 1 drop into both eyes at bedtime.   Yes [provider]  cholecalciferol (VITAMIN D) 1000 UNITS tablet Take 1,000 Units by mouth 2 (two) times daily.    Yes [provider]  diltiazem (CARDIZEM CD) 360 MG 24 hr capsule Take 1 capsule (360 mg total) by mouth daily. 01/14/17  Yes Kathlen Mody, MD  ELIQUIS 2.5 MG TABS tablet Take 2.5 mg by mouth 2 (two) times daily. 04/11/16  Yes [provider]  feeding supplement, ENSURE ENLIVE, (ENSURE ENLIVE) LIQD Take 237 mLs by mouth 2 (two) times daily between meals. 08/01/16  Yes Ghimire, Werner Lean, MD  furosemide (LASIX) 20 MG tablet Take 1 tablet (20 mg total) by mouth daily. 09/08/16  Yes Randel Pigg, Dorma Russell, MD  OXYGEN Inhale 2 L into the lungs daily.   Yes [provider]  potassium chloride (K-DUR,KLOR-CON) 10 MEQ tablet Take 10 mEq by mouth 2 (two) times daily.   Yes [provider]  tetrahydrozoline 0.05 % ophthalmic solution Place 1 drop into both eyes 4 (four) times daily as needed (For dry eyes.).   Yes [provider]  umeclidinium-vilanterol (ANORO  ELLIPTA) 62.5-25 MCG/INH AEPB Inhale 1 puff into the lungs daily. 01/30/17  Yes Parrett, Tammy S, NP  vitamin E 1000 UNIT capsule Take 1,000 Units by mouth 2 (two) times daily.    Yes [provider]  albuterol (PROVENTIL) (2.5 MG/3ML) 0.083% nebulizer solution Take 3 mLs (2.5 mg total) by nebulization every 6 (six) hours as needed for wheezing or shortness of breath. DX: J44.9 Patient not taking: Reported on 05/12/2017 01/07/17   Bevelyn Ngo, NP  benzonatate (TESSALON) 100 MG capsule Take 1 capsule (100 mg total) by mouth 3 (three) times daily as needed for cough. Patient not taking: Reported on 08/24/2017 09/08/16   Lenox Ponds, MD  INCRUSE ELLIPTA 62.5 MCG/INH AEPB INHALE 1 PUFF ONCE DAILY Patient not taking: Reported on 08/24/2017 07/08/17   Parrett, Virgel Bouquet, NP  predniSONE (DELTASONE) 10 MG tablet TAKE 3 TABS/DAY X4DAYS, THEN 2 TABS/DAY X5DAYS, THEN 1 TAB/DAY X5 DAYS, Patient not taking: Reported on 08/24/2017 02/18/17   Coralyn Helling, MD    Physical Exam: Vitals:   08/24/17 1500 08/24/17 1507 08/24/17 1515 08/24/17 1530  BP: 106/75 106/75  102/79  Pulse:  88  (!) 105  Resp: (!) 34 (!) 34  (!) 34  Temp:      TempSrc:      SpO2:  98% 100% 100%  Weight:      Height:        Constitutional: NAD, calm, comfortable Vitals:   08/24/17 1500 08/24/17 1507 08/24/17 1515 08/24/17 1530  BP: 106/75 106/75  102/79  Pulse:  88  (!) 105  Resp: (!) 34 (!) 34  (!) 34  Temp:      TempSrc:      SpO2:  98% 100% 100%  Weight:      Height:       Eyes: Anicteric sclera ENMT: Dry mucous membranes, BiPAP in place Neck: normal, supple Respiratory: Moderately increased work of breathing, clear to auscultation bilaterally, scattered rhonchi over anterior lung fields Cardiovascular: Distant heart sounds, regular rate and rhythm, no murmurs Abdomen: no tenderness, no masses palpated. No hepatosplenomegaly. Bowel sounds positive.  Musculoskeletal: Right lower extremity edema Skin: no rashes  on visible skin Neurologic: Fully intact, moving all extremities Psychiatric: Unable to test due to medical condition   Labs on Admission: I have personally reviewed following labs and imaging studies  CBC: Recent Labs  Lab 08/24/17 1211  WBC 10.7*  NEUTROABS 8.6*  HGB 15.2*  HCT 45.0  MCV 91.3  PLT 417*   Basic Metabolic Panel: Recent Labs  Lab 08/24/17 1211  NA 139  K 3.3*  CL 98*  CO2 25  GLUCOSE 132*  BUN 27*  CREATININE 1.95*  CALCIUM 9.4   GFR: Estimated Creatinine Clearance: 15.3 mL/min (A) (by C-G formula based on SCr of 1.95 mg/dL (H)). Liver Function Tests: Recent Labs  Lab 08/24/17 1211  AST 25  ALT 25  ALKPHOS 80  BILITOT 0.8  PROT 8.0  ALBUMIN 3.4*   No results for input(s): LIPASE, AMYLASE in the last 168 hours. No results for input(s): AMMONIA in the last 168 hours. Coagulation Profile: Recent Labs  Lab 08/24/17 1211  INR 1.42   Cardiac Enzymes: No results for input(s): CKTOTAL, CKMB, CKMBINDEX, TROPONINI in the last 168 hours. BNP (last 3 results) No results for input(s): PROBNP in the last 8760 hours. HbA1C: No results for input(s): HGBA1C in the last 72 hours. CBG: No results for input(s): GLUCAP in the last 168 hours. Lipid Profile: No results for input(s): CHOL, HDL, LDLCALC, TRIG, CHOLHDL, LDLDIRECT in the last 72 hours. Thyroid Function Tests: No results for input(s): TSH, T4TOTAL, FREET4, T3FREE, THYROIDAB in the last 72 hours. Anemia Panel: No results for input(s): VITAMINB12, FOLATE, FERRITIN, TIBC, IRON, RETICCTPCT in the last 72 hours. Urine analysis:    Component Value Date/Time   COLORURINE YELLOW 08/24/2017 1456   APPEARANCEUR CLEAR 08/24/2017 1456   LABSPEC 1.010 08/24/2017 1456   PHURINE 5.0 08/24/2017 1456   GLUCOSEU NEGATIVE 08/24/2017 1456   HGBUR SMALL (A) 08/24/2017 1456   BILIRUBINUR NEGATIVE 08/24/2017 1456   KETONESUR NEGATIVE 08/24/2017 1456   PROTEINUR NEGATIVE 08/24/2017 1456   NITRITE NEGATIVE  08/24/2017 1456  LEUKOCYTESUR MODERATE (A) 08/24/2017 1456    Radiological Exams on Admission: Dg Chest 2 View  Result Date: 08/24/2017 CLINICAL DATA:  Dyspnea EXAM: CHEST - 2 VIEW COMPARISON:  01/12/2017 chest radiograph. FINDINGS: Stable cardiomediastinal silhouette with normal heart size. No pneumothorax. No pleural effusion. Mildly hyperinflated lungs. Emphysema. No pulmonary edema. Mild chronic bibasilar scarring. No acute consolidative airspace disease. IMPRESSION: No acute cardiopulmonary disease. Mildly hyperinflated lungs and emphysema, suggesting COPD. Mild chronic bibasilar scarring. Electronically Signed   By: Delbert Phenix M.D.   On: 08/24/2017 13:28    EKG: Independently reviewed.  Extremely poor baseline quality, sinus, no acute ST segment changes, unchanged from prior  Assessment/Plan Active Problems:   PAF (paroxysmal atrial fibrillation) (HCC)   CKD (chronic kidney disease), stage III (HCC)   CHF (congestive heart failure) (HCC)   Hyperlipidemia   Chronic respiratory failure with hypoxia (HCC)   COPD (chronic obstructive pulmonary disease) (HCC)   COPD exacerbation (HCC)   Acute on chronic respiratory failure (HCC)   Essential hypertension    #) Acute hypoxemic respiratory failure in the setting of gold stage II COPD: Suspect most likely the patient had viral illness that subsequently became bacterial or has persistent viral illness that is causing a COPD exacerbation.  It appears to be quite severe though much better controlled now that she is on positive pressure support as well as received bronchodilators and steroids.  Have low suspicion that her diastolic dysfunction at this time is playing a role. -IV ceftriaxone and azithromycin -Follow-up blood cultures on 08/24/2017 -IV methylprednisolone 40 mg every 6 then transition to prednisone 40 mg daily - Every 4 hours bronchodilators and PRN -Continue L AMA/LA BA  #) Paroxysmal atrial fibrillation/atrial flutter: -  Continue apixaban 2.5 mg twice daily -Continue diltiazem 360 mg daily  #) Hyperlipidemia: -Continue atorvastatin 5 mg daily  #) Grade 2 diastolic dysfunction/chronic systolic heart failure: -Hold furosemide -Gentle IV hydration -Follow-up BNP   Fluids: Gentle IV fluids Electrolytes: Monitor and supplement Nutrition: Regular diet  Prophylaxis: On apixaban  Disposition: Pending improved respiratory status  DO NOT RESUSCITATE    Delaine Lame MD [facility]   If 7PM-7AM, please contact night-coverage [email or web address] Password [facility]  08/24/2017, 4:15 PM

## 2017-08-24 NOTE — ED Triage Notes (Signed)
Per daughter: Pt coming from Dr. Thomasene Lot office after having oxygen levels at 76% on 2L [place]. Pt has a hx of COPD, Afib, and pneumonia. Pt had been increased to 3L [place] with oxygen levels coming up to 85%.  Pt currently having labored breathing.  Pt's daughter also reports she had a fever of 101.3 last night.

## 2017-08-24 NOTE — Progress Notes (Signed)
PHARMACY NOTE:  ANTIMICROBIAL RENAL DOSAGE ADJUSTMENT  Current antimicrobial regimen includes a mismatch between antimicrobial dosage and estimated renal function.  As per policy approved by the Pharmacy & Therapeutics and Medical Executive Committees, the antimicrobial dosage will be adjusted accordingly.  Current antimicrobial dosage:  Rocephin 2gram IV Q 24 hours  Indication: COPD  Renal Function:  Estimated Creatinine Clearance: 15.3 mL/min (A) (by C-G formula based on SCr of 1.95 mg/dL (H)).      On intermittent HD, scheduled:      On CRRT    Antimicrobial dosage has been changed to:  Rocephin 1 Gram IV  Q 24 hours  Additional comments:   Thank you for allowing pharmacy to be a part of this patient's care.  Loletta Specter, [facility] 08/24/2017 6:50 PM

## 2017-08-24 NOTE — ED Provider Notes (Addendum)
[facility]-EMERGENCY DEPT Provider Note   CSN: [id number] Arrival date & time: 08/24/17  1159     History   Chief Complaint Chief Complaint  Patient presents with  . Shortness of Breath    HPI [name] is a 84 y.o. female.  HPI Patient presents with shortness of breath for last couple days.  Is on chronic oxygen at 2 L and recently it increased to 3.  Sats would go down into the 70s with exertion.  History of A. fib and COPD.  On chronic oxygen.  At baseline she will desat into the 80s with ambulation.  Also has had fevers yesterday up to 101.3.  Slight sputum production.  History of paroxysmal A. fib.  Has to use her rescue inhaler 3-4 times a day. Past Medical History:  Diagnosis Date  . Anticoagulated    Eliquis  . Atrial flutter (HCC)    NO CARDIOLOGIST FOLLOWED BY DR Tori Milks  . Bloody stools    LAST 4 DAYS  . CHF (congestive heart failure) (HCC)   . Chronic back pain   . CKD (chronic kidney disease), stage III (HCC)    Creatinine 1.35 -1.55  . COPD (chronic obstructive pulmonary disease) (HCC)   . Diverticulosis   . Dyspnea   . Dysrhythmia   . Edema   . Gait disorder   . Glaucoma    Severe  . Glaucoma    both eyes  . High cholesterol   . Multinodular goiter   . PAOD (peripheral arterial occlusive disease) (HCC)    Stein in right groin, decrease pulses  . PMR (polymyalgia rheumatica) (HCC)   . Pneumonia, organism unspecified(486) 04/18/2016  . Protein-calorie malnutrition (HCC)   . Rash of back   . Weakness     Patient Active Problem List   Diagnosis Date Noted  . Postural dizziness with presyncope   . Essential hypertension   . Acute on chronic respiratory failure (HCC) 01/01/2017  . COPD exacerbation (HCC) 09/05/2016  . Malnutrition of moderate degree 07/31/2016  . CRI (chronic renal insufficiency), stage 4 (severe) (HCC)   . Acute on chronic combined systolic and diastolic CHF (congestive heart failure) (HCC)   .  Respiratory distress 07/29/2016  . Acute respiratory distress 07/29/2016  . CAD (coronary artery disease) 07/29/2016  . Chronic musculoskeletal pain 07/29/2016  . COPD without exacerbation (HCC) 07/29/2016  . Acute diastolic (congestive) heart failure (HCC)   . Chronic respiratory failure with hypoxia (HCC) 05/09/2016  . COPD (chronic obstructive pulmonary disease) (HCC) 05/09/2016  . Thrush, oral 05/02/2016  . Dyspnea 04/25/2016  . Gait disorder   . Weakness   . CKD (chronic kidney disease), stage III (HCC)   . Protein-calorie malnutrition (HCC)   . Multinodular goiter   . PAOD (peripheral arterial occlusive disease) (HCC)   . Chronic back pain   . CHF (congestive heart failure) (HCC)   . Atrial flutter (HCC)   . Edema   . Glaucoma   . Hyperlipidemia   . PMR (polymyalgia rheumatica) (HCC)   . CAP (community acquired pneumonia)   . PAF (paroxysmal atrial fibrillation) (HCC) 04/14/2016  . Chronic anticoagulation 04/14/2016  . Diverticulosis of colon (without mention of hemorrhage) 07/15/2013  . Arthropathy 04/06/2007  . ESOPHAGITIS 03/29/2007    Past Surgical History:  Procedure Laterality Date  . COLONOSCOPY N/A 07/15/2013   Procedure: COLONOSCOPY;  Surgeon: Louis Meckel, MD;  Location: [facility] ENDOSCOPY;  Service: Endoscopy;  Laterality: N/A;  . ESOPHAGOGASTRODUODENOSCOPY  N/A 07/08/2013   Procedure: ESOPHAGOGASTRODUODENOSCOPY (EGD);  Surgeon: Louis Meckel, MD;  Location: [facility];  Service: Endoscopy;  Laterality: N/A;  Per Connye Burkitt, will try to have CRNA available, but will not guaranteee. Ok to schedule per Clydie Braun 07/06/13  . GALLBLADDER SURGERY    . IR GENERIC HISTORICAL  05/18/2014   IR RADIOLOGIST EVAL & MGMT 05/18/2014 Berdine Dance, MD [facility] INTERV RAD  . LAMINECTOMY    . leg stent Right   . SHOULDER SURGERY     frozen shoulder/ Bil shoulders     OB History   None      Home Medications    Prior to Admission medications   Medication Sig Start Date End  Date Taking? Authorizing Provider  acetaminophen (TYLENOL) 500 MG tablet Take 500-1,000 mg by mouth every 4 (four) hours as needed for mild pain.    Yes [provider]  albuterol (PROAIR HFA) 108 (90 Base) MCG/ACT inhaler Inhale 2 puffs into the lungs every 6 (six) hours as needed for wheezing or shortness of breath. 07/18/16  Yes Coralyn Helling, MD  atorvastatin (LIPITOR) 10 MG tablet Take 5 mg by mouth daily.    Yes [provider]  bimatoprost (LUMIGAN) 0.01 % SOLN Place 1 drop into both eyes at bedtime.   Yes [provider]  cholecalciferol (VITAMIN D) 1000 UNITS tablet Take 1,000 Units by mouth 2 (two) times daily.    Yes [provider]  diltiazem (CARDIZEM CD) 360 MG 24 hr capsule Take 1 capsule (360 mg total) by mouth daily. 01/14/17  Yes Kathlen Mody, MD  ELIQUIS 2.5 MG TABS tablet Take 2.5 mg by mouth 2 (two) times daily. 04/11/16  Yes [provider]  feeding supplement, ENSURE ENLIVE, (ENSURE ENLIVE) LIQD Take 237 mLs by mouth 2 (two) times daily between meals. 08/01/16  Yes Ghimire, Werner Lean, MD  furosemide (LASIX) 20 MG tablet Take 1 tablet (20 mg total) by mouth daily. 09/08/16  Yes Randel Pigg, Dorma Russell, MD  OXYGEN Inhale 2 L into the lungs daily.   Yes [provider]  potassium chloride (K-DUR,KLOR-CON) 10 MEQ tablet Take 10 mEq by mouth 2 (two) times daily.   Yes [provider]  tetrahydrozoline 0.05 % ophthalmic solution Place 1 drop into both eyes 4 (four) times daily as needed (For dry eyes.).   Yes [provider]  umeclidinium-vilanterol (ANORO ELLIPTA) 62.5-25 MCG/INH AEPB Inhale 1 puff into the lungs daily. 01/30/17  Yes Parrett, Tammy S, NP  vitamin E 1000 UNIT capsule Take 1,000 Units by mouth 2 (two) times daily.    Yes [provider]  albuterol (PROVENTIL) (2.5 MG/3ML) 0.083% nebulizer solution Take 3 mLs (2.5 mg total) by nebulization every 6 (six) hours as needed for wheezing or shortness of  breath. DX: J44.9 Patient not taking: Reported on 05/12/2017 01/07/17   Bevelyn Ngo, NP  benzonatate (TESSALON) 100 MG capsule Take 1 capsule (100 mg total) by mouth 3 (three) times daily as needed for cough. Patient not taking: Reported on 08/24/2017 09/08/16   Randel Pigg, Dorma Russell, MD  INCRUSE ELLIPTA 62.5 MCG/INH AEPB INHALE 1 PUFF ONCE DAILY Patient not taking: Reported on 08/24/2017 07/08/17   Parrett, Virgel Bouquet, NP  predniSONE (DELTASONE) 10 MG tablet TAKE 3 TABS/DAY X4DAYS, THEN 2 TABS/DAY X5DAYS, THEN 1 TAB/DAY X5 DAYS, Patient not taking: Reported on 08/24/2017 02/18/17   Coralyn Helling, MD    Family History Family History  Problem Relation Age of Onset  . Throat  cancer Mother   . Diabetes Maternal Uncle   . Throat cancer Maternal Grandfather   . Cirrhosis Maternal Aunt     Social History Social History   Tobacco Use  . Smoking status: Former Smoker    Packs/day: 1.00    Years: 15.00    Pack years: 15.00    Types: Cigarettes    Last attempt to quit: 07/12/1993    Years since quitting: 24.1  . Smokeless tobacco: Never Used  Substance Use Topics  . Alcohol use: No  . Drug use: No     Allergies   Codeine sulfate   Review of Systems Review of Systems  Constitutional: Negative for appetite change.  HENT: Negative for congestion.   Respiratory: Positive for cough and shortness of breath.   Cardiovascular: Negative for chest pain.  Gastrointestinal: Negative for abdominal pain.  Endocrine: Negative for polyuria.  Genitourinary: Negative for flank pain.  Musculoskeletal: Negative for back pain.  Neurological: Positive for weakness.  Psychiatric/Behavioral: Negative for confusion.     Physical Exam Updated Vital Signs BP 106/75   Pulse 88   Temp 98.3 F (36.8 C) (Oral)   Resp (!) 34   Ht  (1.575 m)   Wt 48.5 kg (107 lb)   SpO2 98%   BMI 19.57 kg/m   Physical Exam  Constitutional: She appears well-developed.  HENT:  Head: Atraumatic.  Eyes: Pupils are  equal, round, and reactive to light.  Cardiovascular:  Irregular tachycardia  Pulmonary/Chest:  Mildly decreased throughout and harsh but no frank wheezes.  Abdominal: Soft. There is no tenderness.  Musculoskeletal:       Right lower leg: She exhibits no edema.       Left lower leg: She exhibits no edema.  Neurological: She is alert.  Skin: Skin is warm. Capillary refill takes less than 2 seconds.     ED Treatments / Results  Labs (all labs ordered are listed, but only abnormal results are displayed) Labs Reviewed  COMPREHENSIVE METABOLIC PANEL - Abnormal; Notable for the following components:      Result Value   Potassium 3.3 (*)    Chloride 98 (*)    Glucose, Bld 132 (*)    BUN 27 (*)    Creatinine, Ser 1.95 (*)    Albumin 3.4 (*)    GFR calc non Af Amer 22 (*)    GFR calc Af Amer 25 (*)    Anion gap 16 (*)    All other components within normal limits  CBC WITH DIFFERENTIAL/PLATELET - Abnormal; Notable for the following components:   WBC 10.7 (*)    Hemoglobin 15.2 (*)    Platelets 417 (*)    Neutro Abs 8.6 (*)    Lymphs Abs 0.5 (*)    Monocytes Absolute 1.3 (*)    All other components within normal limits  PROTIME-INR - Abnormal; Notable for the following components:   Prothrombin Time 17.2 (*)    All other components within normal limits  I-STAT CG4 LACTIC ACID, ED - Abnormal; Notable for the following components:   Lactic Acid, Venous 2.39 (*)    All other components within normal limits  CULTURE, BLOOD (ROUTINE X 2)  CULTURE, BLOOD (ROUTINE X 2)  URINALYSIS, ROUTINE W REFLEX MICROSCOPIC  BLOOD GAS, ARTERIAL  I-STAT CG4 LACTIC ACID, ED    EKG EKG Interpretation  Date/Time:  Monday Aug 24 2017 12:24:23 EDT Ventricular Rate:  108 PR Interval:    QRS Duration: 86 QT Interval:  345 QTC Calculation: 441 R Axis:   83 Text Interpretation:  Atrial fibrillation Ventricular premature complex Borderline right axis deviation Borderline T abnormalities, anterior  leads Confirmed by Benjiman Core [id number]) on 08/24/2017 3:34:02 PM   Radiology Dg Chest 2 View  Result Date: 08/24/2017 CLINICAL DATA:  Dyspnea EXAM: CHEST - 2 VIEW COMPARISON:  01/12/2017 chest radiograph. FINDINGS: Stable cardiomediastinal silhouette with normal heart size. No pneumothorax. No pleural effusion. Mildly hyperinflated lungs. Emphysema. No pulmonary edema. Mild chronic bibasilar scarring. No acute consolidative airspace disease. IMPRESSION: No acute cardiopulmonary disease. Mildly hyperinflated lungs and emphysema, suggesting COPD. Mild chronic bibasilar scarring. Electronically Signed   By: Delbert Phenix M.D.   On: 08/24/2017 13:28    Procedures Procedures (including critical care time)  Medications Ordered in ED Medications  cefTRIAXone (ROCEPHIN) 1 g in sodium chloride 0.9 % 100 mL IVPB (has no administration in time range)  azithromycin (ZITHROMAX) 500 mg in sodium chloride 0.9 % 250 mL IVPB (has no administration in time range)  albuterol (PROVENTIL) (2.5 MG/3ML) 0.083% nebulizer solution 5 mg (5 mg Nebulization Given 08/24/17 1229)  methylPREDNISolone sodium succinate (SOLU-MEDROL) 125 mg/2 mL injection 125 mg (125 mg Intravenous Given 08/24/17 1522)     Initial Impression / Assessment and Plan / ED Course  I have reviewed the triage vital signs and the nursing notes.  Pertinent labs & imaging results that were available during my care of the patient were reviewed by me and considered in my medical decision making (see chart for details).     Patient with shortness of breath.  Likely COPD exacerbation.  X-ray does not show pneumonia.  However does have some dyspnea and has been feeling somewhat worse now.  Will attempt BiPAP.  Will admit to hospitalist.  Breathing treatment repeated and given steroids.  With fevers will likely require antibiotics.  CRITICAL CARE Performed by: Benjiman Core Total critical care time: 30 minutes Critical care time was exclusive of  separately billable procedures and treating other patients. Critical care was necessary to treat or prevent imminent or life-threatening deterioration. Critical care was time spent personally by me on the following activities: development of treatment plan with patient and/or surrogate as well as nursing, discussions with consultants, evaluation of patient's response to treatment, examination of patient, obtaining history from patient or surrogate, ordering and performing treatments and interventions, ordering and review of laboratory studies, ordering and review of radiographic studies, pulse oximetry and re-evaluation of patient's condition.   Final Clinical Impressions(s) / ED Diagnoses   Final diagnoses:  COPD exacerbation [facility])    ED Discharge Orders    None       Benjiman Core, MD 08/24/17 1523    Benjiman Core, MD 08/24/17 1534

## 2017-08-24 NOTE — ED Notes (Signed)
ED TO INPATIENT HANDOFF REPORT  Name/Age/Gender [name] 84 y.o. female  Code Status Code Status History    Date Active Date Inactive Code Status Order ID Comments User Context   01/12/2017 2019 01/14/2017 1714 DNR [id number]  Florencia Reasons, MD Inpatient   01/12/2017 1724 01/12/2017 2019 DNR [id number]  Florencia Reasons, MD ED   09/05/2016 1913 09/08/2016 1812 DNR [id number]  Janece Canterbury, MD Inpatient   07/29/2016 1430 08/01/2016 1737 DNR [id number]  Waldemar Dickens, MD Inpatient   04/14/2016 1612 04/18/2016 1544 Full Code [id number]  Donne Hazel, MD ED    Questions for Most Recent Historical Code Status (Order [id number])    Question Answer Comment   In the event of cardiac or respiratory ARREST Do not call a "code blue"    In the event of cardiac or respiratory ARREST Do not perform Intubation, CPR, defibrillation or ACLS    In the event of cardiac or respiratory ARREST Use medication by any route, position, wound care, and other measures to relive pain and suffering. May use oxygen, suction and manual treatment of airway obstruction as needed for comfort.       Home/SNF/Other Home  Chief Complaint copd, sob, fever  Level of Care/Admitting Diagnosis ED Disposition    ED Disposition Condition Comment   Admit  Hospital Area: [facility] [[id number]]  Level of Care: Stepdown [14]  Admit to SDU based on following criteria: Respiratory Distress:  Frequent assessment and/or intervention to maintain adequate ventilation/respiration, pulmonary toilet, and respiratory treatment.  Diagnosis: Acute hypoxemic respiratory failure [facility]) [[id number]]  Admitting Physician: Cristy Folks [[id number]]  Attending Physician: Cristy Folks [phone]  Estimated length of stay: 3 - 4 days  Certification:: I certify this patient will need inpatient services for at least 2 midnights  PT Class (Do Not Modify): Inpatient [101]  PT Acc Code (Do Not Modify): Private [1]       Medical  History Past Medical History:  Diagnosis Date  . Anticoagulated    Eliquis  . Atrial flutter ([place])    NO CARDIOLOGIST FOLLOWED BY DR Conni Slipper  . Bloody stools    LAST 4 DAYS  . CHF (congestive heart failure) ([place])   . Chronic back pain   . CKD (chronic kidney disease), stage III (HCC)    Creatinine 1.35 -1.55  . COPD (chronic obstructive pulmonary disease) ([place])   . Diverticulosis   . Dyspnea   . Dysrhythmia   . Edema   . Gait disorder   . Glaucoma    Severe  . Glaucoma    both eyes  . High cholesterol   . Multinodular goiter   . PAOD (peripheral arterial occlusive disease) (HCC)    Stein in right groin, decrease pulses  . PMR (polymyalgia rheumatica) (HCC)   . Pneumonia, organism unspecified(486) 04/18/2016  . Protein-calorie malnutrition ([place])   . Rash of back   . Weakness     Allergies Allergies  Allergen Reactions  . Codeine Sulfate Nausea And Vomiting and Other (See Comments)    headache    IV Location/Drains/Wounds Patient Lines/Drains/Airways Status   Active Line/Drains/Airways    Name:   Placement date:   Placement time:   Site:   Days:   Peripheral IV 08/24/17 Right Antecubital   08/24/17    1243    Antecubital   less than 1          Labs/Imaging Results for orders placed or performed during the hospital encounter  of 08/24/17 (from the past 48 hour(s))  Comprehensive metabolic panel     Status: Abnormal   Collection Time: 08/24/17 12:11 PM  Result Value Ref Range   Sodium 139 135 - 145 mmol/L   Potassium 3.3 (L) 3.5 - 5.1 mmol/L   Chloride 98 (L) 101 - 111 mmol/L   CO2 25 22 - 32 mmol/L   Glucose, Bld 132 (H) 65 - 99 mg/dL   BUN 27 (H) 6 - 20 mg/dL   Creatinine, Ser 1.95 (H) 0.44 - 1.00 mg/dL   Calcium 9.4 8.9 - 10.3 mg/dL   Total Protein 8.0 6.5 - 8.1 g/dL   Albumin 3.4 (L) 3.5 - 5.0 g/dL   AST 25 15 - 41 U/L   ALT 25 14 - 54 U/L   Alkaline Phosphatase 80 38 - 126 U/L   Total Bilirubin 0.8 0.3 - 1.2 mg/dL   GFR calc non Af Amer 22  (L) >60 mL/min   GFR calc Af Amer 25 (L) >60 mL/min    Comment: (NOTE) The eGFR has been calculated using the CKD EPI equation. This calculation has not been validated in all clinical situations. eGFR's persistently <60 mL/min signify possible Chronic Kidney Disease.    Anion gap 16 (H) 5 - 15    Comment: Performed at [facility], [place] [address] [place], [place] [address]  CBC with Differential     Status: Abnormal   Collection Time: 08/24/17 12:11 PM  Result Value Ref Range   WBC 10.7 (H) 4.0 - 10.5 K/uL   RBC 4.93 3.87 - 5.11 MIL/uL   Hemoglobin 15.2 (H) 12.0 - 15.0 g/dL   HCT 45.0 36.0 - 46.0 %   MCV 91.3 78.0 - 100.0 fL   MCH 30.8 26.0 - 34.0 pg   MCHC 33.8 30.0 - 36.0 g/dL   RDW 14.0 11.5 - 15.5 %   Platelets 417 (H) 150 - 400 K/uL   Neutrophils Relative % 80 %   Neutro Abs 8.6 (H) 1.7 - 7.7 K/uL   Lymphocytes Relative 5 %   Lymphs Abs 0.5 (L) 0.7 - 4.0 K/uL   Monocytes Relative 13 %   Monocytes Absolute 1.3 (H) 0.1 - 1.0 K/uL   Eosinophils Relative 2 %   Eosinophils Absolute 0.2 0.0 - 0.7 K/uL   Basophils Relative 0 %   Basophils Absolute 0.0 0.0 - 0.1 K/uL    Comment: Performed at [facility], [place] [address], [place], [place] [address]  Protime-INR     Status: Abnormal   Collection Time: 08/24/17 12:11 PM  Result Value Ref Range   Prothrombin Time 17.2 (H) 11.4 - 15.2 seconds   INR 1.42     Comment: Performed at [facility], [place] [address], [place],  [address]  I-Stat CG4 Lactic Acid, ED     Status: Abnormal   Collection Time: 08/24/17 12:52 PM  Result Value Ref Range   Lactic Acid, Venous 2.39 (HH) 0.5 - 1.9 mmol/L   Comment NOTIFIED PHYSICIAN   Urinalysis, Routine w reflex microscopic     Status: Abnormal   Collection Time: 08/24/17  2:56 PM  Result Value Ref Range   Color, Urine YELLOW YELLOW   APPearance CLEAR CLEAR   Specific Gravity, Urine 1.010 1.005 - 1.030   pH 5.0 5.0 - 8.0    Glucose, UA NEGATIVE NEGATIVE mg/dL   Hgb urine dipstick SMALL (A) NEGATIVE   Bilirubin Urine NEGATIVE NEGATIVE   Ketones, ur NEGATIVE  NEGATIVE mg/dL   Protein, ur NEGATIVE NEGATIVE mg/dL   Nitrite NEGATIVE NEGATIVE   Leukocytes, UA MODERATE (A) NEGATIVE   RBC / HPF 0-5 0 - 5 RBC/hpf   WBC, UA 11-20 0 - 5 WBC/hpf   Bacteria, UA NONE SEEN NONE SEEN   Squamous Epithelial / LPF 0-5 0 - 5   Mucus PRESENT    Hyaline Casts, UA PRESENT     Comment: Performed at [facility], [place] [address] [place], [place] [address]  Blood gas, arterial     Status: Abnormal   Collection Time: 08/24/17  3:19 PM  Result Value Ref Range   FIO2 100.00    Delivery systems VENTILATOR    Mode NIV PS    Inspiratory PAP 12.0    Expiratory PAP 6.0    pH, Arterial 7.544 (H) 7.350 - 7.450   pCO2 arterial 25.9 (L) 32.0 - 48.0 mmHg   pO2, Arterial 480 (H) 83.0 - 108.0 mmHg   Bicarbonate 22.3 20.0 - 28.0 mmol/L   Acid-Base Excess 1.4 0.0 - 2.0 mmol/L   O2 Saturation 99.7 %   Patient temperature 98.6    Collection site RIGHT RADIAL    Drawn by [id number]    Sample type ARTERIAL DRAW    Allens test (pass/fail) PASS PASS    Comment: Performed at [place] [address] [place],  [address]  I-Stat CG4 Lactic Acid, ED     Status: Abnormal   Collection Time: 08/24/17  5:18 PM  Result Value Ref Range   Lactic Acid, Venous 1.98 (H) 0.5 - 1.9 mmol/L   Dg Chest 2 View  Result Date: 08/24/2017 CLINICAL DATA:  Dyspnea EXAM: CHEST - 2 VIEW COMPARISON:  01/12/2017 chest radiograph. FINDINGS: Stable cardiomediastinal silhouette with normal heart size. No pneumothorax. No pleural effusion. Mildly hyperinflated lungs. Emphysema. No pulmonary edema. Mild chronic bibasilar scarring. No acute consolidative airspace disease. IMPRESSION: No acute cardiopulmonary disease. Mildly hyperinflated lungs and emphysema, suggesting COPD. Mild chronic bibasilar scarring. Electronically Signed   By:  Ilona Sorrel M.D.   On: 08/24/2017 13:28    Pending Labs Unresulted Labs (From admission, onward)   Start     Ordered   08/24/17 1211  Culture, blood (Routine x 2)  BLOOD CULTURE X 2,   STAT     08/24/17 1211   Signed and Held  Brain natriuretic peptide  Once,   R     Signed and Held   Signed and Held  Basic metabolic panel  Tomorrow morning,   R     Signed and Held   Signed and Held  CBC  Tomorrow morning,   R     Signed and Held      Vitals/Pain Today's Vitals   08/24/17 1530 08/24/17 1630 08/24/17 1645 08/24/17 1700  BP: 102/79 (!) 105/54 (!) 105/54 101/62  Pulse: (!) 105 100 (!) 101 (!) 138  Resp: (!) 34 (!) 31 (!) 34 (!) 28  Temp:      TempSrc:      SpO2: 100% 100% 100% 100%  Weight:      Height:      PainSc:        Isolation Precautions No active isolations  Medications Medications  albuterol (PROVENTIL) (2.5 MG/3ML) 0.083% nebulizer solution 5 mg (5 mg Nebulization Given 08/24/17 1229)  methylPREDNISolone sodium succinate (SOLU-MEDROL) 125 mg/2 mL injection 125 mg (125 mg Intravenous Given 08/24/17 1522)  cefTRIAXone (ROCEPHIN) 1 g in sodium chloride 0.9 %  100 mL IVPB (0 g Intravenous Stopped 08/24/17 1645)  azithromycin (ZITHROMAX) 500 mg in sodium chloride 0.9 % 250 mL IVPB (0 mg Intravenous Stopped 08/24/17 1704)    Mobility walks with person assist

## 2017-08-25 DIAGNOSIS — J449 Chronic obstructive pulmonary disease, unspecified: Secondary | ICD-10-CM

## 2017-08-25 DIAGNOSIS — N183 Chronic kidney disease, stage 3 (moderate): Secondary | ICD-10-CM

## 2017-08-25 DIAGNOSIS — J9621 Acute and chronic respiratory failure with hypoxia: Secondary | ICD-10-CM

## 2017-08-25 LAB — CBC
HCT: 35.9 % — ABNORMAL LOW (ref 36.0–46.0)
Hemoglobin: 11.8 g/dL — ABNORMAL LOW (ref 12.0–15.0)
MCH: 30.1 pg (ref 26.0–34.0)
MCHC: 32.9 g/dL (ref 30.0–36.0)
MCV: 91.6 fL (ref 78.0–100.0)
Platelets: 323 K/uL (ref 150–400)
RBC: 3.92 MIL/uL (ref 3.87–5.11)
RDW: 14.1 % (ref 11.5–15.5)
WBC: 6.1 10*3/uL (ref 4.0–10.5)

## 2017-08-25 LAB — BASIC METABOLIC PANEL
BUN: 27 mg/dL — ABNORMAL HIGH (ref 6–20)
CO2: 21 mmol/L — ABNORMAL LOW (ref 22–32)
Chloride: 99 mmol/L — ABNORMAL LOW (ref 101–111)
GFR calc Af Amer: 26 mL/min — ABNORMAL LOW (ref >60)
Potassium: 3.8 mmol/L (ref 3.5–5.1)
Sodium: 135 mmol/L (ref 135–145)

## 2017-08-25 LAB — BASIC METABOLIC PANEL WITH GFR
Anion gap: 15 (ref 5–15)
Calcium: 8.4 mg/dL — ABNORMAL LOW (ref 8.9–10.3)
Creatinine, Ser: 1.9 mg/dL — ABNORMAL HIGH (ref 0.44–1.00)
GFR calc non Af Amer: 22 mL/min — ABNORMAL LOW (ref >60)
Glucose, Bld: 201 mg/dL — ABNORMAL HIGH (ref 65–99)

## 2017-08-25 MED ORDER — ADULT MULTIVITAMIN W/MINERALS CH
1.0000 | ORAL_TABLET | Freq: Every day | ORAL | Status: DC
  Administered 2017-08-25 – 2017-08-29 (×5): 1 via ORAL
  Filled 2017-08-25 (×4): qty 1

## 2017-08-25 MED ORDER — PREMIER PROTEIN SHAKE
11.0000 [oz_av] | ORAL | Status: DC
  Administered 2017-08-25 – 2017-08-26 (×2): 11 [oz_av] via ORAL
  Filled 2017-08-25 (×4): qty 325.31

## 2017-08-25 MED ORDER — IPRATROPIUM-ALBUTEROL 0.5-2.5 (3) MG/3ML IN SOLN: 3.0000 mL | Freq: Three times a day (TID) | RESPIRATORY_TRACT | Status: DC

## 2017-08-25 NOTE — Progress Notes (Signed)
PROGRESS NOTE  [name]:[id number] DOB: March 11, 1928 DOA: 08/24/2017 PCP: Coralyn Helling, MD  HPI/Recap of past 24 hours: [name] is a 84 y.o. female with medical history significant of paroxysmal atrial fibrillation/flutter, chronic diastolic heart failure with grade 2 diastolic dysfunction on echo on 01/13/2017, stage III CKD, hyperlipidemia, hypertension, chronic respiratory failure on 2 L chronically at rest due to Gold stage II COPD by PFTs on 04/17/2016 who comes in with shortness of breath.  Patient reports that for the past 2 weeks she has had cough, congestion, rhinorrhea.  She reports that initially the cough was worse at night however it improved somewhat.  She also began to develop worsening dyspnea on exertion and shortness of breath.  She could no longer even get up to go to the bathroom without becoming increasingly dyspneic.  Her family increased her to 3 L nasal cannula continue to have dyspnea.  She reports using her inhaled bronchodilators with some improvement in her symptoms.  She also had significant amounts of weakness, malaise, fatigue.  Yesterday she began having fevers to 102 orally.  She was unable to tolerate much more than crackers and small amounts of water.  She is also intermittently had some diffuse abdominal pain that is nonradiating.  It is intermittent and crampy.  She had some nonbilious nonbloody emesis and some diarrhea that has no blood in it.  She has not had any dysuria.  She has not had any chest pain exertional or otherwise.  She has not had any syncope or presyncope.  She was seen by her PCP today where her oxygen saturations were noted to be 70% on 3 L nasal cannula and she was sent to the emergency room.  ED Course: In the emergency room she was noted to be in respiratory distress.  She was placed on BiPAP.  Labs are notable for mild hypokalemia at 3.3.  Creatinine was 1.  9 5 which is around her baseline.  White count was 10.7.  She was given IV  antibiotics and IV steroids and bronchodilators.  08/25/17: she has no new complaints. Denies chest pain and dyspnea at rest.   Assessment/Plan: Active Problems:   PAF (paroxysmal atrial fibrillation) (HCC)   CKD (chronic kidney disease), stage III (HCC)   CHF (congestive heart failure) (HCC)   Hyperlipidemia   Chronic respiratory failure with hypoxia (HCC)   COPD (chronic obstructive pulmonary disease) (HCC)   COPD exacerbation (HCC)   Acute on chronic respiratory failure (HCC)   Essential hypertension   Acute hypoxemic respiratory failure (HCC)   CAP, poa Personally reviewed cxr which revealed lower lobes infiltrates C/w IV azithromycin  And IV ceftriaxone Monitor fever curve C/w breathing treatments  Acute exacerbation of COPD C/w IV antibiotics  C/w nebs C/w po steroid  Aflutter with RVR, resolved Back in sinus rhythm C/w diltiazem for rate control C/w eliquis for cva ppx  HLD C/w lipitor  OSA C/w CPAP at night    Code Status: DNR  Family Communication: none at bedside  Disposition Plan: home when clinically stable   Consultants:  none  Procedures:  none  Antimicrobials:  IV  Azithromycin  IV ceftriaxone  DVT prophylaxis:  eliquis   Objective: Vitals:   08/25/17 1600 08/25/17 1700 08/25/17 1800 08/25/17 1930  BP: (!) 125/96 115/66 133/84   Pulse:      Resp: (!) 31 (!) 25 (!) 25   Temp:  97.8 F (36.6 C)  (!) 97.5 F (36.4 C)  TempSrc:  Oral  Oral  SpO2: 100% 96% 98%   Weight:      Height:        Intake/Output Summary (Last 24 hours) at 08/25/2017 1935 Last data filed at 08/25/2017 1930 Gross per 24 hour  Intake 2866.25 ml  Output 800 ml  Net 2066.25 ml   Filed Weights   08/24/17 1208  Weight: 48.5 kg (107 lb)    Exam:  . General: 84 y.o. year-old female well developed well nourished in no acute distress.  Alert and oriented x3. . Cardiovascular: Regular rate and rhythm with no rubs or gallops.  No thyromegaly or JVD  noted.   Marland Kitchen Respiratory: Clear to auscultation with no wheezes or rales. Good inspiratory effort. . Abdomen: Soft nontender nondistended with normal bowel sounds x4 quadrants. . Musculoskeletal: No lower extremity edema. 2/4 pulses in all 4 extremities. . Skin: No ulcerative lesions noted or rashes, . Psychiatry: Mood is appropriate for condition and setting   Data Reviewed: CBC: Recent Labs  Lab 08/24/17 1211 08/25/17 0300  WBC 10.7* 6.1  NEUTROABS 8.6*  --   HGB 15.2* 11.8*  HCT 45.0 35.9*  MCV 91.3 91.6  PLT 417* 323   Basic Metabolic Panel: Recent Labs  Lab 08/24/17 1211 08/25/17 0300  NA 139 135  K 3.3* 3.8  CL 98* 99*  CO2 25 21*  GLUCOSE 132* 201*  BUN 27* 27*  CREATININE 1.95* 1.90*  CALCIUM 9.4 8.4*   GFR: Estimated Creatinine Clearance: 15.7 mL/min (A) (by C-G formula based on SCr of 1.9 mg/dL (H)). Liver Function Tests: Recent Labs  Lab 08/24/17 1211  AST 25  ALT 25  ALKPHOS 80  BILITOT 0.8  PROT 8.0  ALBUMIN 3.4*   No results for input(s): LIPASE, AMYLASE in the last 168 hours. No results for input(s): AMMONIA in the last 168 hours. Coagulation Profile: Recent Labs  Lab 08/24/17 1211  INR 1.42   Cardiac Enzymes: No results for input(s): CKTOTAL, CKMB, CKMBINDEX, TROPONINI in the last 168 hours. BNP (last 3 results) No results for input(s): PROBNP in the last 8760 hours. HbA1C: No results for input(s): HGBA1C in the last 72 hours. CBG: No results for input(s): GLUCAP in the last 168 hours. Lipid Profile: No results for input(s): CHOL, HDL, LDLCALC, TRIG, CHOLHDL, LDLDIRECT in the last 72 hours. Thyroid Function Tests: No results for input(s): TSH, T4TOTAL, FREET4, T3FREE, THYROIDAB in the last 72 hours. Anemia Panel: No results for input(s): VITAMINB12, FOLATE, FERRITIN, TIBC, IRON, RETICCTPCT in the last 72 hours. Urine analysis:    Component Value Date/Time   COLORURINE YELLOW 08/24/2017 1456   APPEARANCEUR CLEAR 08/24/2017 1456    LABSPEC 1.010 08/24/2017 1456   PHURINE 5.0 08/24/2017 1456   GLUCOSEU NEGATIVE 08/24/2017 1456   HGBUR SMALL (A) 08/24/2017 1456   BILIRUBINUR NEGATIVE 08/24/2017 1456   KETONESUR NEGATIVE 08/24/2017 1456   PROTEINUR NEGATIVE 08/24/2017 1456   NITRITE NEGATIVE 08/24/2017 1456   LEUKOCYTESUR MODERATE (A) 08/24/2017 1456   Sepsis Labs: (procalcitonin:4,lacticidven:4)  ) Recent Results (from the past 240 hour(s))  Culture, blood (Routine x 2)     Status: None (Preliminary result)   Collection Time: 08/24/17 12:11 PM  Result Value Ref Range Status   Specimen Description   Final    RIGHT ANTECUBITAL Performed at [facility], [address] [place] [address] [place], [place] [address]    Special Requests   Final    BOTTLES DRAWN AEROBIC AND ANAEROBIC Blood Culture adequate volume Performed at  [facility], [address], [place], [place] [address]    Culture   Final    NO GROWTH < 24 HOURS Performed at [facility], [address], [place], [place] [address]    Report Status PENDING  Incomplete  Culture, blood (Routine x 2)     Status: None (Preliminary result)   Collection Time: 08/24/17  1:00 PM  Result Value Ref Range Status   Specimen Description   Final    BLOOD LEFT ANTECUBITAL Performed at [facility], [address], [place], [place] [address]    Special Requests   Final    BOTTLES DRAWN AEROBIC AND ANAEROBIC Blood Culture adequate volume Performed at [facility], [address] [place], [place] [address]    Culture   Final    NO GROWTH < 24 HOURS Performed at [facility], [address] [place], [place] [address]    Report Status PENDING  Incomplete  MRSA PCR Screening     Status: None   Collection Time: 08/24/17  6:45 PM  Result Value Ref Range Status   MRSA by PCR NEGATIVE NEGATIVE Final    Comment:        The GeneXpert MRSA Assay (FDA approved for NASAL specimens only),  is one component of a comprehensive MRSA colonization surveillance program. It is not intended to diagnose MRSA infection nor to guide or monitor treatment for MRSA infections. Performed at [facility], [address] [place], [place] [address]       Studies: No results found.  Scheduled Meds: . apixaban  2.5 mg Oral BID  . atorvastatin  5 mg Oral Daily  . azithromycin  250 mg Oral Daily  . cholecalciferol  1,000 Units Oral BID  . diltiazem  360 mg Oral Daily  . latanoprost  1 drop Both Eyes QHS  . multivitamin with minerals  1 tablet Oral Daily  . predniSONE  40 mg Oral Q supper  . protein supplement shake  11 oz Oral Q24H  . umeclidinium-vilanterol  1 puff Inhalation Daily  . vitamin E  1,000 Units Oral BID    Continuous Infusions: . sodium chloride 75 mL/hr at 08/25/17 1016  . cefTRIAXone (ROCEPHIN)  IV Stopped (08/25/17 1728)     LOS: 1 day     Darlin Drop, MD [facility] Pager [phone]  If 7PM-7AM, please contact night-coverage [email or web address] Password [facility] 08/25/2017, 7:35 PM

## 2017-08-25 NOTE — Progress Notes (Signed)
Initial Nutrition Assessment  DOCUMENTATION CODES:   Non-severe (moderate) malnutrition in context of chronic illness  INTERVENTION:  - Will order Premier Protein once/day, this supplement provides 160 kcal and 30 grams of protein.  - Will order Magic Cup with dinner, this supplement provides 290 kcal and 9 grams of protein. - Will order daily multivitamin with minerals.  - Continue to encourage PO intakes.   NUTRITION DIAGNOSIS:   Moderate Malnutrition related to chronic illness(COPD) as evidenced by moderate muscle depletion, moderate fat depletion.  GOAL:   Patient will meet greater than or equal to 90% of their needs  MONITOR:   PO intake, Supplement acceptance, Weight trends, Labs  REASON FOR ASSESSMENT:   Malnutrition Screening Tool, Consult COPD Protocol  ASSESSMENT:   84 y.o. female with medical history significant of atrial fibrillation/flutter, diastolic CHF with grade 2 diastolic dysfunction, stage III CKD, hyperlipidemia, HTN, chronic respiratory failure on 2 L at home due to Gold Stage II COPD. She presented to the ED with SOB.  Patient reports that for the past 2 weeks she has had cough, congestion, rhinorrhea. Initially the cough was worse at night however it improved somewhat.  She also began to develop worsening dyspnea on exertion and SOB. She could no longer even get up to go to the bathroom without becoming increasingly dyspneic.  Her family increased her to 3 L nasal cannula but she continued to have dyspnea. She also had significant amounts of weakness, malaise, fatigue. She was unable to tolerate much more than crackers and small amounts of water. She has also intermittently had diffuse abdominal pain that is nonradiating.  It is intermittent and crampy. She had some nonbilious nonbloody emesis and some diarrhea. She was seen by her PCP where her oxygen saturations were noted to be 70% on 3 L nasal cannula and she was sent to the ED.  BMI indicates normal  weight. No intakes documented since admission. Pt had breakfast tray in front of her and was about to start eating at the time of RD visit; RD kept visit short to allow pt to eat while meal was still warm and to avoid her becoming SOB prior to eating. Pt confirms SOB PTA that was ongoing and worsening for 2-3 weeks. She confirms that it was difficult to eat during that time. RD was unable to inquire about N/V/D and abdominal pain which was noted in H&P. Pt very appreciative of RD stating plan to visit at another time so that she could eat. Pt reports breathing and ability to eat is much improved since admission.       NFPE outlined below. Per chart review, pt has lost 4 lbs (3.6% body weight) in the past 3.5 months. This is not significant for time frame, but unable to determine at this time if weight loss occurred more acutely.   Medications reviewed; 1000 units vitamin D BID, 40 mg Solu-medrol QID with 40 mg Deltasone/day, 1000 units vitamin E BID.  Labs reviewed; Cl: 99 mmol/L, BUN: 27 mg/dL, creatinine: 1.9 mg/dL, Ca: 8.4 mg/dL, GFR: 22 mL/min.  IVF: NS @ 75 mL/hr.         NUTRITION - FOCUSED PHYSICAL EXAM:    Most Recent Value  Orbital Region  Mild depletion  Upper Arm Region  Moderate depletion  Thoracic and Lumbar Region  Unable to assess  Buccal Region  Mild depletion  Temple Region  Mild depletion  Clavicle Bone Region  Moderate depletion  Clavicle and Acromion Bone Region  Moderate  depletion  Scapular Bone Region  Unable to assess  Dorsal Hand  Mild depletion  Patellar Region  Unable to assess  Anterior Thigh Region  Unable to assess  Posterior Calf Region  Unable to assess  Edema (RD Assessment)  Unable to assess  Hair  Reviewed  Eyes  Reviewed  Mouth  Reviewed  Skin  Reviewed  Nails  Reviewed       Diet Order:   Diet Order           Diet Heart Room service appropriate? Yes; Fluid consistency: Thin  Diet effective now          EDUCATION NEEDS:   No education  needs have been identified at this time  Skin:  Skin Assessment: Reviewed RN Assessment  Last BM:  5/20  Height:   Ht Readings from Last 1 Encounters:  08/24/17  (1.575 m)    Weight:   Wt Readings from Last 1 Encounters:  08/24/17 107 lb (48.5 kg)    Ideal Body Weight:  50 kg  BMI:  Body mass index is 19.57 kg/m.  Estimated Nutritional Needs:   Kcal:  1360-1555 (28-32 kcal/kg)  Protein:  58-68 grams (1.2-1.4 grams/kg)  Fluid:  >/= 1.5 L/day     Trenton Gammon, MS, RD, LDN, [facility] Inpatient Clinical Dietitian Pager # [phone] After hours/weekend pager # [phone]

## 2017-08-26 ENCOUNTER — Inpatient Hospital Stay (HOSPITAL_COMMUNITY): Payer: Medicare Other

## 2017-08-26 DIAGNOSIS — I480 Paroxysmal atrial fibrillation: Secondary | ICD-10-CM

## 2017-08-26 DIAGNOSIS — J441 Chronic obstructive pulmonary disease with (acute) exacerbation: Principal | ICD-10-CM

## 2017-08-26 DIAGNOSIS — I5032 Chronic diastolic (congestive) heart failure: Secondary | ICD-10-CM

## 2017-08-26 DIAGNOSIS — I361 Nonrheumatic tricuspid (valve) insufficiency: Secondary | ICD-10-CM

## 2017-08-26 DIAGNOSIS — I10 Essential (primary) hypertension: Secondary | ICD-10-CM

## 2017-08-26 LAB — BASIC METABOLIC PANEL
ANION GAP: 13 (ref 5–15)
BUN: 40 mg/dL — ABNORMAL HIGH (ref 6–20)
CALCIUM: 8.7 mg/dL — AB (ref 8.9–10.3)
CO2: 20 mmol/L — ABNORMAL LOW (ref 22–32)
CREATININE: 1.49 mg/dL — AB (ref 0.44–1.00)
Chloride: 108 mmol/L (ref 101–111)
GFR, EST AFRICAN AMERICAN: 35 mL/min — AB (ref >60)
GFR, EST NON AFRICAN AMERICAN: 30 mL/min — AB (ref >60)
Glucose, Bld: 156 mg/dL — ABNORMAL HIGH (ref 65–99)
Potassium: 3.6 mmol/L (ref 3.5–5.1)
Sodium: 141 mmol/L (ref 135–145)

## 2017-08-26 LAB — CBC
HCT: 35.5 % — ABNORMAL LOW (ref 36.0–46.0)
Hemoglobin: 11.7 g/dL — ABNORMAL LOW (ref 12.0–15.0)
MCH: 29.8 pg (ref 26.0–34.0)
MCHC: 33 g/dL (ref 30.0–36.0)
MCV: 90.3 fL (ref 78.0–100.0)
PLATELETS: 417 10*3/uL — AB (ref 150–400)
RBC: 3.93 MIL/uL (ref 3.87–5.11)
RDW: 14.2 % (ref 11.5–15.5)
WBC: 19.1 10*3/uL — AB (ref 4.0–10.5)

## 2017-08-26 LAB — ECHOCARDIOGRAM COMPLETE
HEIGHTINCHES: 62 in
Weight: 1712 oz

## 2017-08-26 MED ORDER — ORAL CARE MOUTH RINSE
15.0000 mL | Freq: Two times a day (BID) | OROMUCOSAL | Status: DC
  Administered 2017-08-26 – 2017-08-28 (×5): 15 mL via OROMUCOSAL

## 2017-08-26 MED ORDER — VITAMIN E 180 MG (400 UNIT) PO CAPS
400.0000 [IU] | ORAL_CAPSULE | Freq: Two times a day (BID) | ORAL | Status: DC
  Administered 2017-08-26 – 2017-08-29 (×6): 400 [IU] via ORAL
  Filled 2017-08-26 (×8): qty 1

## 2017-08-26 MED ORDER — VITAMIN D3 25 MCG (1000 UNIT) PO TABS
1000.0000 [IU] | ORAL_TABLET | Freq: Every day | ORAL | Status: DC
  Administered 2017-08-26 – 2017-08-28 (×3): 1000 [IU] via ORAL
  Filled 2017-08-26 (×3): qty 1

## 2017-08-26 MED ORDER — IPRATROPIUM-ALBUTEROL 0.5-2.5 (3) MG/3ML IN SOLN: 3.0000 mL | RESPIRATORY_TRACT | Status: DC | PRN

## 2017-08-26 MED ORDER — IPRATROPIUM-ALBUTEROL 0.5-2.5 (3) MG/3ML IN SOLN
3.0000 mL | Freq: Four times a day (QID) | RESPIRATORY_TRACT | Status: DC
  Administered 2017-08-26 – 2017-08-27 (×3): 3 mL via RESPIRATORY_TRACT
  Filled 2017-08-26 (×3): qty 3

## 2017-08-26 NOTE — Progress Notes (Addendum)
PROGRESS NOTE    [name]  ZOX:[id number] DOB: Jun 04, 1927 DOA: 08/24/2017 PCP: Coralyn Helling, MD    Brief Narrative:  84 year old female who presented with dyspnea. She does have a significant past medical history for paroxysmal atrial fibrillation, chronic diastolic heart failure, stage III chronic kidney disease, dyslipidemia, hypertension and chronic hypoxic respiratory failure due to COPD. Reported 2 weeks of worsening dyspnea, that point where she was dyspneic with minimal efforts, associated with cough, congestion, rhinorrhea and fever. On the initial physical examination she was noted to be in respiratory distress and was placed on noninvasive mechanical ventilation. Blood pressure 106/75, heart rate 105, respiratory rate 34, oxygen saturation 98-100% (on BiPAP). Dry mucous membranes, increased work of breathing, decreased breath sounds bilaterally, scattered rhonchi, distant heart sounds, S1-S2 present, abdomen soft and nontender, no lower extremity edema. Sodium 139, potassium 3.3, chloride 98, bicarbonate 25, glucose 132, BUN 27, creatinine 1.95, BNP 789, white count 10.7, hemoglobin 15.2, hematocrit 45.0, platelets 417. Urinalysis specific gravity 1.0 10, white cells 11-20. Hyaline casts. Chest x-ray with hyperinflation, positive emphysematous changes. EKG with atrial fibrillation, right axis deviation.  Patient was admitted to the hospital with working diagnosis of acute hypoxic respiratory failure due to COPD exacerbation.  Assessment & Plan:   Active Problems:   PAF (paroxysmal atrial fibrillation) (HCC)   CKD (chronic kidney disease), stage III (HCC)   CHF (congestive heart failure) (HCC)   Hyperlipidemia   Chronic respiratory failure with hypoxia (HCC)   COPD (chronic obstructive pulmonary disease) (HCC)   COPD exacerbation (HCC)   Acute on chronic respiratory failure (HCC)   Essential hypertension   Acute hypoxemic respiratory failure (HCC)  1. Acute hypoxic  respiratory failure due to COPD exacerbation. Improved dyspnea but not back to baseline, will continue oxymetry monitoring and supplemental 02 per [place], to target 02 saturation greater than 92%. Continue systemic steroids and aggressive bronchodilator therapy. Out of bed tid with meals and physical therapy evaluation. Personally reviewed chest film no features of pneumonia, will dc ceftriaxone and will continue azithromycin. WBC up to 19 suspected to be reactive to systemic steroids.   2. Chronic diastolic heart failure. Clinically euvolemic, will continue telemetry monitoring, blood pressure control. Follow on echocardiogram.   3. Paroxysmal atrial fibrillation. Continue rate control diltiazem and anticoagulation with apixaban, continue telemetry monitoring.   4. Hypertension. Continue blood pressure control diltiazem, systolic blood pressure 99 to 811 mmHg.   5. Chronic kidney disease stage III. Renal function stable with serum cr at 1,49 with K at 3,6 and serum bicarbonate at 20, follow on renal panel in am, avoid hypotension or nephrotoxic medications.   6. Moderate malnutrition. Continue nutritional supplements.    DVT prophylaxis:  Code Status: dnr  Family Communication: no family at the bedside  Disposition Plan: pending physical therapy evaluation    Consultants:     Procedures:     Antimicrobials:       Subjective: Patient is feeling better but not back to her baseline, positive generalized malaise, intermittent cough, no nausea or vomiting, no chest pain.   Objective: Vitals:   08/26/17 0313 08/26/17 0400 08/26/17 0500 08/26/17 0600  BP:  106/64  91/62  Pulse:      Resp:  (!) 24 13 (!) 36  Temp: 97.7 F (36.5 C)     TempSrc: Oral     SpO2:  92% 94% 93%  Weight:      Height:        Intake/Output Summary (Last 24 hours)  at 08/26/2017 9147 Last data filed at 08/26/2017 0600 Gross per 24 hour  Intake 2732.5 ml  Output 1100 ml  Net 1632.5 ml   Filed Weights    08/24/17 1208  Weight: 48.5 kg (107 lb)    Examination:   General: deconditioned  Neurology: Awake and alert, non focal  E ENT: mild pallor, no icterus, oral mucosa moist Cardiovascular: No JVD. S1-S2 present, rhythmic, no gallops, rubs, or murmurs. No lower extremity edema. Pulmonary: decreased breath sounds bilaterally, decreased air movement, no wheezing, or rhonchi, scattered rales. Gastrointestinal. Abdomen with no organomegaly, non tender, no rebound or guarding Skin. No rashes Musculoskeletal: no joint deformities     Data Reviewed: I have personally reviewed following labs and imaging studies  CBC: Recent Labs  Lab 08/24/17 1211 08/25/17 0300 08/26/17 0319  WBC 10.7* 6.1 19.1*  NEUTROABS 8.6*  --   --   HGB 15.2* 11.8* 11.7*  HCT 45.0 35.9* 35.5*  MCV 91.3 91.6 90.3  PLT 417* 323 417*   Basic Metabolic Panel: Recent Labs  Lab 08/24/17 1211 08/25/17 0300 08/26/17 0319  NA 139 135 141  K 3.3* 3.8 3.6  CL 98* 99* 108  CO2 25 21* 20*  GLUCOSE 132* 201* 156*  BUN 27* 27* 40*  CREATININE 1.95* 1.90* 1.49*  CALCIUM 9.4 8.4* 8.7*   GFR: Estimated Creatinine Clearance: 20 mL/min (A) (by C-G formula based on SCr of 1.49 mg/dL (H)). Liver Function Tests: Recent Labs  Lab 08/24/17 1211  AST 25  ALT 25  ALKPHOS 80  BILITOT 0.8  PROT 8.0  ALBUMIN 3.4*   No results for input(s): LIPASE, AMYLASE in the last 168 hours. No results for input(s): AMMONIA in the last 168 hours. Coagulation Profile: Recent Labs  Lab 08/24/17 1211  INR 1.42   Cardiac Enzymes: No results for input(s): CKTOTAL, CKMB, CKMBINDEX, TROPONINI in the last 168 hours. BNP (last 3 results) No results for input(s): PROBNP in the last 8760 hours. HbA1C: No results for input(s): HGBA1C in the last 72 hours. CBG: No results for input(s): GLUCAP in the last 168 hours. Lipid Profile: No results for input(s): CHOL, HDL, LDLCALC, TRIG, CHOLHDL, LDLDIRECT in the last 72 hours. Thyroid  Function Tests: No results for input(s): TSH, T4TOTAL, FREET4, T3FREE, THYROIDAB in the last 72 hours. Anemia Panel: No results for input(s): VITAMINB12, FOLATE, FERRITIN, TIBC, IRON, RETICCTPCT in the last 72 hours.    Radiology Studies: I have reviewed all of the imaging during this hospital visit personally     Scheduled Meds: . apixaban  2.5 mg Oral BID  . atorvastatin  5 mg Oral Daily  . azithromycin  250 mg Oral Daily  . cholecalciferol  1,000 Units Oral BID  . diltiazem  360 mg Oral Daily  . latanoprost  1 drop Both Eyes QHS  . mouth rinse  15 mL Mouth Rinse BID  . multivitamin with minerals  1 tablet Oral Daily  . predniSONE  40 mg Oral Q supper  . protein supplement shake  11 oz Oral Q24H  . umeclidinium-vilanterol  1 puff Inhalation Daily  . vitamin E  1,000 Units Oral BID   Continuous Infusions: . sodium chloride 75 mL/hr at 08/25/17 1016  . cefTRIAXone (ROCEPHIN)  IV Stopped (08/25/17 1728)     LOS: 2 days        Mauricio Annett Gula, MD [facility] Pager [phone]

## 2017-08-26 NOTE — Progress Notes (Signed)
  Echocardiogram 2D Echocardiogram has been performed.  [name] F 08/26/2017, 3:14 PM

## 2017-08-27 ENCOUNTER — Encounter (HOSPITAL_COMMUNITY): Payer: Self-pay | Admitting: *Deleted

## 2017-08-27 ENCOUNTER — Other Ambulatory Visit: Payer: Self-pay

## 2017-08-27 DIAGNOSIS — J9611 Chronic respiratory failure with hypoxia: Secondary | ICD-10-CM

## 2017-08-27 LAB — BASIC METABOLIC PANEL
Anion gap: 16 — ABNORMAL HIGH (ref 5–15)
BUN: 50 mg/dL — ABNORMAL HIGH (ref 6–20)
CHLORIDE: 106 mmol/L (ref 101–111)
CO2: 19 mmol/L — ABNORMAL LOW (ref 22–32)
Calcium: 9 mg/dL (ref 8.9–10.3)
Creatinine, Ser: 1.6 mg/dL — ABNORMAL HIGH (ref 0.44–1.00)
GFR calc non Af Amer: 28 mL/min — ABNORMAL LOW (ref >60)
GFR, EST AFRICAN AMERICAN: 32 mL/min — AB (ref >60)
GLUCOSE: 166 mg/dL — AB (ref 65–99)
POTASSIUM: 3.6 mmol/L (ref 3.5–5.1)
Sodium: 141 mmol/L (ref 135–145)

## 2017-08-27 LAB — CBC WITH DIFFERENTIAL/PLATELET
BASOS ABS: 0 10*3/uL (ref 0.0–0.1)
Basophils Relative: 0 %
EOS ABS: 0 10*3/uL (ref 0.0–0.7)
EOS PCT: 0 %
HCT: 34.5 % — ABNORMAL LOW (ref 36.0–46.0)
HEMOGLOBIN: 11.3 g/dL — AB (ref 12.0–15.0)
LYMPHS ABS: 0.3 10*3/uL — AB (ref 0.7–4.0)
LYMPHS PCT: 1 %
MCH: 29.7 pg (ref 26.0–34.0)
MCHC: 32.8 g/dL (ref 30.0–36.0)
MCV: 90.6 fL (ref 78.0–100.0)
MONOS PCT: 4 %
Monocytes Absolute: 0.8 10*3/uL (ref 0.1–1.0)
NEUTROS PCT: 95 %
Neutro Abs: 18.9 10*3/uL — ABNORMAL HIGH (ref 1.7–7.7)
Platelets: 448 10*3/uL — ABNORMAL HIGH (ref 150–400)
RBC: 3.81 MIL/uL — AB (ref 3.87–5.11)
RDW: 14.7 % (ref 11.5–15.5)
WBC: 20 10*3/uL — ABNORMAL HIGH (ref 4.0–10.5)

## 2017-08-27 MED ORDER — METHYLPREDNISOLONE SODIUM SUCC 125 MG IJ SOLR
60.0000 mg | Freq: Every day | INTRAMUSCULAR | Status: DC
  Administered 2017-08-27 – 2017-08-29 (×3): 60 mg via INTRAVENOUS
  Filled 2017-08-27 (×3): qty 2

## 2017-08-27 MED ORDER — PREMIER PROTEIN SHAKE
325.0000 mL | ORAL | Status: DC
  Administered 2017-08-27 – 2017-08-28 (×2): 325 mL via ORAL
  Filled 2017-08-27 (×4): qty 325.31

## 2017-08-27 MED ORDER — IPRATROPIUM-ALBUTEROL 0.5-2.5 (3) MG/3ML IN SOLN
3.0000 mL | Freq: Three times a day (TID) | RESPIRATORY_TRACT | Status: DC
  Administered 2017-08-27 – 2017-08-28 (×5): 3 mL via RESPIRATORY_TRACT
  Filled 2017-08-27 (×6): qty 3

## 2017-08-27 NOTE — Progress Notes (Signed)
PROGRESS NOTE    [name]  GNF:[id number] DOB: 10-25-1927 DOA: 08/24/2017 PCP: Coralyn Helling, MD    Brief Narrative:  84 year old female who presented with dyspnea. She does have a significant past medical history for paroxysmal atrial fibrillation, chronic diastolic heart failure, stage III chronic kidney disease, dyslipidemia, hypertension and chronic hypoxic respiratory failure due to COPD. Reported 2 weeks of worsening dyspnea, that point where she was dyspneic with minimal efforts, associated with cough, congestion, rhinorrhea and fever. On the initial physical examination she was noted to be in respiratory distress and was placed on noninvasive mechanical ventilation. Blood pressure 106/75, heart rate 105, respiratory rate 34, oxygen saturation 98-100% (on BiPAP). Dry mucous membranes, increased work of breathing, decreased breath sounds bilaterally, scattered rhonchi, distant heart sounds, S1-S2 present, abdomen soft and nontender, no lower extremity edema. Sodium 139, potassium 3.3, chloride 98, bicarbonate 25, glucose 132, BUN 27, creatinine 1.95, BNP 789, white count 10.7, hemoglobin 15.2, hematocrit 45.0, platelets 417. Urinalysis specific gravity 1.0 10, white cells 11-20. Hyaline casts. Chest x-ray with hyperinflation, positive emphysematous changes. EKG with atrial fibrillation, right axis deviation.  Patient was admitted to the hospital with working diagnosis of acute hypoxic respiratory failure due to COPD exacerbation.  Assessment & Plan:   Active Problems:   PAF (paroxysmal atrial fibrillation) (HCC)   CKD (chronic kidney disease), stage III (HCC)   CHF (congestive heart failure) (HCC)   Hyperlipidemia   Chronic respiratory failure with hypoxia (HCC)   COPD (chronic obstructive pulmonary disease) (HCC)   COPD exacerbation (HCC)   Acute on chronic respiratory failure (HCC)   Essential hypertension   Acute hypoxemic respiratory failure (HCC)  1. Acute hypoxic  respiratory failure due to COPD exacerbation.  - continue current regimen, will place on solumedrol and discontinue prednisone, continue duoneb, and antibiotic  2. Chronic diastolic heart failure.  - Clinically euvolemic, will continue telemetry monitoring, blood pressure control. Follow on echocardiogram.   3. Paroxysmal atrial fibrillation.  - Rate controled on diltiazem - anticoagulation with apixaban.  4. Hypertension.  - Continue diltiazem  5. Chronic kidney disease stage III. Stable currently, pt producing urine  6. Moderate malnutrition. Continue nutritional supplements.    DVT prophylaxis: Eliquis  Code Status: dnr  Family Communication: no family at the bedside  Disposition Plan: pending physical therapy evaluation    Consultants:   none  Procedures:     Antimicrobials:    Azithromycin   Subjective: Patient has no new complaints.  Objective: Vitals:   08/27/17 0140 08/27/17 0520 08/27/17 0847 08/27/17 1338  BP:  115/71  95/80  Pulse:  68  76  Resp:  18  18  Temp:  (!) 97.4 F (36.3 C)    TempSrc:  Oral    SpO2: 94% 100% 95% 100%  Weight:      Height:        Intake/Output Summary (Last 24 hours) at 08/27/2017 1531 Last data filed at 08/27/2017 1338 Gross per 24 hour  Intake 960 ml  Output 203 ml  Net 757 ml   Filed Weights   08/24/17 1208 08/26/17 1720  Weight: 48.5 kg (107 lb) 50.8 kg (112 lb)    Examination:   General: deconditioned, in nad.  Neurology: Awake and alert, non focal  E ENT: mild pallor, no icterus, oral mucosa moist Cardiovascular: No JVD. S1-S2 present, rhythmic, no gallops, rubs, or murmurs. No lower extremity edema. Pulmonary: decreased breath sounds bilaterally, decreased air movement, no wheezing, or rhonchi, scattered rales. Equal  chest rise. Gastrointestinal. Abdomen with no organomegaly, non tender, no rebound or guarding Skin. No rashes Musculoskeletal: no joint deformities   Data Reviewed: I have personally  reviewed following labs and imaging studies  CBC: Recent Labs  Lab 08/24/17 1211 08/25/17 0300 08/26/17 0319 08/27/17 0524  WBC 10.7* 6.1 19.1* 20.0*  NEUTROABS 8.6*  --   --  18.9*  HGB 15.2* 11.8* 11.7* 11.3*  HCT 45.0 35.9* 35.5* 34.5*  MCV 91.3 91.6 90.3 90.6  PLT 417* 323 417* 448*   Basic Metabolic Panel: Recent Labs  Lab 08/24/17 1211 08/25/17 0300 08/26/17 0319 08/27/17 0524  NA 139 135 141 141  K 3.3* 3.8 3.6 3.6  CL 98* 99* 108 106  CO2 25 21* 20* 19*  GLUCOSE 132* 201* 156* 166*  BUN 27* 27* 40* 50*  CREATININE 1.95* 1.90* 1.49* 1.60*  CALCIUM 9.4 8.4* 8.7* 9.0   GFR: Estimated Creatinine Clearance: 19.5 mL/min (A) (by C-G formula based on SCr of 1.6 mg/dL (H)). Liver Function Tests: Recent Labs  Lab 08/24/17 1211  AST 25  ALT 25  ALKPHOS 80  BILITOT 0.8  PROT 8.0  ALBUMIN 3.4*   No results for input(s): LIPASE, AMYLASE in the last 168 hours. No results for input(s): AMMONIA in the last 168 hours. Coagulation Profile: Recent Labs  Lab 08/24/17 1211  INR 1.42   Cardiac Enzymes: No results for input(s): CKTOTAL, CKMB, CKMBINDEX, TROPONINI in the last 168 hours. BNP (last 3 results) No results for input(s): PROBNP in the last 8760 hours. HbA1C: No results for input(s): HGBA1C in the last 72 hours. CBG: No results for input(s): GLUCAP in the last 168 hours. Lipid Profile: No results for input(s): CHOL, HDL, LDLCALC, TRIG, CHOLHDL, LDLDIRECT in the last 72 hours. Thyroid Function Tests: No results for input(s): TSH, T4TOTAL, FREET4, T3FREE, THYROIDAB in the last 72 hours. Anemia Panel: No results for input(s): VITAMINB12, FOLATE, FERRITIN, TIBC, IRON, RETICCTPCT in the last 72 hours.  Radiology Studies: I have reviewed all of the imaging during this hospital visit personally  Scheduled Meds: . apixaban  2.5 mg Oral BID  . atorvastatin  5 mg Oral Daily  . azithromycin  250 mg Oral Daily  . cholecalciferol  1,000 Units Oral QHS  .  diltiazem  360 mg Oral Daily  . ipratropium-albuterol  3 mL Nebulization TID  . latanoprost  1 drop Both Eyes QHS  . mouth rinse  15 mL Mouth Rinse BID  . multivitamin with minerals  1 tablet Oral Daily  . predniSONE  40 mg Oral Q supper  . protein supplement shake  325 mL Oral Q24H  . vitamin E  400 Units Oral BID   Continuous Infusions:    LOS: 3 days    Penny Pia, MD [facility] Pager [phone]

## 2017-08-27 NOTE — Care Management Note (Signed)
Case Management Note  Patient Details  Name: [name] MRN: [id number] Date of Birth: 04-23-1927  Subjective/Objective:PT recc SNF. Patient from [facility]. Spoke to patient/dtr in rm about d/c plan-they agree to SNF-CSW notified.                    Action/Plan:d/c SNF.   Expected Discharge Date:  (unknown)               Expected Discharge Plan:  Skilled Nursing Facility  In-House Referral:  Clinical Social Work  Discharge planning Services  CM Consult  Post Acute Care Choice:    Choice offered to:     DME Arranged:    DME Agency:     HH Arranged:    HH Agency:     Status of Service:  In process, will continue to follow  If discussed at Long Length of Stay Meetings, dates discussed:    Additional Comments:  Lanier Clam, RN 08/27/2017, 2:02 PM

## 2017-08-27 NOTE — Evaluation (Signed)
Physical Therapy Evaluation Patient Details Name: [name] MRN: [id number] DOB: [birth year] Today's Date: 08/27/2017   History of Present Illness  84 yo female admitted with PAF. Hx of COPD-O2 dep, CHF, Afib, Aflutter, gait d/o, CKD, postural dizziness, chronic pain.     Clinical Impression  On eval, pt required Min assist for mobility. She was able to walk ~60 feet x 2 with a rollator. Distance is limited by dyspnea and general weakness. O2 readings: 93% on 3L [place] O2 at rest, 83% on 4L [place] O2 during ambulation dyspnea 4/4, 94% on 3L [place] O2 end of session. Pt presents with general weakness, decreased activity tolerance, and impaired gait and balance. Pt is from [facility]. Discussed d/c plan-pt mentioned ALF. She is agreeable to a short rehab stay at [facility] as well, if possible. Will follow and progress activity as tolerated.     Follow Up Recommendations SNF (pt also mentioned ALF???)     Equipment Recommendations  None recommended by PT    Recommendations for Other Services       Precautions / Restrictions Precautions Precautions: Fall Precaution Comments: O2 dep. Monitor O2 sats.  Restrictions Weight Bearing Restrictions: No      Mobility  Bed Mobility Overal bed mobility: Needs Assistance Bed Mobility: Supine to Sit     Supine to sit: Supervision     General bed mobility comments: for safety, lines.   Transfers Overall transfer level: Needs assistance Equipment used: 4-wheeled walker Transfers: Sit to/from Stand Sit to Stand: Min assist         General transfer comment: Assist to rise, stabilize, control descent VCs safety, hand placement, safe operation of rollator  Ambulation/Gait Ambulation/Gait assistance: Min assist Ambulation Distance (Feet): 60 Feet(x2) Assistive device: 4-wheeled walker Gait Pattern/deviations: Step-through pattern;Drifts right/left;Staggering right;Staggering left     General Gait Details: Assist to  stabilize pt througout distance. O2 sat 83% on 4L [place] O2, dyspnea 4/4. Seated reat break needed/taken. Cues for pursed lip breathing.  ~4-5 mintues for O2 level to recover to 90%  Stairs            Wheelchair Mobility    Modified Rankin (Stroke Patients Only)       Balance Overall balance assessment: Needs assistance         Standing balance support: Bilateral upper extremity supported Standing balance-Leahy Scale: Poor                               Pertinent Vitals/Pain Pain Assessment: No/denies pain    Home Living Family/patient expects to be discharged to:: Private residence Living Arrangements: Alone   Type of Home: Independent living facility([facility]) Home Access: Level entry     Home Layout: One level Home Equipment: Other (comment)(3 wheeled walker)      Prior Function Level of Independence: Independent with assistive device(s)         Comments: able to ambulate to/from dining room with walker     Hand Dominance        Extremity/Trunk Assessment   Upper Extremity Assessment Upper Extremity Assessment: Generalized weakness    Lower Extremity Assessment Lower Extremity Assessment: Generalized weakness    Cervical / Trunk Assessment Cervical / Trunk Assessment: Kyphotic  Communication   Communication: HOH  Cognition Arousal/Alertness: Awake/alert Behavior During Therapy: WFL for tasks assessed/performed Overall Cognitive Status: Within Functional Limits for tasks assessed  General Comments      Exercises     Assessment/Plan    PT Assessment Patient needs continued PT services  PT Problem List Decreased balance;Decreased strength;Decreased mobility;Decreased activity tolerance;Decreased knowledge of use of DME;Cardiopulmonary status limiting activity       PT Treatment Interventions DME instruction;Gait training;Functional mobility training;Therapeutic  activities;Balance training;Patient/family education;Therapeutic exercise;Stair training    PT Goals (Current goals can be found in the Care Plan section)  Acute Rehab PT Goals Patient Stated Goal: to feel better PT Goal Formulation: With patient Time For Goal Achievement: 09/10/17 Potential to Achieve Goals: Good    Frequency Min 3X/week   Barriers to discharge        Co-evaluation               AM-PAC PT "6 Clicks" Daily Activity  Outcome Measure Difficulty turning over in bed (including adjusting bedclothes, sheets and blankets)?: None Difficulty moving from lying on back to sitting on the side of the bed? : None Difficulty sitting down on and standing up from a chair with arms (e.g., wheelchair, bedside commode, etc,.)?: Unable Help needed moving to and from a bed to chair (including a wheelchair)?: A Little Help needed walking in hospital room?: A Little Help needed climbing 3-5 steps with a railing? : A Lot 6 Click Score: 17    End of Session Equipment Utilized During Treatment: Gait belt;Oxygen Activity Tolerance: Patient limited by fatigue(limited by dyspnea) Patient left: in chair;with call bell/phone within reach;with chair alarm set   PT Visit Diagnosis: Muscle weakness (generalized) (M62.81);Difficulty in walking, not elsewhere classified (R26.2)    Time: 1610-9604 PT Time Calculation (min) (ACUTE ONLY): 24 min   Charges:   PT Evaluation $PT Eval Moderate Complexity: 1 Mod PT Treatments $Gait Training: 8-22 mins   PT G Codes:          Rebeca Alert, MPT Pager: [phone]

## 2017-08-28 LAB — BLOOD CULTURE ID PANEL (REFLEXED)
ACINETOBACTER BAUMANNII: NOT DETECTED
CANDIDA ALBICANS: NOT DETECTED
CANDIDA KRUSEI: NOT DETECTED
CANDIDA PARAPSILOSIS: NOT DETECTED
CANDIDA TROPICALIS: NOT DETECTED
Candida glabrata: NOT DETECTED
ENTEROBACTERIACEAE SPECIES: NOT DETECTED
ESCHERICHIA COLI: NOT DETECTED
Enterobacter cloacae complex: NOT DETECTED
Enterococcus species: NOT DETECTED
Haemophilus influenzae: NOT DETECTED
KLEBSIELLA OXYTOCA: NOT DETECTED
Klebsiella pneumoniae: NOT DETECTED
Listeria monocytogenes: NOT DETECTED
Neisseria meningitidis: NOT DETECTED
PROTEUS SPECIES: NOT DETECTED
Pseudomonas aeruginosa: NOT DETECTED
STAPHYLOCOCCUS SPECIES: NOT DETECTED
STREPTOCOCCUS AGALACTIAE: NOT DETECTED
Serratia marcescens: NOT DETECTED
Staphylococcus aureus (BCID): NOT DETECTED
Streptococcus pneumoniae: NOT DETECTED
Streptococcus pyogenes: NOT DETECTED
Streptococcus species: NOT DETECTED

## 2017-08-28 MED ORDER — IPRATROPIUM-ALBUTEROL 0.5-2.5 (3) MG/3ML IN SOLN
3.0000 mL | Freq: Two times a day (BID) | RESPIRATORY_TRACT | Status: DC
  Administered 2017-08-29: 3 mL via RESPIRATORY_TRACT
  Filled 2017-08-28 (×2): qty 3

## 2017-08-28 NOTE — Progress Notes (Signed)
PROGRESS NOTE    [name]  WUJ:[id number] DOB: 03/03/1928 DOA: 08/24/2017 PCP: Coralyn Helling, MD    Brief Narrative:  84 year old female who presented with dyspnea. She does have a significant past medical history for paroxysmal atrial fibrillation, chronic diastolic heart failure, stage III chronic kidney disease, dyslipidemia, hypertension and chronic hypoxic respiratory failure due to COPD. Reported 2 weeks of worsening dyspnea, that point where she was dyspneic with minimal efforts, associated with cough, congestion, rhinorrhea and fever. On the initial physical examination she was noted to be in respiratory distress and was placed on noninvasive mechanical ventilation. Blood pressure 106/75, heart rate 105, respiratory rate 34, oxygen saturation 98-100% (on BiPAP). Dry mucous membranes, increased work of breathing, decreased breath sounds bilaterally, scattered rhonchi, distant heart sounds, S1-S2 present, abdomen soft and nontender, no lower extremity edema. Sodium 139, potassium 3.3, chloride 98, bicarbonate 25, glucose 132, BUN 27, creatinine 1.95, BNP 789, white count 10.7, hemoglobin 15.2, hematocrit 45.0, platelets 417. Urinalysis specific gravity 1.0 10, white cells 11-20. Hyaline casts. Chest x-ray with hyperinflation, positive emphysematous changes. EKG with atrial fibrillation, right axis deviation.  Patient was admitted to the hospital with working diagnosis of acute hypoxic respiratory failure due to COPD exacerbation.  Assessment & Plan:   Active Problems:   PAF (paroxysmal atrial fibrillation) (HCC)   CKD (chronic kidney disease), stage III (HCC)   CHF (congestive heart failure) (HCC)   Hyperlipidemia   Chronic respiratory failure with hypoxia (HCC)   COPD (chronic obstructive pulmonary disease) (HCC)   COPD exacerbation (HCC)   Acute on chronic respiratory failure (HCC)   Essential hypertension   Acute hypoxemic respiratory failure (HCC)  1. Acute hypoxic  respiratory failure due to COPD exacerbation.  - continue current regimen, improving on solumedrol.  Planning on discharging next a.m. on oral prednisone.   2. Chronic diastolic heart failure.  -Euvolemic, echocardiogram results reviewed with daughter.  EF 60 to 65%  3. Paroxysmal atrial fibrillation.  - Rate controled on diltiazem - anticoagulation with apixaban.  4. Hypertension.  - Continue diltiazem  5. Chronic kidney disease stage III. Stable currently, pt producing urine  6. Moderate malnutrition. Continue nutritional supplements.    DVT prophylaxis: Eliquis  Code Status: dnr  Family Communication: no family at the bedside, discussed with daughter however Disposition Plan: Discussed disposition with daughter who is requesting another day of hospitalization for discharge planning   Consultants:   none  Procedures:   Echo: EF 60-65%  Antimicrobials:    Azithromycin   Subjective: Patient has no new complaints.  Objective: Vitals:   08/28/17 0538 08/28/17 0916 08/28/17 1349 08/28/17 1448  BP: 102/67  97/62   Pulse: 69  70   Resp:   20   Temp: 97.7 F (36.5 C)  (!) 97.5 F (36.4 C)   TempSrc: Oral  Oral   SpO2: 93% 97%  94%  Weight:      Height:        Intake/Output Summary (Last 24 hours) at 08/28/2017 1621 Last data filed at 08/28/2017 1354 Gross per 24 hour  Intake 240 ml  Output 1 ml  Net 239 ml   Filed Weights   08/24/17 1208 08/26/17 1720  Weight: 48.5 kg (107 lb) 50.8 kg (112 lb)    Examination:   General: awake and alert, in nad.  Neurology: Awake and alert, non focal  E ENT: mild pallor, no icterus, oral mucosa moist Cardiovascular: No JVD. S1-S2 present, rhythmic, no gallops, rubs, or murmurs. No  lower extremity edema. Pulmonary: decreased breath sounds bilaterally, decreased air movement, no wheezing, or rhonchi, scattered rales. Equal chest rise. Gastrointestinal. Abdomen with no organomegaly, non tender, no rebound or  guarding Skin. No rashes Musculoskeletal: no joint deformities   Data Reviewed: I have personally reviewed following labs and imaging studies  CBC: Recent Labs  Lab 08/24/17 1211 08/25/17 0300 08/26/17 0319 08/27/17 0524  WBC 10.7* 6.1 19.1* 20.0*  NEUTROABS 8.6*  --   --  18.9*  HGB 15.2* 11.8* 11.7* 11.3*  HCT 45.0 35.9* 35.5* 34.5*  MCV 91.3 91.6 90.3 90.6  PLT 417* 323 417* 448*   Basic Metabolic Panel: Recent Labs  Lab 08/24/17 1211 08/25/17 0300 08/26/17 0319 08/27/17 0524  NA 139 135 141 141  K 3.3* 3.8 3.6 3.6  CL 98* 99* 108 106  CO2 25 21* 20* 19*  GLUCOSE 132* 201* 156* 166*  BUN 27* 27* 40* 50*  CREATININE 1.95* 1.90* 1.49* 1.60*  CALCIUM 9.4 8.4* 8.7* 9.0   GFR: Estimated Creatinine Clearance: 19.5 mL/min (A) (by C-G formula based on SCr of 1.6 mg/dL (H)). Liver Function Tests: Recent Labs  Lab 08/24/17 1211  AST 25  ALT 25  ALKPHOS 80  BILITOT 0.8  PROT 8.0  ALBUMIN 3.4*   No results for input(s): LIPASE, AMYLASE in the last 168 hours. No results for input(s): AMMONIA in the last 168 hours. Coagulation Profile: Recent Labs  Lab 08/24/17 1211  INR 1.42   Cardiac Enzymes: No results for input(s): CKTOTAL, CKMB, CKMBINDEX, TROPONINI in the last 168 hours. BNP (last 3 results) No results for input(s): PROBNP in the last 8760 hours. HbA1C: No results for input(s): HGBA1C in the last 72 hours. CBG: No results for input(s): GLUCAP in the last 168 hours. Lipid Profile: No results for input(s): CHOL, HDL, LDLCALC, TRIG, CHOLHDL, LDLDIRECT in the last 72 hours. Thyroid Function Tests: No results for input(s): TSH, T4TOTAL, FREET4, T3FREE, THYROIDAB in the last 72 hours. Anemia Panel: No results for input(s): VITAMINB12, FOLATE, FERRITIN, TIBC, IRON, RETICCTPCT in the last 72 hours.  Radiology Studies: I have reviewed all of the imaging during this hospital visit personally  Scheduled Meds: . apixaban  2.5 mg Oral BID  . atorvastatin  5  mg Oral Daily  . cholecalciferol  1,000 Units Oral QHS  . diltiazem  360 mg Oral Daily  . ipratropium-albuterol  3 mL Nebulization TID  . latanoprost  1 drop Both Eyes QHS  . mouth rinse  15 mL Mouth Rinse BID  . methylPREDNISolone (SOLU-MEDROL) injection  60 mg Intravenous Daily  . multivitamin with minerals  1 tablet Oral Daily  . protein supplement shake  325 mL Oral Q24H  . vitamin E  400 Units Oral BID   Continuous Infusions:    LOS: 4 days    Penny Pia, MD [facility] Pager [phone]

## 2017-08-28 NOTE — Clinical Social Work Note (Signed)
Clinical Social Work Assessment  Patient Details  Name: [name] MRN: [id number] Date of Birth: 1927-10-13  Date of referral:  08/28/17               Reason for consult:  Facility Placement                Permission sought to share information with:  Case Manager, Magazine features editor, Family Supports Permission granted to share information::  Yes, Verbal Permission Granted  Name::     Administrator, sports::  [facility]  Relationship::  daughters  Contact Information:     Housing/Transportation Living arrangements for the past 2 months:  Independent Dealer of Information:  Patient, Adult Children Patient Interpreter Needed:  None Criminal Activity/Legal Involvement Pertinent to Current Situation/Hospitalization:    Significant Relationships:  Adult Children, Merchandiser, retail Lives with:  Facility Resident Do you feel safe going back to the place where you live?  Yes Need for family participation in patient care:  Yes (Comment)  Care giving concerns:  No care giving conerns at the time of assessment.    Social Worker assessment / plan:  LCSW consulted for facility placement.   Patient from [facility] independent living. PT recommending SNF at dc for rehab. Patient will go to SNF at dc.   Patient reports that she has been in independent living for about a year. Patient reports that she uses a walker at baseline. Patient is independent in ADLs. Patient states she cooks meals, but will have one meal a day in the dinning hall.   PLAN: Patient will go to SNF at dc.   Employment status:  Retired Health and safety inspector:  Medicare PT Recommendations:  Skilled Nursing Facility Information / Referral to community resources:     Patient/Family's Response to care:  Patient and family thankful for LCSW visit.   Patient/Family's Understanding of and Emotional Response to Diagnosis, Current Treatment, and Prognosis:  Patient and family are realistic  about patient care. Patient and family are agreeable to SNF at DC.   Emotional Assessment Appearance:  Appears stated age Attitude/Demeanor/Rapport:    Affect (typically observed):  Accepting, Calm, Pleasant Orientation:  Oriented to Self, Oriented to Place, Oriented to  Time, Oriented to Situation Alcohol / Substance use:  Not Applicable Psych involvement (Current and /or in the community):  No (Comment)  Discharge Needs  Concerns to be addressed:  No discharge needs identified Readmission within the last 30 days:  No Current discharge risk:  None Barriers to Discharge:  Continued Medical Work up   [employer], LCSW 08/28/2017, 12:23 PM

## 2017-08-28 NOTE — Progress Notes (Signed)
Physical Therapy Treatment Patient Details Name: [name] MRN: [id number] DOB: 05-13-1927 Today's Date: 08/28/2017    History of Present Illness 84 yo female admitted with PAF. Hx of COPD-O2 dep, CHF, Afib, Aflutter, gait d/o, CKD, postural dizziness, chronic pain.     PT Comments    Pt c/o feeling more fatigued and sleepy on today- "I just don't feel right." She was agreeable to mobilize. She is on more O2 today at rest than she was on yesterday. She is less steady today than she was last session. At rest, O2 95% 4L [place] O2, during ambulation: 85% 6L [place] O2, end of session: 91% 4L [place] O2. O2 level dropped to 88% on 4L with minimal activity (getting to EOB) so increased O2 to 6L for ambulation. Assisted pt back to bed at end of session (per her request). Continue to recommend SNF.     Follow Up Recommendations  SNF     Equipment Recommendations  None recommended by PT    Recommendations for Other Services       Precautions / Restrictions Precautions Precautions: Fall Precaution Comments: O2 dep. Monitor O2 sats.  Restrictions Weight Bearing Restrictions: No    Mobility  Bed Mobility Overal bed mobility: Needs Assistance Bed Mobility: Supine to Sit;Sit to Supine     Supine to sit: Supervision Sit to supine: Supervision   General bed mobility comments: for safety, lines.   Transfers Overall transfer level: Needs assistance Equipment used: 4-wheeled walker Transfers: Sit to/from Stand Sit to Stand: Min assist         General transfer comment: Assist to rise, stabilize, control descent VCs safety, hand placement, safe operation of rollator. Pt seems less steady on today. Increased time required.   Ambulation/Gait Ambulation/Gait assistance: Min assist Ambulation Distance (Feet): 60 Feet(x2) Assistive device: 4-wheeled walker Gait Pattern/deviations: Step-through pattern;Drifts right/left;Staggering right;Staggering left     General Gait Details: Assist to  stabilize pt througout distance. O2 sat 85% on 6L [place] O2, dyspnea 4/4. Seated reat break needed/taken. Cues for pursed lip breathing.     Stairs             Wheelchair Mobility    Modified Rankin (Stroke Patients Only)       Balance Overall balance assessment: Needs assistance         Standing balance support: Bilateral upper extremity supported Standing balance-Leahy Scale: Poor                              Cognition Arousal/Alertness: Awake/alert Behavior During Therapy: WFL for tasks assessed/performed Overall Cognitive Status: Within Functional Limits for tasks assessed                                        Exercises      General Comments        Pertinent Vitals/Pain Pain Assessment: No/denies pain    Home Living                      Prior Function            PT Goals (current goals can now be found in the care plan section) Progress towards PT goals: Progressing toward goals    Frequency    Min 3X/week      PT Plan Current plan remains appropriate  Co-evaluation              AM-PAC PT "6 Clicks" Daily Activity  Outcome Measure  Difficulty turning over in bed (including adjusting bedclothes, sheets and blankets)?: A Little Difficulty moving from lying on back to sitting on the side of the bed? : A Little Difficulty sitting down on and standing up from a chair with arms (e.g., wheelchair, bedside commode, etc,.)?: Unable Help needed moving to and from a bed to chair (including a wheelchair)?: A Little Help needed walking in hospital room?: A Little Help needed climbing 3-5 steps with a railing? : A Lot 6 Click Score: 15    End of Session Equipment Utilized During Treatment: Gait belt;Oxygen Activity Tolerance: Patient limited by fatigue Patient left: in bed;with call bell/phone within reach;with bed alarm set   PT Visit Diagnosis: Muscle weakness (generalized) (M62.81);Difficulty in  walking, not elsewhere classified (R26.2)     Time: 3086-5784 PT Time Calculation (min) (ACUTE ONLY): 26 min  Charges:  $Gait Training: 23-37 mins                    G Codes:          Rebeca Alert, MPT Pager: [phone] '

## 2017-08-28 NOTE — NC FL2 (Signed)
[place] MEDICAID FL2 LEVEL OF CARE SCREENING TOOL     IDENTIFICATION  Patient Name: [name] Birthdate: July 29, 1927 Sex: female Admission Date (Current Location): 08/24/2017  [facility] and [place] Number:  Producer, television/film/video and Address:  [facility],  [address], [place] [address]      Provider Number: [id number]  Attending Physician Name and Address:  Penny Pia, MD  Relative Name and Phone Number:       Current Level of Care: Hospital Recommended Level of Care: Skilled Nursing Facility Prior Approval Number:    Date Approved/Denied: 08/28/17 PASRR Number: [id number]  Discharge Plan: SNF    Current Diagnoses: Patient Active Problem List   Diagnosis Date Noted  . Acute hypoxemic respiratory failure (HCC) 08/24/2017  . Postural dizziness with presyncope   . Essential hypertension   . Acute on chronic respiratory failure (HCC) 01/01/2017  . COPD exacerbation (HCC) 09/05/2016  . Malnutrition of moderate degree 07/31/2016  . CRI (chronic renal insufficiency), stage 4 (severe) (HCC)   . Acute on chronic combined systolic and diastolic CHF (congestive heart failure) (HCC)   . Respiratory distress 07/29/2016  . Acute respiratory distress 07/29/2016  . CAD (coronary artery disease) 07/29/2016  . Chronic musculoskeletal pain 07/29/2016  . COPD without exacerbation (HCC) 07/29/2016  . Acute diastolic (congestive) heart failure (HCC)   . Chronic respiratory failure with hypoxia (HCC) 05/09/2016  . COPD (chronic obstructive pulmonary disease) (HCC) 05/09/2016  . Thrush, oral 05/02/2016  . Dyspnea 04/25/2016  . Gait disorder   . Weakness   . CKD (chronic kidney disease), stage III (HCC)   . Protein-calorie malnutrition (HCC)   . Multinodular goiter   . PAOD (peripheral arterial occlusive disease) (HCC)   . Chronic back pain   . CHF (congestive heart failure) (HCC)   . Atrial flutter (HCC)   . Edema   . Glaucoma   . Hyperlipidemia   . PMR  (polymyalgia rheumatica) (HCC)   . CAP (community acquired pneumonia)   . PAF (paroxysmal atrial fibrillation) (HCC) 04/14/2016  . Chronic anticoagulation 04/14/2016  . Diverticulosis of colon (without mention of hemorrhage) 07/15/2013  . Arthropathy 04/06/2007  . ESOPHAGITIS 03/29/2007    Orientation RESPIRATION BLADDER Height & Weight     Self, Time, Situation, Place  O2 Continent Weight: 112 lb (50.8 kg) Height:   (160 cm)  BEHAVIORAL SYMPTOMS/MOOD NEUROLOGICAL BOWEL NUTRITION STATUS      Continent Diet(see dc summary)  AMBULATORY STATUS COMMUNICATION OF NEEDS Skin   Extensive Assist   Normal                       Personal Care Assistance Level of Assistance  Bathing, Feeding, Dressing Bathing Assistance: Limited assistance Feeding assistance: Independent Dressing Assistance: Limited assistance     Functional Limitations Info  Sight, Hearing, Speech Sight Info: Adequate Hearing Info: Adequate Speech Info: Adequate    SPECIAL CARE FACTORS FREQUENCY  PT (By licensed PT), OT (By licensed OT)     PT Frequency: 5x/week OT Frequency: 5x/week            Contractures Contractures Info: Not present    Additional Factors Info  Code Status, Allergies Code Status Info: DNR Allergies Info: Codeine Sulfate           Current Medications (08/28/2017):  This is the current hospital active medication list Current Facility-Administered Medications  Medication Dose Route Frequency Provider Last Rate Last Dose  . acetaminophen (TYLENOL) tablet  650 mg  650 mg Oral Q6H PRN Purohit, Shrey C, MD       Or  . acetaminophen (TYLENOL) suppository 650 mg  650 mg Rectal Q6H PRN Purohit, Shrey C, MD      . apixaban (ELIQUIS) tablet 2.5 mg  2.5 mg Oral BID Purohit, Shrey C, MD   2.5 mg at 08/27/17 2216  . atorvastatin (LIPITOR) tablet 5 mg  5 mg Oral Daily Purohit, Shrey C, MD   5 mg at 08/27/17 0939  . azithromycin (ZITHROMAX) tablet 250 mg  250 mg Oral Daily Purohit, Shrey  C, MD   250 mg at 08/27/17 0940  . cholecalciferol (VITAMIN D) tablet 1,000 Units  1,000 Units Oral QHS Danford Bad, RPH   1,000 Units at 08/27/17 2215  . diltiazem (CARDIZEM CD) 24 hr capsule 360 mg  360 mg Oral Daily Purohit, Shrey C, MD   360 mg at 08/27/17 1606  . ipratropium-albuterol (DUONEB) 0.5-2.5 (3) MG/3ML nebulizer solution 3 mL  3 mL Nebulization Q2H PRN Arrien, York Ram, MD      . ipratropium-albuterol (DUONEB) 0.5-2.5 (3) MG/3ML nebulizer solution 3 mL  3 mL Nebulization TID Penny Pia, MD   3 mL at 08/28/17 0916  . latanoprost (XALATAN) 0.005 % ophthalmic solution 1 drop  1 drop Both Eyes QHS Purohit, Shrey C, MD   1 drop at 08/27/17 2215  . MEDLINE mouth rinse  15 mL Mouth Rinse BID Dow Adolph N, DO   15 mL at 08/27/17 2215  . methylPREDNISolone sodium succinate (SOLU-MEDROL) 125 mg/2 mL injection 60 mg  60 mg Intravenous Daily Penny Pia, MD   60 mg at 08/27/17 1607  . multivitamin with minerals tablet 1 tablet  1 tablet Oral Daily Dow Adolph N, DO   1 tablet at 08/27/17 0940  . naphazoline-glycerin (CLEAR EYES REDNESS) ophth solution 1-2 drop  1-2 drop Both Eyes QID PRN Purohit, Shrey C, MD      . ondansetron (ZOFRAN) tablet 4 mg  4 mg Oral Q6H PRN Purohit, Shrey C, MD       Or  . ondansetron (ZOFRAN) injection 4 mg  4 mg Intravenous Q6H PRN Purohit, Shrey C, MD      . polyethylene glycol (MIRALAX / GLYCOLAX) packet 17 g  17 g Oral Daily PRN Purohit, Shrey C, MD      . protein supplement (PREMIER PROTEIN) liquid  325 mL Oral Q24H Penny Pia, MD   325 mL at 08/27/17 1427  . traMADol (ULTRAM) tablet 50 mg  50 mg Oral Q6H PRN Purohit, Salli Quarry, MD   50 mg at 08/27/17 0538  . vitamin E capsule 400 Units  400 Units Oral BID Danford Bad, RPH   400 Units at 08/27/17 2216     Discharge Medications: Please see discharge summary for a list of discharge medications.  Relevant Imaging Results:  Relevant Lab Results:   Additional Information SSN:  [id number]  Coralyn Helling, LCSW

## 2017-08-28 NOTE — Progress Notes (Signed)
PHARMACY - PHYSICIAN COMMUNICATION CRITICAL VALUE ALERT - BLOOD CULTURE IDENTIFICATION (BCID)  [name] is an 84 y.o. female who presented to [facility] on 08/24/2017 with a chief complaint of dyspnea  Assessment:  Patient with COPD exacerbation  Name of physician (or Provider) Contacted: Dr. Cena Benton  Current antibiotics: none  Changes to prescribed antibiotics recommended:  No antibiotics required, likely contaminant  Results for orders placed or performed during the hospital encounter of 08/24/17  Blood Culture ID Panel (Reflexed) (Collected: 08/24/2017  1:00 PM)  Result Value Ref Range   Enterococcus species NOT DETECTED NOT DETECTED   Listeria monocytogenes NOT DETECTED NOT DETECTED   Staphylococcus species NOT DETECTED NOT DETECTED   Staphylococcus aureus NOT DETECTED NOT DETECTED   Streptococcus species NOT DETECTED NOT DETECTED   Streptococcus agalactiae NOT DETECTED NOT DETECTED   Streptococcus pneumoniae NOT DETECTED NOT DETECTED   Streptococcus pyogenes NOT DETECTED NOT DETECTED   Acinetobacter baumannii NOT DETECTED NOT DETECTED   Enterobacteriaceae species NOT DETECTED NOT DETECTED   Enterobacter cloacae complex NOT DETECTED NOT DETECTED   Escherichia coli NOT DETECTED NOT DETECTED   Klebsiella oxytoca NOT DETECTED NOT DETECTED   Klebsiella pneumoniae NOT DETECTED NOT DETECTED   Proteus species NOT DETECTED NOT DETECTED   Serratia marcescens NOT DETECTED NOT DETECTED   Haemophilus influenzae NOT DETECTED NOT DETECTED   Neisseria meningitidis NOT DETECTED NOT DETECTED   Pseudomonas aeruginosa NOT DETECTED NOT DETECTED   Candida albicans NOT DETECTED NOT DETECTED   Candida glabrata NOT DETECTED NOT DETECTED   Candida krusei NOT DETECTED NOT DETECTED   Candida parapsilosis NOT DETECTED NOT DETECTED   Candida tropicalis NOT DETECTED NOT DETECTED   Juliette Alcide, PharmD, BCPS.   08/28/2017 11:35 AM

## 2017-08-29 LAB — CULTURE, BLOOD (ROUTINE X 2)
Culture: NO GROWTH
SPECIAL REQUESTS: ADEQUATE

## 2017-08-29 MED ORDER — DOXYCYCLINE HYCLATE 100 MG PO CAPS: 100.0000 mg | ORAL_CAPSULE | Freq: Two times a day (BID) | ORAL | 0 refills | Status: AC

## 2017-08-29 NOTE — Discharge Summary (Signed)
Physician Discharge Summary  [name] ZOX:[id number] DOB: Jul 03, 1927 DOA: 08/24/2017  PCP: Coralyn Helling, MD  Admit date: 08/24/2017 Discharge date: 08/29/2017  Time spent: 35 minutes  Recommendations for Outpatient Follow-up:  1.    Discharge Diagnoses:  Active Problems:   PAF (paroxysmal atrial fibrillation) (HCC)   CKD (chronic kidney disease), stage III (HCC)   CHF (congestive heart failure) (HCC)   Hyperlipidemia   Chronic respiratory failure with hypoxia (HCC)   COPD (chronic obstructive pulmonary disease) (HCC)   COPD exacerbation (HCC)   Acute on chronic respiratory failure (HCC)   Essential hypertension   Acute hypoxemic respiratory failure (HCC)   Discharge Condition: stable  Diet recommendation: regular diet  Filed Weights   08/24/17 1208 08/26/17 1720  Weight: 48.5 kg (107 lb) 50.8 kg (112 lb)    History of present illness:  84 year old female who presented with dyspnea. She does have a significant past medical history for paroxysmal atrial fibrillation, chronic diastolic heart failure, stage III chronic kidney disease, dyslipidemia, hypertension and chronic hypoxic respiratory failure due to COPD. Reported 2 weeks of worsening dyspnea, that point where she was dyspneic with minimal efforts, associated with cough, congestion, rhinorrhea and fever.  Patient was admitted to the hospital with working diagnosis of acute hypoxic respiratory failure due to COPD exacerbation.  Hospital Course:   1. Acute hypoxic respiratory failure due to COPD exacerbation.  - Pt completed 5 days of steroid, will discontinue steroids on d/c as patient has no wheezes on exam. Will d/c on doxycycline for 2 more days to complete a 7 day treatment course.   2. Chronic diastolic heart failure.  -Euvolemic, echocardiogram results reviewed with daughter.  EF 60 to 65%  3. Paroxysmal atrial fibrillation.  - Rate controled on diltiazem - anticoagulation with apixaban.  4.  Hypertension.  - Continue diltiazem  5. Chronic kidney disease stage III. Stable currently, pt producing urine  6. Moderate malnutrition. Continue nutritional supplements.    Procedures:  none  Consultations:  None  Discharge Exam: Vitals:   08/29/17 0606 08/29/17 0807  BP: 109/66   Pulse: 63   Resp: 18   Temp: 97.6 F (36.4 C)   SpO2: 98% 92%    General: Pt in nad, alert and awake Cardiovascular: rrr, no rubs Respiratory: no increased wob, no wheezes  Discharge Instructions   Discharge Instructions    Call MD for:  difficulty breathing, headache or visual disturbances   Complete by:  As directed    Call MD for:  temperature >100.4   Complete by:  As directed    Diet - low sodium heart healthy   Complete by:  As directed    Discharge instructions   Complete by:  As directed    Please follow-up with your primary care physician for further evaluation recommendations.   Increase activity slowly   Complete by:  As directed      Allergies as of 08/29/2017      Reactions   Codeine Sulfate Nausea And Vomiting, Other (See Comments)   headache      Medication List    STOP taking these medications   INCRUSE ELLIPTA 62.5 MCG/INH Aepb Generic drug:  umeclidinium bromide   predniSONE 10 MG tablet Commonly known as:  DELTASONE   TYLENOL 500 MG tablet Generic drug:  acetaminophen     TAKE these medications   albuterol 108 (90 Base) MCG/ACT inhaler Commonly known as:  PROAIR HFA Inhale 2 puffs into the lungs every 6 (six)  hours as needed for wheezing or shortness of breath.   albuterol (2.5 MG/3ML) 0.083% nebulizer solution Commonly known as:  PROVENTIL Take 3 mLs (2.5 mg total) by nebulization every 6 (six) hours as needed for wheezing or shortness of breath. DX: J44.9   atorvastatin 10 MG tablet Commonly known as:  LIPITOR Take 5 mg by mouth daily.   benzonatate 100 MG capsule Commonly known as:  TESSALON Take 1 capsule (100 mg total) by mouth 3  (three) times daily as needed for cough.   bimatoprost 0.01 % Soln Commonly known as:  LUMIGAN Place 1 drop into both eyes at bedtime.   cholecalciferol 1000 units tablet Commonly known as:  VITAMIN D Take 1,000 Units by mouth at bedtime.   diltiazem 360 MG 24 hr capsule Commonly known as:  CARDIZEM CD Take 1 capsule (360 mg total) by mouth daily.   doxycycline 100 MG capsule Commonly known as:  VIBRAMYCIN Take 1 capsule (100 mg total) by mouth 2 (two) times daily for 2 days. Start taking on:  08/30/2017   ELIQUIS 2.5 MG Tabs tablet Generic drug:  apixaban Take 2.5 mg by mouth 2 (two) times daily.   feeding supplement (ENSURE ENLIVE) Liqd Take 237 mLs by mouth 2 (two) times daily between meals.   furosemide 20 MG tablet Commonly known as:  LASIX Take 1 tablet (20 mg total) by mouth daily.   OXYGEN Inhale 2 L into the lungs daily.   potassium chloride 10 MEQ tablet Commonly known as:  K-DUR,KLOR-CON Take 10 mEq by mouth 2 (two) times daily.   tetrahydrozoline 0.05 % ophthalmic solution Place 1 drop into both eyes 4 (four) times daily as needed (For dry eyes.).   umeclidinium-vilanterol 62.5-25 MCG/INH Aepb Commonly known as:  ANORO ELLIPTA Inhale 1 puff into the lungs daily.   vitamin E 1000 UNIT capsule Take 500 Units by mouth 2 (two) times daily.      Allergies  Allergen Reactions  . Codeine Sulfate Nausea And Vomiting and Other (See Comments)    headache      The results of significant diagnostics from this hospitalization (including imaging, microbiology, ancillary and laboratory) are listed below for reference.    Significant Diagnostic Studies: Dg Chest 2 View  Result Date: 08/24/2017 CLINICAL DATA:  Dyspnea EXAM: CHEST - 2 VIEW COMPARISON:  01/12/2017 chest radiograph. FINDINGS: Stable cardiomediastinal silhouette with normal heart size. No pneumothorax. No pleural effusion. Mildly hyperinflated lungs. Emphysema. No pulmonary edema. Mild chronic  bibasilar scarring. No acute consolidative airspace disease. IMPRESSION: No acute cardiopulmonary disease. Mildly hyperinflated lungs and emphysema, suggesting COPD. Mild chronic bibasilar scarring. Electronically Signed   By: Delbert Phenix M.D.   On: 08/24/2017 13:28    Microbiology: Recent Results (from the past 240 hour(s))  Culture, blood (Routine x 2)     Status: None   Collection Time: 08/24/17 12:11 PM  Result Value Ref Range Status   Specimen Description   Final    RIGHT ANTECUBITAL Performed at [facility], [address], [place], [place] [address]    Special Requests   Final    BOTTLES DRAWN AEROBIC AND ANAEROBIC Blood Culture adequate volume Performed at [facility], [address] [place], [place] [address]    Culture   Final    NO GROWTH 5 DAYS Performed at [facility], [address] [place], [place] [address]    Report Status 08/29/2017 FINAL  Final  Culture, blood (Routine x 2)  Status: Abnormal (Preliminary result)   Collection Time: 08/24/17  1:00 PM  Result Value Ref Range Status   Specimen Description   Final    BLOOD LEFT ANTECUBITAL Performed at [facility], [address], [place], [place] [address]    Special Requests   Final    BOTTLES DRAWN AEROBIC AND ANAEROBIC Blood Culture adequate volume Performed at [facility], [address], [place], [place] [address]    Culture  Setup Time   Final    GRAM POSITIVE COCCI IN CLUSTERS AEROBIC BOTTLE ONLY CRITICAL RESULT CALLED TO, READ BACK BY AND VERIFIED WITH: M SWAINE,PHARMD AT 0932 08/28/17 BY L BENFIELD    Culture (A)  Final    MICROCOCCUS SPECIES Standardized susceptibility testing for this organism is not available. Performed at [facility], [address] [place], [place] [address]    Report Status PENDING  Incomplete  Blood Culture ID Panel (Reflexed)     Status: None   Collection Time: 08/24/17  1:00  PM  Result Value Ref Range Status   Enterococcus species NOT DETECTED NOT DETECTED Final   Listeria monocytogenes NOT DETECTED NOT DETECTED Final   Staphylococcus species NOT DETECTED NOT DETECTED Final   Staphylococcus aureus NOT DETECTED NOT DETECTED Final   Streptococcus species NOT DETECTED NOT DETECTED Final   Streptococcus agalactiae NOT DETECTED NOT DETECTED Final   Streptococcus pneumoniae NOT DETECTED NOT DETECTED Final   Streptococcus pyogenes NOT DETECTED NOT DETECTED Final   Acinetobacter baumannii NOT DETECTED NOT DETECTED Final   Enterobacteriaceae species NOT DETECTED NOT DETECTED Final   Enterobacter cloacae complex NOT DETECTED NOT DETECTED Final   Escherichia coli NOT DETECTED NOT DETECTED Final   Klebsiella oxytoca NOT DETECTED NOT DETECTED Final   Klebsiella pneumoniae NOT DETECTED NOT DETECTED Final   Proteus species NOT DETECTED NOT DETECTED Final   Serratia marcescens NOT DETECTED NOT DETECTED Final   Haemophilus influenzae NOT DETECTED NOT DETECTED Final   Neisseria meningitidis NOT DETECTED NOT DETECTED Final   Pseudomonas aeruginosa NOT DETECTED NOT DETECTED Final   Candida albicans NOT DETECTED NOT DETECTED Final   Candida glabrata NOT DETECTED NOT DETECTED Final   Candida krusei NOT DETECTED NOT DETECTED Final   Candida parapsilosis NOT DETECTED NOT DETECTED Final   Candida tropicalis NOT DETECTED NOT DETECTED Final    Comment: Performed at [facility], [address], [place], [place] [address]  MRSA PCR Screening     Status: None   Collection Time: 08/24/17  6:45 PM  Result Value Ref Range Status   MRSA by PCR NEGATIVE NEGATIVE Final    Comment:        The GeneXpert MRSA Assay (FDA approved for NASAL specimens only), is one component of a comprehensive MRSA colonization surveillance program. It is not intended to diagnose MRSA infection nor to guide or monitor treatment for MRSA infections. Performed at [facility], [address], [place], [place] [address]      Labs: Basic Metabolic Panel: Recent Labs  Lab 08/24/17 1211 08/25/17 0300 08/26/17 0319 08/27/17 0524  NA 139 135 141 141  K 3.3* 3.8 3.6 3.6  CL 98* 99* 108 106  CO2 25 21* 20* 19*  GLUCOSE 132* 201* 156* 166*  BUN 27* 27* 40* 50*  CREATININE 1.95* 1.90* 1.49* 1.60*  CALCIUM 9.4 8.4* 8.7* 9.0   Liver Function Tests: Recent Labs  Lab 08/24/17 1211  AST 25  ALT 25  ALKPHOS 80  BILITOT 0.8  PROT 8.0  ALBUMIN 3.4*   No results for input(s): LIPASE, AMYLASE in the last 168 hours. No results for input(s): AMMONIA in the last 168 hours. CBC: Recent Labs  Lab 08/24/17 1211 08/25/17 0300 08/26/17 0319 08/27/17 0524  WBC 10.7* 6.1 19.1* 20.0*  NEUTROABS 8.6*  --   --  18.9*  HGB 15.2* 11.8* 11.7* 11.3*  HCT 45.0 35.9* 35.5* 34.5*  MCV 91.3 91.6 90.3 90.6  PLT 417* 323 417* 448*   Cardiac Enzymes: No results for input(s): CKTOTAL, CKMB, CKMBINDEX, TROPONINI in the last 168 hours. BNP: BNP (last 3 results) Recent Labs    09/05/16 1919 01/12/17 1507 08/24/17 1211  BNP 416.5* 193.4* 789.5*    ProBNP (last 3 results) No results for input(s): PROBNP in the last 8760 hours.  CBG: No results for input(s): GLUCAP in the last 168 hours.   Signed:  Penny Pia MD.  [facility] 08/29/2017, 2:39 PM

## 2017-08-29 NOTE — Progress Notes (Signed)
Pt returning to [facility]- admitting to SNF building report [phone] ex [phone] (pt lives in independent living apartment)  DC information provided to facility.  Confirmed with RN facility prepared for pt's admission. Will arrange ptar transportation. Family at bedside aware of plan.  Ilean Skill, MSW, LCSW Clinical Social Work 08/29/2017 [phone] weekend coverage

## 2017-08-30 LAB — CULTURE, BLOOD (ROUTINE X 2): SPECIAL REQUESTS: ADEQUATE

## 2017-09-02 ENCOUNTER — Encounter: Payer: Self-pay | Admitting: Internal Medicine

## 2017-09-02 ENCOUNTER — Non-Acute Institutional Stay (SKILLED_NURSING_FACILITY): Payer: Medicare Other | Admitting: Internal Medicine

## 2017-09-02 DIAGNOSIS — D72829 Elevated white blood cell count, unspecified: Secondary | ICD-10-CM | POA: Diagnosis not present

## 2017-09-02 DIAGNOSIS — J449 Chronic obstructive pulmonary disease, unspecified: Secondary | ICD-10-CM

## 2017-09-02 DIAGNOSIS — I480 Paroxysmal atrial fibrillation: Secondary | ICD-10-CM | POA: Diagnosis not present

## 2017-09-02 DIAGNOSIS — R5381 Other malaise: Secondary | ICD-10-CM

## 2017-09-02 DIAGNOSIS — J9611 Chronic respiratory failure with hypoxia: Secondary | ICD-10-CM

## 2017-09-02 DIAGNOSIS — I5032 Chronic diastolic (congestive) heart failure: Secondary | ICD-10-CM | POA: Diagnosis not present

## 2017-09-02 DIAGNOSIS — N184 Chronic kidney disease, stage 4 (severe): Secondary | ICD-10-CM

## 2017-09-02 NOTE — Progress Notes (Signed)
Provider:  Oneal Grout MD  Location:  [facility] Room Number: 39 Place of Service:  SNF (31)  PCP: Coralyn Helling, MD Patient Care Team: Coralyn Helling, MD as PCP - General (Pulmonary Disease) Kimber Relic, MD as Consulting Physician (Internal Medicine)  Extended Emergency Contact Information Primary Emergency Contact: Norm Parcel [place] of [place] Phone: [phone] Relation: Daughter Secondary Emergency Contact: Bernette Redbird [place] of [place] Mobile Phone: [phone] Relation: Daughter  Code Status: DNR  Goals of Care: Advanced Directive information Advanced Directives 09/02/2017  Does Patient Have a Medical Advance Directive? Yes  Type of Estate agent of [place];Living will;Out of facility DNR (pink MOST or yellow form)  Does patient want to make changes to medical advance directive? No - Patient declined  Copy of Healthcare Power of Attorney in Chart? Yes  Would patient like information on creating a medical advance directive? -  Pre-existing out of facility DNR order (yellow form or pink MOST form) Yellow form placed in chart (order not valid for inpatient use)      Chief Complaint  Patient presents with  . New Admit To SNF    New Admission Visit     HPI: Patient is a 84 y.o. female seen today for admission visit. She was in the hospital from 08/24/17-08/29/17 with acute on chronic hypoxic respiratory failure due to COPD exacerbation. She received steroids and antibiotic. She was discharged to SNF for rehabilitation with 2 days of antibiotic, she has completed the course. She is working with physical therapy and occupational therapy. Her breathing is improved. She complaints of dyspnea with moderate exertion. She has been on continuous oxygen at home at 3 liters/min. She has medical history of CAD, afib, chronic diastolic CHF, COPD among others.   Past Medical History:  Diagnosis Date  .  Anticoagulated    Eliquis  . Atrial flutter (HCC)    NO CARDIOLOGIST FOLLOWED BY DR Tori Milks  . Bloody stools    LAST 4 DAYS  . CHF (congestive heart failure) (HCC)   . Chronic back pain   . CKD (chronic kidney disease), stage III (HCC)    Creatinine 1.35 -1.55  . COPD (chronic obstructive pulmonary disease) (HCC)   . Diverticulosis   . Dyspnea   . Dysrhythmia   . Edema   . Gait disorder   . Glaucoma    Severe  . Glaucoma    both eyes  . High cholesterol   . Multinodular goiter   . PAOD (peripheral arterial occlusive disease) (HCC)    Stein in right groin, decrease pulses  . PMR (polymyalgia rheumatica) (HCC)   . Pneumonia, organism unspecified(486) 04/18/2016  . Protein-calorie malnutrition (HCC)   . Rash of back   . Weakness    Past Surgical History:  Procedure Laterality Date  . COLONOSCOPY N/A 07/15/2013   Procedure: COLONOSCOPY;  Surgeon: Louis Meckel, MD;  Location: [facility] ENDOSCOPY;  Service: Endoscopy;  Laterality: N/A;  . ESOPHAGOGASTRODUODENOSCOPY N/A 07/08/2013   Procedure: ESOPHAGOGASTRODUODENOSCOPY (EGD);  Surgeon: Louis Meckel, MD;  Location: [facility];  Service: Endoscopy;  Laterality: N/A;  Per Connye Burkitt, will try to have CRNA available, but will not guaranteee. Ok to schedule per Clydie Braun 07/06/13  . GALLBLADDER SURGERY    . IR GENERIC HISTORICAL  05/18/2014   IR RADIOLOGIST EVAL & MGMT 05/18/2014 Berdine Dance, MD [facility] INTERV RAD  . LAMINECTOMY    . leg stent Right   . SHOULDER SURGERY  frozen shoulder/ Bil shoulders    reports that she quit smoking about 24 years ago. Her smoking use included cigarettes. She has a 15.00 pack-year smoking history. She has never used smokeless tobacco. She reports that she does not drink alcohol or use drugs. Social History   Socioeconomic History  . Marital status: Married    Spouse name: Not on file  . Number of children: 4  . Years of education: Not on file  . Highest education level: Not on file    Occupational History  . Occupation: Retired  Engineer, production  . Financial resource strain: Not on file  . Food insecurity:    Worry: Not on file    Inability: Not on file  . Transportation needs:    Medical: Not on file    Non-medical: Not on file  Tobacco Use  . Smoking status: Former Smoker    Packs/day: 1.00    Years: 15.00    Pack years: 15.00    Types: Cigarettes    Last attempt to quit: 07/12/1993    Years since quitting: 24.1  . Smokeless tobacco: Never Used  Substance and Sexual Activity  . Alcohol use: No  . Drug use: No  . Sexual activity: Not on file  Lifestyle  . Physical activity:    Days per week: Not on file    Minutes per session: Not on file  . Stress: Not on file  Relationships  . Social connections:    Talks on phone: Not on file    Gets together: Not on file    Attends religious service: Not on file    Active member of club or organization: Not on file    Attends meetings of clubs or organizations: Not on file    Relationship status: Not on file  . Intimate partner violence:    Fear of current or ex partner: Not on file    Emotionally abused: Not on file    Physically abused: Not on file    Forced sexual activity: Not on file  Other Topics Concern  . Not on file  Social History Narrative   Married -Dr. Aura Fey   Former smoker - stopped 1995   Alcohol none   POA, Living Will    Functional Status Survey:    Family History  Problem Relation Age of Onset  . Throat cancer Mother   . Diabetes Maternal Uncle   . Throat cancer Maternal Grandfather   . Cirrhosis Maternal Aunt     Health Maintenance  Topic Date Due  . TETANUS/TDAP  05/07/1946  . PNA vac Low Risk Adult (1 of 2 - PCV13) 04/08/1995  . INFLUENZA VACCINE  11/05/2017  . DEXA SCAN  Completed    Allergies  Allergen Reactions  . Codeine Sulfate Nausea And Vomiting and Other (See Comments)    headache    Outpatient Encounter Medications as of 09/02/2017  Medication Sig   . albuterol (PROVENTIL) (2.5 MG/3ML) 0.083% nebulizer solution Take 3 mLs (2.5 mg total) by nebulization every 6 (six) hours as needed for wheezing or shortness of breath. DX: J44.9  . atorvastatin (LIPITOR) 10 MG tablet Take 5 mg by mouth daily.   . benzonatate (TESSALON) 100 MG capsule Take 1 capsule (100 mg total) by mouth 3 (three) times daily as needed for cough.  . bimatoprost (LUMIGAN) 0.01 % SOLN Place 1 drop into both eyes at bedtime.  . cholecalciferol (VITAMIN D) 1000 UNITS tablet Take 1,000 Units by mouth at  bedtime.   Marland Kitchen diltiazem (CARDIZEM CD) 360 MG 24 hr capsule Take 1 capsule (360 mg total) by mouth daily.  Marland Kitchen ELIQUIS 2.5 MG TABS tablet Take 2.5 mg by mouth 2 (two) times daily.  . feeding supplement, ENSURE ENLIVE, (ENSURE ENLIVE) LIQD Take 237 mLs by mouth 2 (two) times daily between meals.  . furosemide (LASIX) 20 MG tablet Take 1 tablet (20 mg total) by mouth daily.  . OXYGEN Inhale 2 L into the lungs daily.  . potassium chloride (K-DUR,KLOR-CON) 10 MEQ tablet Take 10 mEq by mouth 2 (two) times daily.  Marland Kitchen tetrahydrozoline 0.05 % ophthalmic solution Place 1 drop into both eyes every 4 (four) hours as needed.  . umeclidinium-vilanterol (ANORO ELLIPTA) 62.5-25 MCG/INH AEPB Inhale 1 puff into the lungs daily.  . vitamin E 1000 UNIT capsule Take 500 Units by mouth 2 (two) times daily.   . [DISCONTINUED] albuterol (PROAIR HFA) 108 (90 Base) MCG/ACT inhaler Inhale 2 puffs into the lungs every 6 (six) hours as needed for wheezing or shortness of breath.  . [DISCONTINUED] tetrahydrozoline 0.05 % ophthalmic solution Place 1 drop into both eyes 4 (four) times daily as needed (For dry eyes.).   No facility-administered encounter medications on file as of 09/02/2017.     Review of Systems  Constitutional: Positive for fatigue. Negative for appetite change, chills, diaphoresis and fever.  HENT: Negative for congestion, mouth sores, postnasal drip, rhinorrhea, sinus pressure, sore throat and  trouble swallowing.   Eyes: Positive for visual disturbance.  Respiratory: Positive for shortness of breath. Negative for cough and wheezing.        Has dyspnea with exertion  Cardiovascular: Negative for chest pain and palpitations.       Has some edema to her feet that improves with leg elevation  Gastrointestinal: Negative for abdominal pain, constipation, diarrhea, nausea and vomiting.  Genitourinary: Negative for dysuria and frequency.  Musculoskeletal: Positive for gait problem. Negative for arthralgias.  Skin: Negative for rash.  Neurological: Negative for dizziness, seizures, light-headedness and headaches.  Psychiatric/Behavioral: Positive for confusion. Negative for dysphoric mood. The patient is not nervous/anxious.     Vitals:   09/02/17 1619  BP: 124/75  Pulse: 78  Resp: 18  Temp: (!) 97 F (36.1 C)  TempSrc: Oral  SpO2: 98%  Weight: 109 lb (49.4 kg)  Height:  (1.6 m)   Body mass index is 19.31 kg/m.   Wt Readings from Last 3 Encounters:  09/02/17 109 lb (49.4 kg)  08/26/17 112 lb (50.8 kg)  05/12/17 111 lb 9.6 oz (50.6 kg)   Physical Exam  Constitutional:  Thin built, frail, elderly female in no acute distress  HENT:  Head: Normocephalic and atraumatic.  Nose: Nose normal.  Mouth/Throat: Oropharynx is clear and moist. No oropharyngeal exudate.  Eyes: Pupils are equal, round, and reactive to light. Conjunctivae and EOM are normal. Right eye exhibits no discharge. Left eye exhibits no discharge.  Neck: Normal range of motion. Neck supple. No thyromegaly present.  Cardiovascular: Normal rate, regular rhythm and intact distal pulses.  Pulmonary/Chest: Effort normal and breath sounds normal. No respiratory distress. She has no wheezes. She has no rales.  On 3 liters o2 by nasal canula  Abdominal: Soft. Bowel sounds are normal. She exhibits no mass. There is no tenderness. There is no rebound.  Musculoskeletal: Normal range of motion.  Able to move all 4  extremities, generalized weakness, good grip strength, trace feet edema  Lymphadenopathy:    She has no  cervical adenopathy.  Neurological: She is alert. She exhibits normal muscle tone.  Oriented to self and place but not to time  Skin: Skin is warm and dry. She is not diaphoretic.  Psychiatric: She has a normal mood and affect. Her behavior is normal.    Labs reviewed: Basic Metabolic Panel: Recent Labs    01/13/17 0534  08/25/17 0300 08/26/17 0319 08/27/17 0524  NA 137   < > 135 141 141  K 5.3*   < > 3.8 3.6 3.6  CL 103   < > 99* 108 106  CO2 23   < > 21* 20* 19*  GLUCOSE 152*   < > 201* 156* 166*  BUN 36*   < > 27* 40* 50*  CREATININE 1.87*   < > 1.90* 1.49* 1.60*  CALCIUM 9.0   < > 8.4* 8.7* 9.0  MG 2.3  --   --   --   --    < > = values in this interval not displayed.   Liver Function Tests: Recent Labs    08/24/17 1211  AST 25  ALT 25  ALKPHOS 80  BILITOT 0.8  PROT 8.0  ALBUMIN 3.4*   No results for input(s): LIPASE, AMYLASE in the last 8760 hours. No results for input(s): AMMONIA in the last 8760 hours. CBC: Recent Labs    01/12/17 1507 08/24/17 1211 08/25/17 0300 08/26/17 0319 08/27/17 0524  WBC 14.4* 10.7* 6.1 19.1* 20.0*  NEUTROABS 10.7* 8.6*  --   --  18.9*  HGB 15.6* 15.2* 11.8* 11.7* 11.3*  HCT 46.0 45.0 35.9* 35.5* 34.5*  MCV 90.6 91.3 91.6 90.3 90.6  PLT 353 417* 323 417* 448*   Cardiac Enzymes: Recent Labs    09/06/16 0108 09/06/16 0805 01/12/17 1507  TROPONINI <0.03 <0.03 <0.03   BNP: Invalid input(s): POCBNP No results found for: HGBA1C Lab Results  Component Value Date   TSH 3.58 05/15/2016   No results found for: VITAMINB12 No results found for: FOLATE No results found for: IRON, TIBC, FERRITIN  Imaging and Procedures obtained prior to SNF admission: Dg Chest 2 View  Result Date: 08/24/2017 CLINICAL DATA:  Dyspnea EXAM: CHEST - 2 VIEW COMPARISON:  01/12/2017 chest radiograph. FINDINGS: Stable cardiomediastinal  silhouette with normal heart size. No pneumothorax. No pleural effusion. Mildly hyperinflated lungs. Emphysema. No pulmonary edema. Mild chronic bibasilar scarring. No acute consolidative airspace disease. IMPRESSION: No acute cardiopulmonary disease. Mildly hyperinflated lungs and emphysema, suggesting COPD. Mild chronic bibasilar scarring. Electronically Signed   By: Delbert Phenix M.D.   On: 08/24/2017 13:28    Assessment/Plan  1. Chronic respiratory failure with hypoxia (HCC) Breathing currently stable, continue oxygen and bronchodilators, monitor for signs of exacerbation  2. Chronic obstructive pulmonary disease, unspecified COPD type (HCC) With recent exacerbation. Completed course of steroid and antibiotic. Continue o2 by nasal canula and anoro with prn albuterol  3. PAF (paroxysmal atrial fibrillation) (HCC) Controlled HR. Continue diltiazem 360 mg daily and eliquis 2.5 mg bid  4. Chronic diastolic congestive heart failure (HCC) euvolemic on exam. Continue furosemide 20 mg daily. Check bmp and weight. Continue kcl  5. Physical deconditioning Will have her work with physical therapy and occupational therapy team to help with gait training and muscle strengthening exercises.fall precautions. Skin care. Encourage to be out of bed.   6. Leukocytosis, unspecified type Check cbc with diff, afebrile  7. CKD (chronic kidney disease) stage 4, GFR 15-29 ml/min (HCC) Check renal function, maintain hydration  Family/ staff Communication: reviewed care plan with patient and charge nurse.    Labs/tests ordered: cbc with diff, cmp, weight 3 days a week.   Oneal Grout, MD Internal Medicine [facility] [address] [place], [place] [address] Cell Phone (Monday-Friday 8 am - 5 pm): [phone] On Call: [phone] and follow prompts after 5 pm and on weekends Office Phone: [phone] Office Fax: [phone]

## 2017-09-03 ENCOUNTER — Encounter: Payer: Self-pay | Admitting: *Deleted

## 2017-09-03 LAB — COMPLETE METABOLIC PANEL WITH GFR
ALBUMIN: 3.3
ALK PHOS: 58
ALT: 53
AST: 19
BILIRUBIN TOTAL: 0.6
BUN: 30 — AB (ref 4–21)
CALCIUM: 8.5
Creat: 1.52
EGFR (Non-African Amer.): 30
GLUCOSE: 78
POTASSIUM: 4.5
Protein: 5.3
Sodium: 142

## 2017-09-03 LAB — CBC
HCT: 40.1
HGB: 13.3
MCV: 90.1
PLATELET COUNT: 320
WBC: 12.7

## 2017-09-03 NOTE — Progress Notes (Signed)
This encounter was created in error - please disregard.

## 2017-09-08 ENCOUNTER — Ambulatory Visit: Payer: Medicare Other | Admitting: Physician Assistant

## 2017-09-09 ENCOUNTER — Ambulatory Visit: Payer: Medicare Other | Admitting: Pulmonary Disease

## 2017-09-10 LAB — CBC
HCT: 35.4
HEMOGLOBIN: 11.7
MCV: 90.5
PLATELET COUNT: 189
WBC: 9.2

## 2017-09-11 ENCOUNTER — Encounter: Payer: Self-pay | Admitting: *Deleted

## 2017-09-17 ENCOUNTER — Encounter: Payer: Self-pay | Admitting: Family

## 2017-09-17 ENCOUNTER — Non-Acute Institutional Stay (SKILLED_NURSING_FACILITY): Payer: Medicare Other | Admitting: Family

## 2017-09-17 DIAGNOSIS — I5032 Chronic diastolic (congestive) heart failure: Secondary | ICD-10-CM | POA: Diagnosis not present

## 2017-09-17 DIAGNOSIS — I480 Paroxysmal atrial fibrillation: Secondary | ICD-10-CM

## 2017-09-17 DIAGNOSIS — R2681 Unsteadiness on feet: Secondary | ICD-10-CM | POA: Diagnosis not present

## 2017-09-17 DIAGNOSIS — E782 Mixed hyperlipidemia: Secondary | ICD-10-CM | POA: Diagnosis not present

## 2017-09-17 DIAGNOSIS — N184 Chronic kidney disease, stage 4 (severe): Secondary | ICD-10-CM | POA: Diagnosis not present

## 2017-09-17 DIAGNOSIS — J449 Chronic obstructive pulmonary disease, unspecified: Secondary | ICD-10-CM | POA: Diagnosis not present

## 2017-09-17 DIAGNOSIS — J9611 Chronic respiratory failure with hypoxia: Secondary | ICD-10-CM | POA: Diagnosis not present

## 2017-09-17 NOTE — Progress Notes (Signed)
Location:  [facility] Room Number: 25 Place of Service:  SNF (31)  Provider: Linde Wilensky FNP-C   PCP: Coralyn Helling, MD Patient Care Team: Coralyn Helling, MD as PCP - General (Pulmonary Disease) Kimber Relic, MD as Consulting Physician (Internal Medicine)  Extended Emergency Contact Information Primary Emergency Contact: Norm Parcel [place] Phone: [phone] Relation: Daughter Secondary Emergency Contact: Bernette Redbird [place] of [place] Mobile Phone: [phone] Relation: Daughter  Code Status: DNR Goals of care:  Advanced Directive information Advanced Directives 09/17/2017  Does Patient Have a Medical Advance Directive? Yes  Type of Advance Directive Living will;Out of facility DNR (pink MOST or yellow form);Healthcare Power of Attorney  Does patient want to make changes to medical advance directive? -  Copy of Healthcare Power of Attorney in Chart? Yes  Would patient like information on creating a medical advance directive? -  Pre-existing out of facility DNR order (yellow form or pink MOST form) Yellow form placed in chart (order not valid for inpatient use)     Allergies  Allergen Reactions  . Codeine Sulfate Nausea And Vomiting and Other (See Comments)    headache    Chief Complaint  Patient presents with  . Discharge Note    HPI:  84 y.o. female seen today at [facility] for discharge back to independent Living.She was here for short term rehabilitation for post hospital admission from 08/24/2017-08/29/2017 with acute on chronic respiratory failure with hypoxia secondary to COPD exacerbation.she was treated with a course of Prednisone and antibiotics then discharged to Skilled Nursing facility for Rehabilitation.she has a medical history of HTN,CHF,PAF,hyperlipidemia,CKd stage 4,COPD among other conditions.she is seen in her room today resting on recliner.she denies any acute issues during visit.she  has had unremarkable stay here in rehab.  She has worked well with PT/OT now stable for discharge back to independent Living.She will be discharged with health service PT/OT to continue with ROM, Exercise, Gait stability and muscle strengthening. She will does not require any   DME has own Rolator.Health services will be arranged by facility social worker prior to discharge.she will be discharged with her Prescription medication from the facility then patient to follow up with PCP.she has an appointment with PCP Dr. Glade Lloyd 10/12/2017 at 1:45 Pm.she also has an appointment with Pulmonary Dr.sood on 10/21/2017 at 11: 45 Am and Cardiologist Dr. Swaziland 10/22/2017 at 10:30 Am.Patient's daughter will be available to assist with ADL's per facility social worker.Facility staff report no new concerns.  Past Medical History:  Diagnosis Date  . Anticoagulated    Eliquis  . Atrial flutter (HCC)    NO CARDIOLOGIST FOLLOWED BY DR Tori Milks  . Bloody stools    LAST 4 DAYS  . CHF (congestive heart failure) (HCC)   . Chronic back pain   . CKD (chronic kidney disease), stage III (HCC)    Creatinine 1.35 -1.55  . COPD (chronic obstructive pulmonary disease) (HCC)   . Diverticulosis   . Dyspnea   . Dysrhythmia   . Edema   . Gait disorder   . Glaucoma    Severe  . Glaucoma    both eyes  . High cholesterol   . Multinodular goiter   . PAOD (peripheral arterial occlusive disease) (HCC)    Stein in right groin, decrease pulses  . PMR (polymyalgia rheumatica) (HCC)   . Pneumonia, organism unspecified(486) 04/18/2016  . Protein-calorie malnutrition (HCC)   . Rash of back   . Weakness  Past Surgical History:  Procedure Laterality Date  . COLONOSCOPY N/A 07/15/2013   Procedure: COLONOSCOPY;  Surgeon: Louis Meckel, MD;  Location: [facility] ENDOSCOPY;  Service: Endoscopy;  Laterality: N/A;  . ESOPHAGOGASTRODUODENOSCOPY N/A 07/08/2013   Procedure: ESOPHAGOGASTRODUODENOSCOPY (EGD);  Surgeon: Louis Meckel, MD;   Location: [facility];  Service: Endoscopy;  Laterality: N/A;  Per Connye Burkitt, will try to have CRNA available, but will not guaranteee. Ok to schedule per Clydie Braun 07/06/13  . GALLBLADDER SURGERY    . IR GENERIC HISTORICAL  05/18/2014   IR RADIOLOGIST EVAL & MGMT 05/18/2014 Berdine Dance, MD [facility] INTERV RAD  . LAMINECTOMY    . leg stent Right   . SHOULDER SURGERY     frozen shoulder/ Bil shoulders      reports that she quit smoking about 24 years ago. Her smoking use included cigarettes. She has a 15.00 pack-year smoking history. She has never used smokeless tobacco. She reports that she does not drink alcohol or use drugs. Social History   Socioeconomic History  . Marital status: Married    Spouse name: Not on file  . Number of children: 4  . Years of education: Not on file  . Highest education level: Not on file  Occupational History  . Occupation: Retired  Engineer, production  . Financial resource strain: Not on file  . Food insecurity:    Worry: Not on file    Inability: Not on file  . Transportation needs:    Medical: Not on file    Non-medical: Not on file  Tobacco Use  . Smoking status: Former Smoker    Packs/day: 1.00    Years: 15.00    Pack years: 15.00    Types: Cigarettes    Last attempt to quit: 07/12/1993    Years since quitting: 24.2  . Smokeless tobacco: Never Used  Substance and Sexual Activity  . Alcohol use: No  . Drug use: No  . Sexual activity: Not on file  Lifestyle  . Physical activity:    Days per week: Not on file    Minutes per session: Not on file  . Stress: Not on file  Relationships  . Social connections:    Talks on phone: Not on file    Gets together: Not on file    Attends religious service: Not on file    Active member of club or organization: Not on file    Attends meetings of clubs or organizations: Not on file    Relationship status: Not on file  . Intimate partner violence:    Fear of current or ex partner: Not on file    Emotionally  abused: Not on file    Physically abused: Not on file    Forced sexual activity: Not on file  Other Topics Concern  . Not on file  Social History Narrative   Married -Dr. Aura Fey   Former smoker - stopped 1995   Alcohol none   POA, Living Will    Allergies  Allergen Reactions  . Codeine Sulfate Nausea And Vomiting and Other (See Comments)    headache    Pertinent  Health Maintenance Due  Topic Date Due  . PNA vac Low Risk Adult (1 of 2 - PCV13) 04/08/1995  . INFLUENZA VACCINE  11/05/2017  . DEXA SCAN  Completed    Medications: Outpatient Encounter Medications as of 09/17/2017  Medication Sig  . albuterol (PROVENTIL HFA;VENTOLIN HFA) 108 (90 Base) MCG/ACT inhaler Inhale 1 puff into  the lungs every 6 (six) hours as needed for wheezing or shortness of breath.  Marland Kitchen albuterol (PROVENTIL) (2.5 MG/3ML) 0.083% nebulizer solution Take 3 mLs (2.5 mg total) by nebulization every 6 (six) hours as needed for wheezing or shortness of breath. DX: J44.9  . atorvastatin (LIPITOR) 10 MG tablet Take 5 mg by mouth daily.   . benzonatate (TESSALON) 100 MG capsule Take 1 capsule (100 mg total) by mouth 3 (three) times daily as needed for cough.  . bimatoprost (LUMIGAN) 0.01 % SOLN Place 1 drop into both eyes at bedtime.  . cholecalciferol (VITAMIN D) 1000 UNITS tablet Take 1,000 Units by mouth at bedtime.   Marland Kitchen diltiazem (CARDIZEM CD) 360 MG 24 hr capsule Take 1 capsule (360 mg total) by mouth daily.  Marland Kitchen ELIQUIS 2.5 MG TABS tablet Take 2.5 mg by mouth 2 (two) times daily.  . furosemide (LASIX) 20 MG tablet Take 1 tablet (20 mg total) by mouth daily.  . Nutritional Supplements (BOOST PLUS PO) Take 1 Can by mouth 2 (two) times daily between meals.  . OXYGEN Inhale 2 L into the lungs daily.  . potassium chloride (K-DUR,KLOR-CON) 10 MEQ tablet Take 10 mEq by mouth 2 (two) times daily.  Marland Kitchen tetrahydrozoline 0.05 % ophthalmic solution Place 1 drop into both eyes every 4 (four) hours as needed.  .  umeclidinium-vilanterol (ANORO ELLIPTA) 62.5-25 MCG/INH AEPB Inhale 1 puff into the lungs daily.  . vitamin E 1000 UNIT capsule Take 500 Units by mouth 2 (two) times daily.   . [DISCONTINUED] feeding supplement, ENSURE ENLIVE, (ENSURE ENLIVE) LIQD Take 237 mLs by mouth 2 (two) times daily between meals.   No facility-administered encounter medications on file as of 09/17/2017.      Review of Systems  Constitutional: Negative for appetite change, chills, fatigue and fever.  HENT: Negative for congestion, rhinorrhea, sinus pressure, sinus pain, sneezing and sore throat.   Eyes: Negative for discharge, redness, itching and visual disturbance.  Respiratory: Negative for cough, chest tightness, shortness of breath and wheezing.        Oxygen via nasal cannula  Cardiovascular: Negative for chest pain, palpitations and leg swelling.  Gastrointestinal: Negative for abdominal distention, abdominal pain, constipation, diarrhea, nausea and vomiting.  Endocrine: Negative for cold intolerance, heat intolerance, polydipsia, polyphagia and polyuria.  Genitourinary: Negative for dysuria, flank pain, frequency and urgency.  Musculoskeletal: Positive for gait problem.  Skin: Negative for color change, pallor, rash and wound.  Neurological: Negative for dizziness, light-headedness and headaches.  Hematological: Does not bruise/bleed easily.  Psychiatric/Behavioral: Negative for agitation, confusion and sleep disturbance. The patient is not nervous/anxious.     Vitals:   09/17/17 1011  BP: 129/77  Pulse: 83  Resp: (!) 22  Temp: (!) 96.8 F (36 C)  SpO2: 96%  Weight: 106 lb (48.1 kg)  Height: 5\' 3"  (1.6 m)   Body mass index is 18.78 kg/m. Physical Exam  Nursing note and vitals reviewed. Constitutional:  Thin built elderly in no acute distress   HENT:  Head: Normocephalic.  Mouth/Throat: Oropharynx is clear and moist. No oropharyngeal exudate.  Eyes: Pupils are equal, round, and reactive to  light. Conjunctivae and EOM are normal. Right eye exhibits no discharge. Left eye exhibits no discharge. No scleral icterus.  Neck: Normal range of motion. No JVD present. No thyromegaly present.  Cardiovascular: Normal rate, regular rhythm, normal heart sounds and intact distal pulses. Exam reveals no gallop and no friction rub.  No murmur heard. Respiratory: Effort normal and  breath sounds normal. No respiratory distress. She has no wheezes. She has no rales.  Oxygen via nasal cannula in place  GI: Soft. Bowel sounds are normal. She exhibits no distension and no mass. There is no tenderness. There is no rebound and no guarding.  Musculoskeletal: She exhibits no edema or tenderness.  Unsteady gait ambulates with Rolator.  Lymphadenopathy:    She has no cervical adenopathy.  Neurological: Gait normal.  Alert and oriented to person and place but not time  Skin: Skin is warm and dry. No rash noted. No erythema. No pallor.  Psychiatric: She has a normal mood and affect. Her speech is normal and behavior is normal. Judgment and thought content normal.   Labs reviewed: Basic Metabolic Panel: Recent Labs    01/13/17 0534  08/25/17 0300 08/26/17 0319 08/27/17 0524 09/03/17  NA 137   < > 135 141 141 142  K 5.3*   < > 3.8 3.6 3.6 4.5  CL 103   < > 99* 108 106  --   CO2 23   < > 21* 20* 19*  --   GLUCOSE 152*   < > 201* 156* 166*  --   BUN 36*   < > 27* 40* 50* 30*  CREATININE 1.87*   < > 1.90* 1.49* 1.60* 1.52  CALCIUM 9.0   < > 8.4* 8.7* 9.0 8.5  MG 2.3  --   --   --   --   --    < > = values in this interval not displayed.   Liver Function Tests: Recent Labs    08/24/17 1211 09/03/17  AST 25 19  ALT 25 53  ALKPHOS 80 58  BILITOT 0.8 0.6  PROT 8.0  --   ALBUMIN 3.4* 3.3   CBC: Recent Labs    01/12/17 1507 08/24/17 1211 08/25/17 0300 08/26/17 0319 08/27/17 0524 09/03/17 09/10/17  WBC 14.4* 10.7* 6.1 19.1* 20.0* 12.7 9.2  NEUTROABS 10.7* 8.6*  --   --  18.9*  --   --     HGB 15.6* 15.2* 11.8* 11.7* 11.3* 13.3 11.7  HCT 46.0 45.0 35.9* 35.5* 34.5* 40.1 35.4  MCV 90.6 91.3 91.6 90.3 90.6 90.1 90.5  PLT 353 417* 323 417* 448*  --   --    Cardiac Enzymes: Recent Labs    01/12/17 1507  TROPONINI <0.03    Procedures and Imaging Studies During Stay: Dg Chest 2 View Result Date: 08/24/2017 CLINICAL DATA:  Dyspnea EXAM: CHEST - 2 VIEW COMPARISON:  01/12/2017 chest radiograph. FINDINGS: Stable cardiomediastinal silhouette with normal heart size. No pneumothorax. No pleural effusion. Mildly hyperinflated lungs. Emphysema. No pulmonary edema. Mild chronic bibasilar scarring. No acute consolidative airspace disease. IMPRESSION: No acute cardiopulmonary disease. Mildly hyperinflated lungs and emphysema, suggesting COPD. Mild chronic bibasilar scarring. Electronically Signed   By: Delbert Phenix M.D.   On: 08/24/2017 13:28   Assessment/Plan:     1. Unsteady gait Has worked well with PT/ OT. Will discharge with Home PT/OT to continue with ROM, Exercise, Gait stability and muscle strengthening.No DME required has own Rolator.continue with fall and safety precautions.   2. Chronic respiratory failure with hypoxia  Status post hospital admission as above.Has improved post treatment with prednisone and antibiotics.she continues to require continuous oxygen via nasal cannula. Continue on Anoro Ellipta 62.5-25 mcg/inh one puff daily,Albuterol 2.5 mg every 6 hours as needed.Continue to monitor.   3. Chronic obstructive pulmonary disease Breathing stable.continue with breathing treatment and oxygen as  above.check CBC in 1-2 weeks with PCP.    4. Chronic diastolic congestive heart failure Euvolemic.Negative exam findings.continue on furosemide 20 mg tablet daily and  Potassium supplement.continue to monitor weight and fluid intake.follow up appointment with cardiologist as directed.BMP in 1-2 weeks with PCP.   5. PAF (paroxysmal atrial fibrillation) Continue on EliQuis 2.5 mg  tablet twice daily and Cardizem 360 mg tablet daily.  6. CKD (chronic kidney disease) stage 4, GFR 15-29 ml/min CR 1.52( 09/03/2017) at baseline.continue to monitor BMP.   7. Mixed hyperlipidemia Continue on atorvastatin 5 mg tablet daily.consider fasting lipid panel with next lab work.   Patient is being discharged with the following home health services:   -PT/OT for ROM, exercise, gait stability and muscle strengthening  Patient is being discharged with the following durable medical equipment:   -None required.Has own Rolator.  Patient has been advised to f/u with their PCP in 1-2 weeks to for a transitions of care visit.Social worker services at their facility was responsible for discharge process.Pt  discharged with her medications from the facility prescriptions of noncontrolled medications to reach the scheduled appointment.Patient not on  controlled substances.    Future labs/tests needed:  CBC, BMP in 1-2 weeks PCP

## 2017-10-12 ENCOUNTER — Encounter: Payer: Self-pay | Admitting: Internal Medicine

## 2017-10-13 ENCOUNTER — Encounter: Payer: Self-pay | Admitting: Family

## 2017-10-13 ENCOUNTER — Telehealth: Payer: Self-pay

## 2017-10-13 ENCOUNTER — Non-Acute Institutional Stay: Payer: Medicare Other | Admitting: Family

## 2017-10-13 VITALS — BP 118/68 | HR 66 | Temp 97.5°F | Resp 28 | Ht 63.0 in | Wt 111.0 lb

## 2017-10-13 DIAGNOSIS — J449 Chronic obstructive pulmonary disease, unspecified: Secondary | ICD-10-CM | POA: Diagnosis not present

## 2017-10-13 DIAGNOSIS — J9611 Chronic respiratory failure with hypoxia: Secondary | ICD-10-CM | POA: Diagnosis not present

## 2017-10-13 MED ORDER — IPRATROPIUM-ALBUTEROL 0.5-2.5 (3) MG/3ML IN SOLN: 3.0000 mL | Freq: Four times a day (QID) | RESPIRATORY_TRACT | 0 refills | Status: DC

## 2017-10-13 MED ORDER — FUROSEMIDE 20 MG PO TABS: 20.0000 mg | ORAL_TABLET | Freq: Two times a day (BID) | ORAL | 0 refills | Status: DC

## 2017-10-13 NOTE — Telephone Encounter (Signed)
Natalia LeatherwoodKatherine with [facility] called to inform Dr. Glade LloydPandey that patient has had increased shortness of breath over the last 6 days that seems to be getting worse. Patient's O2 stats are 84% while up on her feet and 95% while resting. Patient is currently on 4 L of oxygen.   Patient is scheduled to see Dr Glade LloydPandey on 10/14/17 as a new patient but Natalia LeatherwoodKatherine wanted to sure provider knew patient's status.

## 2017-10-13 NOTE — Telephone Encounter (Signed)
Per Dr. Glade LloydPandey patient needs to be evaluated in the clinic. Patient has been scheduled to see Dinah in clinic 10/13/17 at 2 pm. Patient is aware of the appointment. I will send a e-mail to [employer] Foust [place] coordinator to make her aware.

## 2017-10-13 NOTE — Patient Instructions (Addendum)
1. Change Furosemide 20 mg tablet to one by mouth twice daily  2. Duonebs inhale 3 ml every 6 hours x 5 days   3. Follow up appointment in the clinic for shortness of breath on 10/15/2017  4. [facility]. 5. Blood work to be draw on 10/15/2017 at the Assisted Living  6.Please go to Emergency room if symptoms not resolved or worsen.  Shortness of Breath, Adult Shortness of breath means you have trouble breathing. Your lungs are organs for breathing. Follow these instructions at home: Pay attention to any changes in your symptoms. Take these actions to help with your condition:  Do not smoke. Smoking can cause shortness of breath. If you need help to quit smoking, ask your doctor.  Avoid things that can make it harder to breathe, such as: ? Mold. ? Dust. ? Air pollution. ? Chemical smells. ? Things that can cause allergy symptoms (allergens), if you have allergies.  Keep your living space clean and free of mold and dust.  Rest as needed. Slowly return to your usual activities.  Take over-the-counter and prescription medicines, including oxygen and inhaled medicines, only as told by your doctor.  Keep all follow-up visits as told by your doctor. This is important.  Contact a doctor if:  Your condition does not get better as soon as expected.  You have a hard time doing your normal activities, even after you rest.  You have new symptoms. Get help right away if:  You have trouble breathing when you are resting.  You feel light-headed or you faint.  You have a cough that is not helped by medicines.  You cough up blood.  You have pain with breathing.  You have pain in your chest, arms, shoulders, or belly (abdomen).  You have a fever.  You cannot walk up stairs.  You cannot exercise the way you normally do. This information is not intended to replace advice given to you by your health care provider. Make sure you discuss  any questions you have with your health care provider. Document Released: 09/10/2007 Document Revised: 04/10/2016 Document Reviewed: 04/10/2016 Elsevier Interactive Patient Education  2017 [employer]

## 2017-10-13 NOTE — Progress Notes (Signed)
Location:  [place] of Service:  Clinic (12) Provider: Infant Zink FNP-C  Sharline Lehane, Nelda Bucks, NP  Patient Care Team: Aleksis Jiggetts, Nelda Bucks, NP as PCP - General (Family Medicine)  Extended Emergency Contact Information Primary Emergency Contact: [place] Mobile Phone: [phone] Relation: Daughter Secondary Emergency Contact: [place] Mobile Phone: [phone] Relation: Daughter  Code Status:  DNR Goals of care: Advanced Directive information Advanced Directives 10/13/2017  Does Patient Have a Medical Advance Directive? Yes  Type of Paramedic of [place];Out of facility DNR (pink MOST or yellow form);Living will  Does patient want to make changes to medical advance directive? -  Copy of [place] in Chart? Yes  Would patient like information on creating a medical advance directive? -  Pre-existing out of facility DNR order (yellow form or pink MOST form) Yellow form placed in chart (order not valid for inpatient use)     Chief Complaint  Patient presents with  . Establish Care    more shortness of breath x3 days    HPI:  Pt is a 84 y.o. female seen today at [facility] for an acute visit for evaluation of shortness of breath.she states shortness of breath has worsen for the past two days.she states taking her lasix and breathing treatment as prescribed.she denies any cough,fever or chills.she has increased her oxygen to 4 Liters via nasal cannula with some improvement.of note she has a significant medical history of COPD,CHF,Afib among other conditions.Her respiration during visit 28 breaths per minute.she does not want to be send to the Emergency room for evaluation but has agreed to go if symptoms worsen.    Past Medical History:  Diagnosis Date  . Anticoagulated    Eliquis  . Atrial flutter ([place])    NO CARDIOLOGIST FOLLOWED BY DR Conni Slipper   . Bloody stools    LAST 4 DAYS  . CHF (congestive heart failure) ([place])   . Chronic back pain   . CKD (chronic kidney disease), stage III (HCC)    Creatinine 1.35 -1.55  . COPD (chronic obstructive pulmonary disease) ([place])   . Diverticulosis   . Dyspnea   . Dysrhythmia   . Edema   . Gait disorder   . Glaucoma    Severe  . Glaucoma    both eyes  . High cholesterol   . Multinodular goiter   . PAOD (peripheral arterial occlusive disease) (HCC)    Stein in right groin, decrease pulses  . PMR (polymyalgia rheumatica) (HCC)   . Pneumonia, organism unspecified(486) 04/18/2016  . Protein-calorie malnutrition ([place])   . Rash of back   . Weakness    Past Surgical History:  Procedure Laterality Date  . COLONOSCOPY N/A 07/15/2013   Procedure: COLONOSCOPY;  Surgeon: Inda Castle, MD;  Location: [facility] ENDOSCOPY;  Service: Endoscopy;  Laterality: N/A;  . ESOPHAGOGASTRODUODENOSCOPY N/A 07/08/2013   Procedure: ESOPHAGOGASTRODUODENOSCOPY (EGD);  Surgeon: Inda Castle, MD;  Location: [facility];  Service: Endoscopy;  Laterality: N/A;  Per Gonzella Lex, will try to have CRNA available, but will not guaranteee. Ok to schedule per Santiago Glad 07/06/13  . GALLBLADDER SURGERY    . IR GENERIC HISTORICAL  05/18/2014   IR RADIOLOGIST EVAL & MGMT 05/18/2014 Greggory Keen, MD [facility] INTERV RAD  . LAMINECTOMY    . leg stent Right   . SHOULDER SURGERY     frozen shoulder/ Bil shoulders  Allergies  Allergen Reactions  . Codeine Sulfate Nausea And Vomiting and Other (See Comments)    headache    Outpatient Encounter Medications as of 10/13/2017  Medication Sig  . albuterol (PROVENTIL HFA;VENTOLIN HFA) 108 (90 Base) MCG/ACT inhaler Inhale 1 puff into the lungs every 6 (six) hours as needed for wheezing or shortness of breath.  . albuterol (PROVENTIL) (2.5 MG/3ML) 0.083% nebulizer solution Take 3 mLs (2.5 mg total) by nebulization every 6 (six) hours as needed for wheezing or shortness of breath. DX: J44.9    . atorvastatin (LIPITOR) 10 MG tablet Take 5 mg by mouth daily.   . bimatoprost (LUMIGAN) 0.01 % SOLN Place 1 drop into both eyes at bedtime.  . cholecalciferol (VITAMIN D) 1000 UNITS tablet Take 1,000 Units by mouth at bedtime.   . diltiazem (CARDIZEM CD) 360 MG 24 hr capsule Take 1 capsule (360 mg total) by mouth daily.  . ELIQUIS 2.5 MG TABS tablet Take 2.5 mg by mouth 2 (two) times daily.  . furosemide (LASIX) 20 MG tablet Take 1 tablet (20 mg total) by mouth 2 (two) times daily.  . Nutritional Supplements (BOOST PLUS PO) Take 1 Can by mouth 2 (two) times daily between meals.  . OXYGEN Inhale 2 L into the lungs daily.  . potassium chloride (K-DUR,KLOR-CON) 10 MEQ tablet Take 10 mEq by mouth 2 (two) times daily.  . tetrahydrozoline 0.05 % ophthalmic solution Place 1 drop into both eyes every 4 (four) hours as needed.  . umeclidinium-vilanterol (ANORO ELLIPTA) 62.5-25 MCG/INH AEPB Inhale 1 puff into the lungs daily.  . vitamin E 1000 UNIT capsule Take 500 Units by mouth 2 (two) times daily.   . [DISCONTINUED] furosemide (LASIX) 20 MG tablet Take 1 tablet (20 mg total) by mouth daily.  . benzonatate (TESSALON) 100 MG capsule Take 1 capsule (100 mg total) by mouth 3 (three) times daily as needed for cough. (Patient not taking: Reported on 10/13/2017)  . ipratropium-albuterol (DUONEB) 0.5-2.5 (3) MG/3ML SOLN Take 3 mLs by nebulization every 6 (six) hours for 5 days.   No facility-administered encounter medications on file as of 10/13/2017.     Review of Systems  Constitutional: Negative for appetite change, chills, fatigue and fever.  HENT: Negative for congestion, sinus pressure, sneezing and sore throat.   Eyes: Positive for visual disturbance. Negative for discharge and redness.       Wears eye glasses   Respiratory: Positive for shortness of breath. Negative for cough, chest tightness and wheezing.   Cardiovascular: Negative for chest pain, palpitations and leg swelling.  Gastrointestinal:  Negative for abdominal distention, abdominal pain, constipation, diarrhea, nausea and vomiting.  Musculoskeletal: Positive for gait problem.  Skin: Negative for color change, pallor and rash.  Neurological: Negative for dizziness, light-headedness and headaches.  Psychiatric/Behavioral: Negative for agitation and sleep disturbance. The patient is not nervous/anxious.     Immunization History  Administered Date(s) Administered  . Influenza Split 12/30/2016  . Influenza, High Dose Seasonal PF 01/05/2016  . Influenza-Unspecified 12/06/2013  . Pneumococcal Polysaccharide-23 04/07/1994  . Zoster 05/07/1989   Pertinent  Health Maintenance Due  Topic Date Due  . PNA vac Low Risk Adult (1 of 2 - PCV13) 04/08/1995  . INFLUENZA VACCINE  11/05/2017  . DEXA SCAN  Completed   No flowsheet data found. Functional Status Survey:    Vitals:   10/13/17 1354  BP: 118/68  Pulse: 66  Resp: (!) 28  Temp: (!) 97.5 F (36.4 C)  SpO2: 90%    Weight: 111 lb (50.3 kg)  Height: 5' 3" (1.6 m)   Body mass index is 19.66 kg/m. Physical Exam  Constitutional: She is oriented to person, place, and time.  Thin built elderly in no acute distress   HENT:  Head: Normocephalic.  Right Ear: External ear normal.  Left Ear: External ear normal.  Mouth/Throat: Oropharynx is clear and moist. No oropharyngeal exudate.  Eyes: Pupils are equal, round, and reactive to light. Conjunctivae and EOM are normal. Right eye exhibits no discharge. Left eye exhibits no discharge. No scleral icterus.  Eye glasses in place   Neck: Normal range of motion. No JVD present. No thyromegaly present.  Cardiovascular: Normal rate, regular rhythm, normal heart sounds and intact distal pulses. Exam reveals no gallop and no friction rub.  No murmur heard. Pulmonary/Chest: No accessory muscle usage. She has no wheezes. She has no rales.  Tachypnea 28 b/min bilateral lung diminished breath sounds.Portable oxygen 4 liters via nasal  cannula in place   Abdominal: Soft. Bowel sounds are normal. She exhibits no distension and no mass. There is no tenderness. There is no rebound and no guarding.  Musculoskeletal: She exhibits no edema or tenderness.  Unsteady gait uses a Teacher, adult education for long distance and Rolator for short distance.   Lymphadenopathy:    She has no cervical adenopathy.  Neurological: She is oriented to person, place, and time. Gait abnormal.  Skin: Skin is warm and dry. No rash noted. No erythema. No pallor.  Psychiatric: She has a normal mood and affect. Her behavior is normal. Judgment and thought content normal.  Vitals reviewed.   Labs reviewed: Recent Labs    01/13/17 0534  08/25/17 0300 08/26/17 0319 08/27/17 0524 09/03/17  NA 137   < > 135 141 141 142  K 5.3*   < > 3.8 3.6 3.6 4.5  CL 103   < > 99* 108 106  --   CO2 23   < > 21* 20* 19*  --   GLUCOSE 152*   < > 201* 156* 166*  --   BUN 36*   < > 27* 40* 50* 30*  CREATININE 1.87*   < > 1.90* 1.49* 1.60* 1.52  CALCIUM 9.0   < > 8.4* 8.7* 9.0 8.5  MG 2.3  --   --   --   --   --    < > = values in this interval not displayed.   Recent Labs    08/24/17 1211 09/03/17  AST 25 19  ALT 25 53  ALKPHOS 80 58  BILITOT 0.8 0.6  PROT 8.0  --   ALBUMIN 3.4* 3.3   Recent Labs    01/12/17 1507 08/24/17 1211 08/25/17 0300 08/26/17 0319 08/27/17 0524 09/03/17 09/10/17  WBC 14.4* 10.7* 6.1 19.1* 20.0* 12.7 9.2  NEUTROABS 10.7* 8.6*  --   --  18.9*  --   --   HGB 15.6* 15.2* 11.8* 11.7* 11.3* 13.3 11.7  HCT 46.0 45.0 35.9* 35.5* 34.5* 40.1 35.4  MCV 90.6 91.3 91.6 90.3 90.6 90.1 90.5  PLT 353 417* 323 417* 448*  --   --    Lab Results  Component Value Date   TSH 3.58 05/15/2016   No results found for: HGBA1C No results found for: CHOL, HDL, LDLCALC, LDLDIRECT, TRIG, CHOLHDL  Significant Diagnostic Results in last 30 days:  No results found.  Assessment/Plan 1. Chronic obstructive pulmonary disease, unspecified COPD type  ([place]) Afebrile.No worsening of cough or sputum production.Bilateral lung  sounds diminished throughout.Tachypnea 28 breaths/minutes.Recomended to be evaluated in the emergency room but patient states does not want to go to the ER.Start on Duoneb inhale 3 ml via nebulizer every 6 hours x 5 days.Portable chest X-ray 2 views to rule out pneumonia.Increase Furosemide 20 mg tablet from once daily to twice daily first dose today.Following lab work to rule out infectious or metabolic etiologies. - CBC with Differential/Platelets; Future - CMP with eGFR([facility]); Future I've strongly encouraged patient to go to emergency room if symptoms not resolved or worsen.Also notify provide for any fever or chills. Patient verbalized understanding.Follow up with Provider on 10/15/2017.   2. Chronic Respiratory failure with hypoxia  Afebrile.bilateral diminished breath sounds.continue on oxygen via nasal cannula.duoneb every 6 hours.Get evaluated in ER if symptoms worsen as discussed above.   Family/ staff Communication: Reviewed plan of care with patient recommended to be evaluated today in the ER due to Tachypnea,shortness of breath and diminished breath sounds but patient declined.However,she will go to ER if symptoms worsen. Labs/tests ordered:- CBC with Differential/Platelets and  - CMP with eGFR([facility]) 10/15/2017 -Portable Chest X-ray 2 views today rule out pneumonia.   Dinah C Ngetich, NP 

## 2017-10-14 ENCOUNTER — Telehealth: Payer: Self-pay | Admitting: *Deleted

## 2017-10-14 ENCOUNTER — Encounter: Payer: Medicare Other | Admitting: Internal Medicine

## 2017-10-14 ENCOUNTER — Encounter: Payer: Self-pay | Admitting: Internal Medicine

## 2017-10-14 ENCOUNTER — Encounter: Payer: Self-pay | Admitting: Family

## 2017-10-14 DIAGNOSIS — I739 Peripheral vascular disease, unspecified: Secondary | ICD-10-CM | POA: Insufficient documentation

## 2017-10-14 DIAGNOSIS — E041 Nontoxic single thyroid nodule: Secondary | ICD-10-CM | POA: Insufficient documentation

## 2017-10-14 DIAGNOSIS — M810 Age-related osteoporosis without current pathological fracture: Secondary | ICD-10-CM | POA: Insufficient documentation

## 2017-10-14 DIAGNOSIS — M6530 Trigger finger, unspecified finger: Secondary | ICD-10-CM | POA: Insufficient documentation

## 2017-10-14 NOTE — Telephone Encounter (Signed)
Per Dinah-CXR shows no PNA or fluid build up. Advises to continue with Duoneb and monitor for worsening or no better. Patient notified. She said she feels some better today. She took the extra lasix last PM. Follow up and labs scheduled for tomorrow.

## 2017-10-15 ENCOUNTER — Non-Acute Institutional Stay: Payer: Medicare Other | Admitting: Family

## 2017-10-15 ENCOUNTER — Encounter: Payer: Self-pay | Admitting: Family

## 2017-10-15 VITALS — BP 114/60 | HR 76 | Temp 97.3°F | Resp 24 | Ht 63.0 in | Wt 112.2 lb

## 2017-10-15 DIAGNOSIS — J9611 Chronic respiratory failure with hypoxia: Secondary | ICD-10-CM | POA: Diagnosis not present

## 2017-10-15 DIAGNOSIS — J449 Chronic obstructive pulmonary disease, unspecified: Secondary | ICD-10-CM

## 2017-10-15 LAB — CBC WITH DIFFERENTIAL/PLATELET
Basophils Absolute: 96 cells/uL (ref 0–200)
Basophils Relative: 1 %
EOS ABS: 470 {cells}/uL (ref 15–500)
Eosinophils Relative: 4.9 %
HCT: 41.6 % (ref 35.0–45.0)
Hemoglobin: 14.1 g/dL (ref 11.7–15.5)
Lymphs Abs: 1238 cells/uL (ref 850–3900)
MCH: 30.6 pg (ref 27.0–33.0)
MCHC: 33.9 g/dL (ref 32.0–36.0)
MCV: 90.2 fL (ref 80.0–100.0)
MPV: 10.4 fL (ref 7.5–12.5)
Monocytes Relative: 12.3 %
NEUTROS ABS: 6614 {cells}/uL (ref 1500–7800)
Neutrophils Relative %: 68.9 %
PLATELETS: 323 10*3/uL (ref 140–400)
RBC: 4.61 10*6/uL (ref 3.80–5.10)
RDW: 15 % (ref 11.0–15.0)
Total Lymphocyte: 12.9 %
WBC: 9.6 10*3/uL (ref 3.8–10.8)
WBCMIX: 1181 {cells}/uL — AB (ref 200–950)

## 2017-10-15 LAB — COMPLETE METABOLIC PANEL WITH GFR
AG Ratio: 1.8 (calc) (ref 1.0–2.5)
ALT: 13 U/L (ref 6–29)
AST: 19 U/L (ref 10–35)
Albumin: 4.1 g/dL (ref 3.6–5.1)
Alkaline phosphatase (APISO): 72 U/L (ref 33–130)
BUN/Creatinine Ratio: 16 (calc) (ref 6–22)
BUN: 32 mg/dL — AB (ref 7–25)
CALCIUM: 9.4 mg/dL (ref 8.6–10.4)
CO2: 24 mmol/L (ref 20–32)
CREATININE: 2.05 mg/dL — AB (ref 0.60–0.88)
Chloride: 106 mmol/L (ref 98–110)
GFR, EST NON AFRICAN AMERICAN: 21 mL/min/{1.73_m2} — AB (ref >=60)
GFR, Est African American: 24 mL/min/{1.73_m2} — ABNORMAL LOW (ref >=60)
GLOBULIN: 2.3 g/dL (ref 1.9–3.7)
Glucose, Bld: 88 mg/dL (ref 65–99)
Potassium: 4 mmol/L (ref 3.5–5.3)
SODIUM: 141 mmol/L (ref 135–146)
Total Bilirubin: 0.9 mg/dL (ref 0.2–1.2)
Total Protein: 6.4 g/dL (ref 6.1–8.1)

## 2017-10-15 MED ORDER — IPRATROPIUM-ALBUTEROL 0.5-2.5 (3) MG/3ML IN SOLN: 3.0000 mL | Freq: Four times a day (QID) | RESPIRATORY_TRACT | 0 refills | Status: DC

## 2017-10-15 NOTE — Patient Instructions (Signed)
1. Check oxygen tubings frequently to make sure it's not kinked 2. Please use Albuterol as directed 3. Follow up with Pulmonary specialist on 10/21/2017  Chronic Obstructive Pulmonary Disease Chronic obstructive pulmonary disease (COPD) is a long-term (chronic) lung problem. When you have COPD, it is hard for air to get in and out of your lungs. The way your lungs work will never return to normal. Usually the condition gets worse over time. There are things you can do to keep yourself as healthy as possible. Your doctor may treat your condition with:  Medicines.  Quitting smoking, if you smoke.  Rehabilitation. This may involve a team of specialists.  Oxygen.  Exercise and changes to your diet.  Lung surgery.  Comfort measures (palliative care).  Follow these instructions at home: Medicines  Take over-the-counter and prescription medicines only as told by your doctor.  Talk to your doctor before taking any cough or allergy medicines. You may need to avoid medicines that cause your lungs to be dry. Lifestyle  If you smoke, stop. Smoking makes the problem worse. If you need help quitting, ask your doctor.  Avoid being around things that make your breathing worse. This may include smoke, chemicals, and fumes.  Stay active, but remember to also rest.  Learn and use tips on how to relax.  Make sure you get enough sleep. Most adults need at least 7 hours a night.  Eat healthy foods. Eat smaller meals more often. Rest before meals. Controlled breathing  Learn and use tips on how to control your breathing as told by your doctor. Try: ? Breathing in (inhaling) through your nose for 1 second. Then, pucker your lips and breath out (exhale) through your lips for 2 seconds. ? Putting one hand on your belly (abdomen). Breathe in slowly through your nose for 1 second. Your hand on your belly should move out. Pucker your lips and breathe out slowly through your lips. Your hand on your  belly should move in as you breathe out. Controlled coughing  Learn and use controlled coughing to clear mucus from your lungs. The steps are: 1. Lean your head a little forward. 2. Breathe in deeply. 3. Try to hold your breath for 3 seconds. 4. Keep your mouth slightly open while coughing 2 times. 5. Spit any mucus out into a tissue. 6. Rest and do the steps again 1 or 2 times as needed. General instructions  Make sure you get all the shots (vaccines) that your doctor recommends. Ask your doctor about a flu shot and a pneumonia shot.  Use oxygen therapy and therapy to help improve your lungs (pulmonary rehabilitation) if told by your doctor. If you need home oxygen therapy, ask your doctor if you should buy a tool to measure your oxygen level (oximeter).  Make a COPD action plan with your doctor. This helps you know what to do if you feel worse than usual.  Manage any other conditions you have as told by your doctor.  Avoid going outside when it is very hot, cold, or humid.  Avoid people who have a sickness you can catch (contagious).  Keep all follow-up visits as told by your doctor. This is important. Contact a doctor if:  You cough up more mucus than usual.  There is a change in the color or thickness of the mucus.  It is harder to breathe than usual.  Your breathing is faster than usual.  You have trouble sleeping.  You need to use your medicines  more often than usual.  You have trouble doing your normal activities such as getting dressed or walking around the house. Get help right away if:  You have shortness of breath while resting.  You have shortness of breath that stops you from: ? Being able to talk. ? Doing normal activities.  Your chest hurts for longer than 5 minutes.  Your skin color is more blue than usual.  Your pulse oximeter shows that you have low oxygen for longer than 5 minutes.  You have a fever.  You feel too tired to breathe  normally. Summary  Chronic obstructive pulmonary disease (COPD) is a long-term lung problem.  The way your lungs work will never return to normal. Usually the condition gets worse over time. There are things you can do to keep yourself as healthy as possible.  Take over-the-counter and prescription medicines only as told by your doctor.  If you smoke, stop. Smoking makes the problem worse. This information is not intended to replace advice given to you by your health care provider. Make sure you discuss any questions you have with your health care provider. Document Released: 09/10/2007 Document Revised: 08/30/2015 Document Reviewed: 11/18/2012 Elsevier Interactive Patient Education  2017 [employer]

## 2017-10-15 NOTE — Progress Notes (Signed)
Location:  [facility]   Place of Service:  Clinic (12) Provider: Tyce Delcid FNP-C  Yoandri Congrove, Donalee Citrininah C, NP  Patient Care Team: Ianna Salmela, Donalee Citrininah C, NP as PCP - General (Family Medicine)  Extended Emergency Contact Information Primary Emergency Contact: Norm ParcelGilmore,Edie  [place] Mobile Phone: [phone] Relation: Daughter Secondary Emergency Contact: Bernette Redbirdavenport,Nancy  [place] Mobile Phone: [phone] Relation: Daughter  Code Status:  DNR Goals of care: Advanced Directive information Advanced Directives 10/15/2017  Does Patient Have a Medical Advance Directive? Yes  Type of Estate agentAdvance Directive Healthcare Power of Willow StreetAttorney;Out of facility DNR (pink MOST or yellow form);Living will  Does patient want to make changes to medical advance directive? -  Copy of Healthcare Power of Attorney in Chart? Yes  Would patient like information on creating a medical advance directive? -  Pre-existing out of facility DNR order (yellow form or pink MOST form) Yellow form placed in chart (order not valid for inpatient use)     Chief Complaint  Patient presents with  . Acute Visit    2 follow up on breathing    HPI:  Pt is a 84 y.o. female [facility] for follow up shortness of breath.she was seen here in the clinic on 10/13/2017 due to increased shortness of breath.Respiration was 28 b/min and oxygen saturation was 90 % on 4 liters nasal cannula.ER was recommended but patient declined.Portable CXR 2 views,Duonebs every 6 hours x 5 days and furosemide 20 mg tablet increased to twice daily.Chest X-ray results showed no acute abnormalities.Patient's states feeling much better with Furosemide 20 mg tablet twice daily.she had CBC/diff and CMP blood work this morning results pending. She states her Duoneb treatment was not delivered by pharmacy.CMA at the office contacted patient's pharmacy and was informed that Duoneb was not  covered by part D of patient's insurance but covered by part B which can be filled at [facility].Patient's daughter was also contacted [facility].Patient's daughter expressed concerns to CMA that patient has not been using her Albuterol inhaler as directed.patient states did not use the inhaler today since he had blood work to be done.I've encouraged her to use inhalers as directed. Patient's oxygen tubings also noted to be kinked during the visit. She denies any cough,fever or chills during visit.    Past Medical History:  Diagnosis Date  . Anticoagulated    Eliquis  . Atrial flutter (HCC)    NO CARDIOLOGIST FOLLOWED BY DR Tori MilksEDWIN GREENE  . Bloody stools    LAST 4 DAYS  . CHF (congestive heart failure) (HCC)   . Chronic back pain   . CKD (chronic kidney disease), stage III (HCC)    Creatinine 1.35 -1.55  . COPD (chronic obstructive pulmonary disease) (HCC)   . Diverticulosis   . Dyspnea   . Dysrhythmia   . Edema   . Gait disorder   . Glaucoma    Severe  . Glaucoma    both eyes  . High cholesterol   . Multinodular goiter   . PAOD (peripheral arterial occlusive disease) (HCC)    Stein in right groin, decrease pulses  . PMR (polymyalgia rheumatica) (HCC)   . Pneumonia, organism unspecified(486) 04/18/2016  . Protein-calorie malnutrition (HCC)   . Rash of back   . Weakness    Past Surgical History:  Procedure Laterality Date  . COLONOSCOPY N/A 07/15/2013   Procedure: COLONOSCOPY;  Surgeon: Barbette Hairobert D  Arlyce Dice, MD;  Location: [facility];  Service: Endoscopy;  Laterality: N/A;  . ESOPHAGOGASTRODUODENOSCOPY N/A 07/08/2013   Procedure: ESOPHAGOGASTRODUODENOSCOPY (EGD);  Surgeon: Louis Meckel, MD;  Location: [facility];  Service: Endoscopy;  Laterality: N/A;  Per Connye Burkitt, will try to have CRNA available, but will not guaranteee. Ok to schedule per Clydie Braun 07/06/13  . GALLBLADDER SURGERY    . IR GENERIC HISTORICAL  05/18/2014   IR  RADIOLOGIST EVAL & MGMT 05/18/2014 Berdine Dance, MD [facility] INTERV RAD  . LAMINECTOMY    . leg stent Right   . SHOULDER SURGERY     frozen shoulder/ Bil shoulders    Allergies  Allergen Reactions  . Celebrex [Celecoxib]   . Codeine Sulfate Nausea And Vomiting and Other (See Comments)    headache  . Evista [Raloxifene Hcl]   . Fosamax [Alendronate Sodium]     Outpatient Encounter Medications as of 10/15/2017  Medication Sig  . albuterol (PROVENTIL HFA;VENTOLIN HFA) 108 (90 Base) MCG/ACT inhaler Inhale 1 puff into the lungs every 6 (six) hours as needed for wheezing or shortness of breath.  Marland Kitchen albuterol (PROVENTIL) (2.5 MG/3ML) 0.083% nebulizer solution Take 3 mLs (2.5 mg total) by nebulization every 6 (six) hours as needed for wheezing or shortness of breath. DX: J44.9  . atorvastatin (LIPITOR) 10 MG tablet Take 5 mg by mouth daily.   . benzonatate (TESSALON) 100 MG capsule Take 1 capsule (100 mg total) by mouth 3 (three) times daily as needed for cough.  . bimatoprost (LUMIGAN) 0.01 % SOLN Place 1 drop into both eyes at bedtime.  . cholecalciferol (VITAMIN D) 1000 UNITS tablet Take 1,000 Units by mouth at bedtime.   Marland Kitchen diltiazem (CARDIZEM CD) 360 MG 24 hr capsule Take 1 capsule (360 mg total) by mouth daily.  Marland Kitchen ELIQUIS 2.5 MG TABS tablet Take 2.5 mg by mouth 2 (two) times daily.  . furosemide (LASIX) 20 MG tablet Take 1 tablet (20 mg total) by mouth 2 (two) times daily.  . Nutritional Supplements (BOOST PLUS PO) Take 1 Can by mouth 2 (two) times daily between meals.  . OXYGEN Inhale 2 L into the lungs daily.  . potassium chloride (K-DUR,KLOR-CON) 10 MEQ tablet Take 10 mEq by mouth 2 (two) times daily.  Marland Kitchen tetrahydrozoline 0.05 % ophthalmic solution Place 1 drop into both eyes every 4 (four) hours as needed.  . umeclidinium-vilanterol (ANORO ELLIPTA) 62.5-25 MCG/INH AEPB Inhale 1 puff into the lungs daily.  . vitamin E 1000 UNIT capsule Take 500 Units by mouth 2 (two) times daily.   Marland Kitchen  ipratropium-albuterol (DUONEB) 0.5-2.5 (3) MG/3ML SOLN Take 3 mLs by nebulization every 6 (six) hours for 5 days.  . [DISCONTINUED] ipratropium-albuterol (DUONEB) 0.5-2.5 (3) MG/3ML SOLN Take 3 mLs by nebulization every 6 (six) hours for 5 days. (Patient not taking: Reported on 10/15/2017)   No facility-administered encounter medications on file as of 10/15/2017.     Review of Systems  Constitutional: Negative for appetite change, chills, fatigue and fever.  HENT: Negative for congestion, rhinorrhea, sinus pressure, sinus pain, sneezing and sore throat.   Eyes: Positive for visual disturbance. Negative for discharge, redness and itching.  Respiratory: Positive for shortness of breath. Negative for cough, chest tightness and wheezing.   Cardiovascular: Negative for chest pain, palpitations and leg swelling.  Gastrointestinal: Negative for abdominal distention, abdominal pain, constipation, diarrhea, nausea and vomiting.  Skin: Negative for color change, pallor and rash.  Neurological: Negative for dizziness, light-headedness and headaches.  Psychiatric/Behavioral: Negative for  agitation, confusion and sleep disturbance. The patient is not nervous/anxious.     Immunization History  Administered Date(s) Administered  . Influenza Split 12/30/2016  . Influenza, High Dose Seasonal PF 01/05/2016  . Influenza-Unspecified 12/06/2013  . Pneumococcal Polysaccharide-23 04/07/1994  . Zoster 05/07/1989   Pertinent  Health Maintenance Due  Topic Date Due  . PNA vac Low Risk Adult (1 of 2 - PCV13) 04/08/1995  . INFLUENZA VACCINE  11/05/2017  . DEXA SCAN  Completed   No flowsheet data found. Functional Status Survey:    Vitals:   10/15/17 0927  BP: 114/60  Pulse: 76  Resp: (!) 24  Temp: (!) 97.3 F (36.3 C)  SpO2: 96%  Weight: 112 lb 3.2 oz (50.9 kg)  Height: 5\' 3"  (1.6 m)   Body mass index is 19.88 kg/m. Physical Exam  Constitutional:  Thin built Elderly in no acute distress   HENT:   Head: Normocephalic.  Mouth/Throat: Oropharynx is clear and moist. No oropharyngeal exudate.  Eyes: Pupils are equal, round, and reactive to light. Conjunctivae and EOM are normal. Right eye exhibits no discharge. Left eye exhibits no discharge. No scleral icterus.  Neck: Normal range of motion. No JVD present. No thyromegaly present.  Cardiovascular: Normal rate, regular rhythm, normal heart sounds and intact distal pulses. Exam reveals no gallop and no friction rub.  No murmur heard. Pulmonary/Chest: She has no wheezes. She has no rales.  Bilateral diminished breath sounds.oxygen 3 liters via nasal cannula.Oxygen tubings noted to be kinked during visit.breathing improved when tubing was fixed.Resp on arrival was 30 b/min but improved when oxygen tubings was fixed resp down to 24 b/min.  Abdominal: Soft. Bowel sounds are normal. She exhibits no distension and no mass. There is no tenderness. There is no guarding.  Musculoskeletal: She exhibits no edema or tenderness.  Unsteady gait uses scooter.  Lymphadenopathy:    She has no cervical adenopathy.  Skin: Skin is warm and dry. No rash noted. No erythema. No pallor.  Psychiatric: She has a normal mood and affect. Her speech is normal and behavior is normal. Judgment and thought content normal.  Vitals reviewed.   Labs reviewed: Recent Labs    01/13/17 0534  08/25/17 0300 08/26/17 0319 08/27/17 0524 09/03/17  NA 137   < > 135 141 141 142  K 5.3*   < > 3.8 3.6 3.6 4.5  CL 103   < > 99* 108 106  --   CO2 23   < > 21* 20* 19*  --   GLUCOSE 152*   < > 201* 156* 166*  --   BUN 36*   < > 27* 40* 50* 30*  CREATININE 1.87*   < > 1.90* 1.49* 1.60* 1.52  CALCIUM 9.0   < > 8.4* 8.7* 9.0 8.5  MG 2.3  --   --   --   --   --    < > = values in this interval not displayed.   Recent Labs    08/24/17 1211 09/03/17  AST 25 19  ALT 25 53  ALKPHOS 80 58  BILITOT 0.8 0.6  PROT 8.0  --   ALBUMIN 3.4* 3.3   Recent Labs    01/12/17 1507  08/24/17 1211 08/25/17 0300 08/26/17 0319 08/27/17 0524 09/03/17 09/10/17  WBC 14.4* 10.7* 6.1 19.1* 20.0* 12.7 9.2  NEUTROABS 10.7* 8.6*  --   --  18.9*  --   --   HGB 15.6* 15.2* 11.8* 11.7* 11.3* 13.3 11.7  HCT 46.0 45.0 35.9* 35.5* 34.5* 40.1 35.4  MCV 90.6 91.3 91.6 90.3 90.6 90.1 90.5  PLT 353 417* 323 417* 448*  --   --    Lab Results  Component Value Date   TSH 3.58 05/15/2016   No results found for: HGBA1C No results found for: CHOL, HDL, LDLCALC, LDLDIRECT, TRIG, CHOLHDL  Significant Diagnostic Results in last 30 days:  No results found.  Assessment/Plan   Chronic respiratory failure with hypoxia Afebrile.bilateral lung diminished throughout.recent CXR results negative for acute abnormalities.Resp 30 b/min on arrival though oxygen tubings was noted to be kinked respiration improved when tubings was fixed Resp 24 b/min.Encouraged patient to check tubings frequently for kinks and use inhalers as directed.I've also discussed use of inhalers and checking oxygen tubings with patient's daughter Jelesa Mangini on the phone [phone].she verbalized understanding.she states will change patient's tubings. Also discussed need for goals of care.Patient's daughter will talk with patient then make arrangement with the office to fill out patient's MOST form.she states both are familiar with the MOST form filled out in the past for the father. Patient has appointment with Pulmonary 10/21/2017 and cardiology in one week. Notify provider's office or go to ER if symptoms worsen or does not improve.   Family/ staff Communication: Reviewed plan of care with patient's daughter Roxana Lai at [phone] and patient.  Labs/tests ordered: CBC,CMP results drawn prior to visit results pending.  Caesar Bookman, NP

## 2017-10-16 NOTE — Addendum Note (Signed)
Addended byRicharda Blade: Briauna Gilmartin C on: 10/16/2017 02:30 PM   Modules accepted: Level of Service

## 2017-10-19 ENCOUNTER — Non-Acute Institutional Stay: Payer: Medicare Other | Admitting: Internal Medicine

## 2017-10-19 ENCOUNTER — Encounter: Payer: Self-pay | Admitting: Internal Medicine

## 2017-10-19 ENCOUNTER — Ambulatory Visit: Payer: Medicare Other | Admitting: Pulmonary Disease

## 2017-10-19 VITALS — BP 118/60 | HR 86 | Resp 16 | Ht 63.0 in | Wt 105.8 lb

## 2017-10-19 DIAGNOSIS — I5032 Chronic diastolic (congestive) heart failure: Secondary | ICD-10-CM

## 2017-10-19 DIAGNOSIS — J9611 Chronic respiratory failure with hypoxia: Secondary | ICD-10-CM | POA: Diagnosis not present

## 2017-10-19 DIAGNOSIS — Z7189 Other specified counseling: Secondary | ICD-10-CM

## 2017-10-19 DIAGNOSIS — N179 Acute kidney failure, unspecified: Secondary | ICD-10-CM

## 2017-10-19 DIAGNOSIS — J449 Chronic obstructive pulmonary disease, unspecified: Secondary | ICD-10-CM

## 2017-10-19 NOTE — Progress Notes (Signed)
Location:  [facility] (12) Provider:  Blanchie Serve MD  Ngetich, Nelda Bucks, NP  Patient Care Team: Ngetich, Nelda Bucks, NP as PCP - General (Family Medicine)  Extended Emergency Contact Information Primary Emergency Contact: [place] Mobile Phone: [phone] Relation: Daughter Secondary Emergency Contact: [place] Mobile Phone: [phone] Relation: Daughter  Code Status:  DNR  Goals of care: Advanced Directive information Advanced Directives 10/19/2017  Does Patient Have a Medical Advance Directive? Yes  Type of Paramedic of [place];Living will;Out of facility DNR (pink MOST or yellow form)  Does patient want to make changes to medical advance directive? No - Patient declined  Copy of [place] in Chart? Yes  Would patient like information on creating a medical advance directive? -  Pre-existing out of facility DNR order (yellow form or pink MOST form) Yellow form placed in chart (order not valid for inpatient use)     Chief Complaint  Patient presents with  . Acute Visit    goals of care, copd, breathing issues    HPI:  Pt is a 84 y.o. female seen today for acute visit. She is here with her daughter. She was in SNF for rehabilitation post acute on chronic respiratory failure with copd exacerbation. She was then discharged back to her apartment and was seen in clinic with dyspnea. Pneumonia and copd exacerbation were ruled out. Fixing the tubing helped ease her breathing. She denies any worsening of her breathing. On lab review, renal function has worsened.    Past Medical History:  Diagnosis Date  . Anticoagulated    Eliquis  . Atrial flutter ([place])    NO CARDIOLOGIST FOLLOWED BY DR Conni Slipper  . Bloody stools    LAST 4 DAYS  . CHF (congestive heart failure) ([place])   . Chronic back pain   . CKD (chronic kidney disease),  stage III (HCC)    Creatinine 1.35 -1.55  . COPD (chronic obstructive pulmonary disease) ([place])   . Diverticulosis   . Dyspnea   . Dysrhythmia   . Edema   . Gait disorder   . Glaucoma    Severe  . Glaucoma    both eyes  . High cholesterol   . Multinodular goiter   . PAOD (peripheral arterial occlusive disease) (HCC)    Stein in right groin, decrease pulses  . PMR (polymyalgia rheumatica) (HCC)   . Pneumonia, organism unspecified(486) 04/18/2016  . Protein-calorie malnutrition ([place])   . Rash of back   . Weakness    Past Surgical History:  Procedure Laterality Date  . COLONOSCOPY N/A 07/15/2013   Procedure: COLONOSCOPY;  Surgeon: Inda Castle, MD;  Location: [facility];  Service: Endoscopy;  Laterality: N/A;  . ESOPHAGOGASTRODUODENOSCOPY N/A 07/08/2013   Procedure: ESOPHAGOGASTRODUODENOSCOPY (EGD);  Surgeon: Inda Castle, MD;  Location: [facility];  Service: Endoscopy;  Laterality: N/A;  Per Gonzella Lex, will try to have CRNA available, but will not guaranteee. Ok to schedule per Santiago Glad 07/06/13  . GALLBLADDER SURGERY    . IR GENERIC HISTORICAL  05/18/2014   IR RADIOLOGIST EVAL & MGMT 05/18/2014 Greggory Keen, MD [facility] INTERV RAD  . LAMINECTOMY    . leg stent Right   . SHOULDER SURGERY     frozen shoulder/ Bil shoulders    Allergies  Allergen Reactions  . Celebrex [Celecoxib]   . Codeine Sulfate Nausea And Vomiting and Other (See  Comments)    headache  . Evista [Raloxifene Hcl]   . Fosamax [Alendronate Sodium]     Outpatient Encounter Medications as of 10/19/2017  Medication Sig  . albuterol (PROVENTIL HFA;VENTOLIN HFA) 108 (90 Base) MCG/ACT inhaler Inhale 1 puff into the lungs every 6 (six) hours as needed for wheezing or shortness of breath.  Marland Kitchen albuterol (PROVENTIL) (2.5 MG/3ML) 0.083% nebulizer solution Take 3 mLs (2.5 mg total) by nebulization every 6 (six) hours as needed for wheezing or shortness of breath. DX: J44.9  . atorvastatin (LIPITOR) 10 MG tablet Take  5 mg by mouth daily.   . bimatoprost (LUMIGAN) 0.01 % SOLN Place 1 drop into both eyes at bedtime.  . cholecalciferol (VITAMIN D) 1000 UNITS tablet Take 1,000 Units by mouth at bedtime.   Marland Kitchen diltiazem (CARDIZEM CD) 360 MG 24 hr capsule Take 1 capsule (360 mg total) by mouth daily.  Marland Kitchen ELIQUIS 2.5 MG TABS tablet Take 2.5 mg by mouth 2 (two) times daily.  . furosemide (LASIX) 20 MG tablet Take 1 tablet (20 mg total) by mouth 2 (two) times daily.  . Nutritional Supplements (BOOST PLUS PO) Take 1 Can by mouth 2 (two) times daily between meals.  . OXYGEN Inhale 4 L into the lungs daily.   . potassium chloride (K-DUR,KLOR-CON) 10 MEQ tablet Take 10 mEq by mouth 2 (two) times daily.  Marland Kitchen tetrahydrozoline 0.05 % ophthalmic solution Place 1 drop into both eyes every 4 (four) hours as needed.  . umeclidinium-vilanterol (ANORO ELLIPTA) 62.5-25 MCG/INH AEPB Inhale 1 puff into the lungs daily.  . vitamin E 1000 UNIT capsule Take 500 Units by mouth 2 (two) times daily.   . [DISCONTINUED] ipratropium-albuterol (DUONEB) 0.5-2.5 (3) MG/3ML SOLN Take 3 mLs by nebulization every 6 (six) hours for 5 days.  . [DISCONTINUED] benzonatate (TESSALON) 100 MG capsule Take 1 capsule (100 mg total) by mouth 3 (three) times daily as needed for cough. (Patient not taking: Reported on 10/19/2017)   No facility-administered encounter medications on file as of 10/19/2017.     Review of Systems  Constitutional: Positive for fatigue. Negative for appetite change, chills and fever.  HENT: Negative for congestion, mouth sores, postnasal drip and trouble swallowing.   Eyes: Positive for visual disturbance.  Respiratory: Positive for shortness of breath. Negative for cough.   Cardiovascular: Negative for chest pain and palpitations.       Improved leg edema  Gastrointestinal: Negative for abdominal pain, constipation and nausea.  Genitourinary: Negative for dysuria, flank pain, hematuria and pelvic pain.  Musculoskeletal: Positive for  gait problem. Negative for back pain.  Neurological: Negative for dizziness and headaches.  Psychiatric/Behavioral: Negative for behavioral problems and confusion.    Immunization History  Administered Date(s) Administered  . Influenza Split 12/30/2016  . Influenza, High Dose Seasonal PF 01/05/2016  . Influenza-Unspecified 12/06/2013  . Pneumococcal Polysaccharide-23 04/07/1994  . Zoster 05/07/1989   Pertinent  Health Maintenance Due  Topic Date Due  . PNA vac Low Risk Adult (1 of 2 - PCV13) 04/08/1995  . INFLUENZA VACCINE  11/05/2017  . DEXA SCAN  Completed   No flowsheet data found. Functional Status Survey:    Vitals:   10/19/17 1436  BP: 118/60  Pulse: 86  Resp: 16  SpO2: 90%  Weight: 105 lb 12.8 oz (48 kg)  Height: '5\' 3"'$  (1.6 m)   Body mass index is 18.74 kg/m.   Wt Readings from Last 3 Encounters:  10/19/17 105 lb 12.8 oz (48 kg)  10/15/17  112 lb 3.2 oz (50.9 kg)  10/13/17 111 lb (50.3 kg)   Physical Exam  Constitutional: She is oriented to person, place, and time. No distress.  Thin built, frail elderly female  HENT:  Head: Normocephalic and atraumatic.  Mouth/Throat: Oropharynx is clear and moist.  Eyes: Pupils are equal, round, and reactive to light. Conjunctivae are normal.  Neck: Normal range of motion. Neck supple.  Cardiovascular: Normal rate and regular rhythm.  Pulmonary/Chest: No respiratory distress. She has no wheezes. She has no rales.  Mouth breathing, poor air movement, on 4 liter o2 by nasal canula  Abdominal: Soft. Bowel sounds are normal.  Musculoskeletal: She exhibits no edema.  Can move all 4 extremities, uses motorized scooter  Neurological: She is alert and oriented to person, place, and time.  Skin: Skin is warm and dry. She is not diaphoretic.    Labs reviewed: Recent Labs    01/13/17 0534  08/26/17 0319 08/27/17 0524 09/03/17 10/14/17 0000  NA 137   < > 141 141 142 141  K 5.3*   < > 3.6 3.6 4.5 4.0  CL 103   < > 108 106   --  106  CO2 23   < > 20* 19*  --  24  GLUCOSE 152*   < > 156* 166*  --  88  BUN 36*   < > 40* 50* 30* 32*  CREATININE 1.87*   < > 1.49* 1.60* 1.52 2.05*  CALCIUM 9.0   < > 8.7* 9.0 8.5 9.4  MG 2.3  --   --   --   --   --    < > = values in this interval not displayed.   Recent Labs    08/24/17 1211 09/03/17 10/14/17 0000  AST '25 19 19  '$ ALT 25 53 13  ALKPHOS 80 58  --   BILITOT 0.8 0.6 0.9  PROT 8.0  --  6.4  ALBUMIN 3.4* 3.3  --    Recent Labs    08/24/17 1211  08/26/17 0319 08/27/17 0524 09/03/17 09/10/17 10/14/17 0000  WBC 10.7*   < > 19.1* 20.0* 12.7 9.2 9.6  NEUTROABS 8.6*  --   --  18.9*  --   --  6,614  HGB 15.2*   < > 11.7* 11.3* 13.3 11.7 14.1  HCT 45.0   < > 35.5* 34.5* 40.1 35.4 41.6  MCV 91.3   < > 90.3 90.6 90.1 90.5 90.2  PLT 417*   < > 417* 448*  --   --  323   < > = values in this interval not displayed.   Lab Results  Component Value Date   TSH 3.58 05/15/2016   No results found for: HGBA1C No results found for: CHOL, HDL, LDLCALC, LDLDIRECT, TRIG, CHOLHDL  Significant Diagnostic Results in last 30 days:  No results found.  Assessment/Plan 1. Acute renal failure, unspecified acute renal failure type ([place]) Has ckd stage 3-4. Worsening renal function on recent lab. Reviewed med list. Good urine output. No signs of dehydration this visit. Encouraged fluid intake. Decrease lasix to 20 mg daily from bid and monitor  2. Chronic diastolic congestive heart failure (HCC) Decrease lasix to 20 mg daily from bid. Continue diltiazem.   3. Chronic respiratory failure with hypoxia (HCC) Continue oxygen by nasal canula. Continue bronchodilator treatment. Monitor for signs of exacerbation.   4. Chronic obstructive pulmonary disease, unspecified COPD type ([place]) Continue current medical management. Has upcoming appointment with pulmonology.  5. Goals of care, counseling/discussion Last ACP Note 07/21/2017 to 10/19/2017       ACP (Advance Care Planning) by  Blanchie Serve, MD at 10/19/2017  2:30 PM    Date of Service   Author Author Type Status Note Type File Time  10/19/2017 Addend Blanchie Serve, MD Physician Signed ACP (Advance Care Planning) 10/19/2017              Goals of care discussion Reviewed goals of care with patient and her daughter/HCPOA and myself present. Reviewed current documents. Patient has a living will and HCPOA paperwork. copies of it present in chart. Patient has a DNR form in chart. MOST form reviewed and filled out today. Patient would like to remain a DNR in absence of pulse or breath. Patient would like hospitalization with limited interventions if medical problem arises. Patient agrees to iv fluids if indicated. Patient would like antibiotic if indicated for a trial period. Patient does not want a feeding tube for nutritional support. Form filled out and reviewed and signed. I have spent time from 3:00 pm to 3:20 pm reviewing goals of care today. All questions from patient and/or family member have been answered to the best of my knowledge.                   Family/ staff Communication: reviewed care plan with patient and her daughter   Labs/tests ordered:   Lab Orders     BMP with eGFR([facility])    Blanchie Serve, MD Internal Medicine [place],  [address] Cell Phone (Monday-Friday 8 am - 5 pm): [phone] On Call: [phone] and follow prompts after 5 pm and on weekends Office Phone: [phone] Office Fax: [phone]   Boost twice a day  Taking anoro ellipta oxygen 4 liters/day

## 2017-10-19 NOTE — Patient Instructions (Signed)
  Take lasix 20 mg once a day only for now until seen by your heart doctor. I want a kidney function check in 1 week time.

## 2017-10-19 NOTE — ACP (Advance Care Planning) (Signed)
  Goals of care discussion Reviewed goals of care with patient and her daughter/HCPOA and myself present. Reviewed current documents. Patient has a living will and HCPOA paperwork. copies of it present in chart. Patient has a DNR form in chart. MOST form reviewed and filled out today. Patient would like to remain a DNR in absence of pulse or breath. Patient would like hospitalization with limited interventions if medical problem arises. Patient agrees to iv fluids if indicated. Patient would like antibiotic if indicated for a trial period. Patient does not want a feeding tube for nutritional support. Form filled out and reviewed and signed. I have spent time from 3:00 pm to 3:20 pm reviewing goals of care today. All questions from patient and/or family member have been answered to the best of my knowledge.

## 2017-10-21 ENCOUNTER — Ambulatory Visit (INDEPENDENT_AMBULATORY_CARE_PROVIDER_SITE_OTHER): Payer: Medicare Other | Admitting: Pulmonary Disease

## 2017-10-21 ENCOUNTER — Encounter: Payer: Self-pay | Admitting: Pulmonary Disease

## 2017-10-21 VITALS — BP 122/78 | HR 71 | Ht 63.5 in | Wt 109.0 lb

## 2017-10-21 DIAGNOSIS — J9611 Chronic respiratory failure with hypoxia: Secondary | ICD-10-CM

## 2017-10-21 DIAGNOSIS — J449 Chronic obstructive pulmonary disease, unspecified: Secondary | ICD-10-CM

## 2017-10-21 DIAGNOSIS — J432 Centrilobular emphysema: Secondary | ICD-10-CM | POA: Diagnosis not present

## 2017-10-21 NOTE — Progress Notes (Signed)
[facility]  Chief Complaint  Patient presents with  . Hospitalization Follow-up    Was in the hospital on 08/24/17, she stayed in the hospital for 5 days, then went to rehab , is now back in apartment now and doing better now.    Constitutional: BP 122/78 (BP Location: Left Arm, Cuff Size: Normal)   Pulse 71   Ht 5' 3.5" (1.613 m)   Wt 109 lb (49.4 kg)   SpO2 99%   BMI 19.01 kg/m   History of Present Illness: [name] is a 84 y.o. female former smoker with COPD/emphysema and chronic respiratory failure.  She was in hospital in May with COPD flare.  Living in [facility] now.  Using 4 liters oxygen 24/7.  Gets winded easily.  Not having cough, wheeze, sputum, chest pain, fever, or hemoptysis.  Uses anoro daily.  Not needing nebulizer as much.   Comprehensive Respiratory Exam:  Appearance - thin, wearing oxygen ENMT - nasal mucosa moist, turbinates clear, midline nasal septum, no dental lesions, no gingival bleeding, no oral exudates, no tonsillar hypertrophy Neck - no masses, trachea midline, no thyromegaly, no elevation in JVP Respiratory - normal appearance of chest wall, normal respiratory effort w/o accessory muscle use, decreased breath sounds, prolonged exhalation, no dullness on percussion, no wheezing or rales CV - s1s2 regular rate and rhythm, no murmurs, no peripheral edema, radial pulses symmetric GI - soft, non tender Lymph - no adenopathy noted in neck and axillary areas MSK - normal muscle strength and tone, uses a walker Ext - no cyanosis, clubbing, or joint inflammation noted Skin - no rashes, lesions, or ulcers Neuro - oriented to person, place, and time Psych - normal mood and affect   Assessment/Plan:  COPD with emphysema. - continue anoro, prn albuterol - discussed symptoms to monitor for her her COPD is flaring up again  Chronic respiratory failure with hypoxia. - 4 liters oxygen 24/7  Goals of  care. - DNR/DNI   Patient Instructions  Follow up in 6 months    Coralyn Helling, MD [facility] Pulmonary/Critical Care 10/21/2017, 12:36 PM  Flow Sheet  Pulmonary tests: Spirometry 04/17/16 >> FEV1 1.15 (70%), FEV1% 60 Ambulatory oximetry 07/18/16 >> 82% after 2 laps on room air CT chest 07/29/16 >> marked emphysema, CM, atherosclerosis  Cardiac tests Echo 08/26/17 >> EF 60 to 65%, mild MR, mod RV dilation, PAS 57 mmHg  Past Medical History: She  has a past medical history of Anticoagulated, Atrial flutter (HCC), Bloody stools, CHF (congestive heart failure) (HCC), Chronic back pain, CKD (chronic kidney disease), stage III (HCC), COPD (chronic obstructive pulmonary disease) (HCC), Diverticulosis, Dyspnea, Dysrhythmia, Edema, Gait disorder, Glaucoma, Glaucoma, High cholesterol, Multinodular goiter, PAOD (peripheral arterial occlusive disease) (HCC), PMR (polymyalgia rheumatica) (HCC), Pneumonia, organism unspecified(486) (04/18/2016), Protein-calorie malnutrition (HCC), Rash of back, and Weakness.  Past Surgical History: She  has a past surgical history that includes Gallbladder surgery; leg stent (Right); Laminectomy; Esophagogastroduodenoscopy (N/A, 07/08/2013); Shoulder surgery; Colonoscopy (N/A, 07/15/2013); and ir generic historical (05/18/2014).  Family History: Her family history includes Cirrhosis in her maternal aunt; Diabetes in her maternal uncle; Throat cancer in her maternal grandfather and mother.  Social History: She  reports that she quit smoking about 24 years ago. Her smoking use included cigarettes. She has a 15.00 pack-year smoking history. She has never used smokeless tobacco. She reports that she does not drink alcohol or use drugs.  Medications: Allergies as of 10/21/2017  Reactions   Celebrex [celecoxib]    Codeine Sulfate Nausea And Vomiting, Other (See Comments)   headache   Evista [raloxifene Hcl]    Fosamax [alendronate Sodium]       Medication List         Accurate as of 10/21/17 12:36 PM. Always use your most recent med list.          albuterol 108 (90 Base) MCG/ACT inhaler Commonly known as:  PROVENTIL HFA;VENTOLIN HFA Inhale 1 puff into the lungs every 6 (six) hours as needed for wheezing or shortness of breath.   albuterol (2.5 MG/3ML) 0.083% nebulizer solution Commonly known as:  PROVENTIL Take 3 mLs (2.5 mg total) by nebulization every 6 (six) hours as needed for wheezing or shortness of breath. DX: J44.9   atorvastatin 10 MG tablet Commonly known as:  LIPITOR Take 5 mg by mouth daily.   bimatoprost 0.01 % Soln Commonly known as:  LUMIGAN Place 1 drop into both eyes at bedtime.   BOOST PLUS PO Take 1 Can by mouth 2 (two) times daily between meals.   cholecalciferol 1000 units tablet Commonly known as:  VITAMIN D Take 1,000 Units by mouth at bedtime.   diltiazem 360 MG 24 hr capsule Commonly known as:  CARDIZEM CD Take 1 capsule (360 mg total) by mouth daily.   ELIQUIS 2.5 MG Tabs tablet Generic drug:  apixaban Take 2.5 mg by mouth 2 (two) times daily.   furosemide 20 MG tablet Commonly known as:  LASIX Take 1 tablet (20 mg total) by mouth 2 (two) times daily.   OXYGEN Inhale 4 L into the lungs daily.   potassium chloride 10 MEQ tablet Commonly known as:  K-DUR,KLOR-CON Take 10 mEq by mouth 2 (two) times daily.   tetrahydrozoline 0.05 % ophthalmic solution Place 1 drop into both eyes every 4 (four) hours as needed.   umeclidinium-vilanterol 62.5-25 MCG/INH Aepb Commonly known as:  ANORO ELLIPTA Inhale 1 puff into the lungs daily.   vitamin E 1000 UNIT capsule Take 500 Units by mouth 2 (two) times daily.

## 2017-10-21 NOTE — Patient Instructions (Signed)
Follow up in 6 months 

## 2017-10-21 NOTE — Progress Notes (Signed)
Cardiology Office Note   Date:  10/22/2017   ID:  LEISEL PINETTE, DOB 1927/12/08, MRN [id number]  PCP:  Caesar Bookman, NP  Cardiologist: Dr. Swaziland     No chief complaint on file.    History of Present Illness: [name] is a 84 y.o. female who presents for ongoing assessment and management of PAF, on Eliquis, chronic diastolic CHF, PAD with carotid disease, CKD and O2 dependent O2. He was last seen by Dr. Swaziland after hospitalized in 02/2017 for respiratory failure and hypoxia. She was diuresed due to volume overload. She is a resident of [facility] [address].   She is accompanied by her daughter who is very attentive and has multiple questions. The patient denies complaints of chest pain, dizziness, bleeding or excessive fatigue. She is pacing herself when she walks in her apartment and if she tires she rests. She is continue on O2 now at 4 liters.   She has had recent labs on 10/14/2017 with elevated creatinine of 2.05. She was asked by PCP to decrease lasix from 20 mg BID to 20 mg daily. She has done so for the last two days  Past Medical History:  Diagnosis Date  . Anticoagulated    Eliquis  . Atrial flutter (HCC)    NO CARDIOLOGIST FOLLOWED BY DR Tori Milks  . Bloody stools    LAST 4 DAYS  . CHF (congestive heart failure) (HCC)   . Chronic back pain   . CKD (chronic kidney disease), stage III (HCC)    Creatinine 1.35 -1.55  . COPD (chronic obstructive pulmonary disease) (HCC)   . Diverticulosis   . Dyspnea   . Dysrhythmia   . Edema   . Gait disorder   . Glaucoma    Severe  . Glaucoma    both eyes  . High cholesterol   . Multinodular goiter   . PAOD (peripheral arterial occlusive disease) (HCC)    Stein in right groin, decrease pulses  . PMR (polymyalgia rheumatica) (HCC)   . Pneumonia, organism unspecified(486) 04/18/2016  . Protein-calorie malnutrition (HCC)   . Rash of back   . Weakness     Past Surgical History:  Procedure Laterality Date  .  COLONOSCOPY N/A 07/15/2013   Procedure: COLONOSCOPY;  Surgeon: Louis Meckel, MD;  Location: [facility] ENDOSCOPY;  Service: Endoscopy;  Laterality: N/A;  . ESOPHAGOGASTRODUODENOSCOPY N/A 07/08/2013   Procedure: ESOPHAGOGASTRODUODENOSCOPY (EGD);  Surgeon: Louis Meckel, MD;  Location: [facility];  Service: Endoscopy;  Laterality: N/A;  Per Connye Burkitt, will try to have CRNA available, but will not guaranteee. Ok to schedule per Clydie Braun 07/06/13  . GALLBLADDER SURGERY    . IR GENERIC HISTORICAL  05/18/2014   IR RADIOLOGIST EVAL & MGMT 05/18/2014 Berdine Dance, MD [facility] INTERV RAD  . LAMINECTOMY    . leg stent Right   . SHOULDER SURGERY     frozen shoulder/ Bil shoulders     Current Outpatient Medications  Medication Sig Dispense Refill  . albuterol (PROVENTIL HFA;VENTOLIN HFA) 108 (90 Base) MCG/ACT inhaler Inhale 1 puff into the lungs every 6 (six) hours as needed for wheezing or shortness of breath.    Marland Kitchen albuterol (PROVENTIL) (2.5 MG/3ML) 0.083% nebulizer solution Take 3 mLs (2.5 mg total) by nebulization every 6 (six) hours as needed for wheezing or shortness of breath. DX: J44.9 (Patient taking differently: Take 2.5 mg by nebulization as needed for wheezing or shortness of breath. DX: J44.9) 75 mL 5  . atorvastatin (LIPITOR)  10 MG tablet Take 5 mg by mouth daily.     . bimatoprost (LUMIGAN) 0.01 % SOLN Place 1 drop into both eyes at bedtime.    . cholecalciferol (VITAMIN D) 1000 UNITS tablet Take 1,000 Units by mouth at bedtime.     Marland Kitchen. diltiazem (CARDIZEM CD) 360 MG 24 hr capsule Take 1 capsule (360 mg total) by mouth daily. 30 capsule 0  . ELIQUIS 2.5 MG TABS tablet Take 2.5 mg by mouth 2 (two) times daily.    . furosemide (LASIX) 20 MG tablet Take 1 tablet (20 mg total) by mouth 2 (two) times daily. (Patient taking differently: Take 20 mg by mouth daily. ) 30 tablet 0  . Nutritional Supplements (BOOST PLUS PO) Take 1 Can by mouth 2 (two) times daily between meals.    . OXYGEN Inhale 4 L into the  lungs daily.     . potassium chloride (K-DUR,KLOR-CON) 10 MEQ tablet Take 10 mEq by mouth 2 (two) times daily.    Marland Kitchen. tetrahydrozoline 0.05 % ophthalmic solution Place 1 drop into both eyes as needed.     . umeclidinium-vilanterol (ANORO ELLIPTA) 62.5-25 MCG/INH AEPB Inhale 1 puff into the lungs daily. 1 each 5  . vitamin E 1000 UNIT capsule Take 500 Units by mouth 2 (two) times daily.      No current facility-administered medications for this visit.     Allergies:   Celebrex [celecoxib]; Codeine sulfate; Evista [raloxifene hcl]; and Fosamax [alendronate sodium]    Social History:  The patient  reports that she quit smoking about 24 years ago. Her smoking use included cigarettes. She has a 15.00 pack-year smoking history. She has never used smokeless tobacco. She reports that she does not drink alcohol or use drugs.   Family History:  The patient's family history includes Cirrhosis in her maternal aunt; Diabetes in her maternal uncle; Throat cancer in her maternal grandfather and mother.    ROS: All other systems are reviewed and negative. Unless otherwise mentioned in H&P    PHYSICAL EXAM: VS:  BP 120/62   Pulse 66   Ht 5\' 4"  (1.626 m)   Wt 106 lb (48.1 kg)   SpO2 95%   BMI 18.19 kg/m  , BMI Body mass index is 18.19 kg/m. GEN: Well nourished, well developed, in no acute distress  HEENT: normal  Neck: no JVD, carotid bruits, or masses Cardiac: RRR; soft murmurs, rubs, or gallops,no edema, cyanosis in the lower extremities bilaterally.   Respiratory:  Wearing O2 via [place]. She has bilateral rales in the bases, no active wheezing.  GI: soft, nontender, nondistended, + BS MS: no deformity or atrophy  Skin: warm and dry, no rash Neuro:  Strength and sensation are intact Psych: euthymic mood, full affect   EKG: Not completed during this office visit.   Recent Labs: 01/13/2017: Magnesium 2.3 08/24/2017: B Natriuretic Peptide 789.5 10/14/2017: ALT 13; BUN 32; Creat 2.05; Hemoglobin  14.1; Platelets 323; Potassium 4.0; Sodium 141    Lipid Panel No results found for: CHOL, TRIG, HDL, CHOLHDL, VLDL, LDLCALC, LDLDIRECT    Wt Readings from Last 3 Encounters:  10/22/17 106 lb (48.1 kg)  10/21/17 109 lb (49.4 kg)  10/19/17 105 lb 12.8 oz (48 kg)      Other studies Reviewed: Echo: 07/30/16: Study Conclusions  - Left ventricle: The cavity size was normal. Wall thickness was normal. Indeterminant diastolic function (atrial flutter). Systolic function was normal. The estimated ejection fraction was in the range of 55% to  60%. Wall motion was normal; there were no regional wall motion abnormalities. - Aortic valve: There was no stenosis. - Mitral valve: There was trivial regurgitation. - Right ventricle: The cavity size was normal. Systolic function was mildly reduced. - Tricuspid valve: Peak RV-RA gradient (S): 31 mm Hg. - Pulmonary arteries: PA peak pressure: 34 mm Hg (S). - Inferior vena cava: The vessel was normal in size. The respirophasic diameter changes were in the normal range (>= 50%), consistent with normal central venous pressure.  Impressions:  - The patient was in atrial flutter. Normal LV size with EF 55-60%. Normal RV size with mildly decreased systolic function. No significant valvular abnormalities.  ASSESSMENT AND PLAN:  1. PAF: Today her heart sounds are regular. Will continue to keep her on Eliquis for now. She denies bleeding. If creatinine continues to be elevated may need to discuss need to stop Eliquis. Will have BMET drawn.   2.  CHF: Most recent EF is 60%. But diastolic function is not calculated due to atrial fib. She does not appear to be volume overloaded at this time. She will remain on lasix 20 mg daily. I have ordered a repeat BMET. She is advised that if her breathing gets worse even though she is wearing O2, she may take an extra 20 mg that day.    3.  O2 Dependent COPD:  She will follow up with pulmonary for  ongoing management.   4. Hypercholesterolemia: Continue statin therapy.   5. PAD: Due to respiratory status, she is not a candidate for intervention. No further vascular testing is planned.   Current medicines are reviewed at length with the patient today.  Daughter requests copies of labs and echo. I have printed this for her.   Labs/ tests ordered today include: BMET and CBC  Bettey Mare. Liborio Nixon, ANP, AACC   10/22/2017 11:27 AM    [facility] [address], [place], [place] [address] Phone: [phone]; Fax: [phone]

## 2017-10-22 ENCOUNTER — Ambulatory Visit (INDEPENDENT_AMBULATORY_CARE_PROVIDER_SITE_OTHER): Payer: Medicare Other | Admitting: Adult Health

## 2017-10-22 ENCOUNTER — Encounter: Payer: Self-pay | Admitting: Adult Health

## 2017-10-22 VITALS — BP 120/62 | HR 66 | Ht 64.0 in | Wt 106.0 lb

## 2017-10-22 DIAGNOSIS — I482 Chronic atrial fibrillation, unspecified: Secondary | ICD-10-CM

## 2017-10-22 DIAGNOSIS — I5032 Chronic diastolic (congestive) heart failure: Secondary | ICD-10-CM | POA: Diagnosis not present

## 2017-10-22 DIAGNOSIS — Z79899 Other long term (current) drug therapy: Secondary | ICD-10-CM

## 2017-10-22 DIAGNOSIS — J449 Chronic obstructive pulmonary disease, unspecified: Secondary | ICD-10-CM | POA: Diagnosis not present

## 2017-10-22 DIAGNOSIS — N184 Chronic kidney disease, stage 4 (severe): Secondary | ICD-10-CM

## 2017-10-22 DIAGNOSIS — I10 Essential (primary) hypertension: Secondary | ICD-10-CM | POA: Diagnosis not present

## 2017-10-22 NOTE — Patient Instructions (Signed)
Medication Instructions:  NO CHANGES- Your physician recommends that you continue on your current medications as directed. Please refer to the Current Medication list given to you today.  If you need a refill on your cardiac medications before your next appointment, please call your pharmacy.  Labwork: BMET ABD CBC TODAY HERE IN OUR OFFICE AT [facility]  Take the provided lab slips with you to the lab for your blood draw.   Follow-Up: Your physician wants you to follow-up in: 3 MONTHS WITH DR SwazilandJORDAN -OR- KATHRYN LAWRENCE (NURSE PRACTIONIER), DNP,AACC IF PRIMARY CARDIOLOGIST IS UNAVAILABLE.   Thank you for choosing [facility]!!

## 2017-10-23 ENCOUNTER — Telehealth: Payer: Self-pay | Admitting: *Deleted

## 2017-10-23 LAB — BASIC METABOLIC PANEL
BUN / CREAT RATIO: 14 (ref 12–28)
BUN: 28 mg/dL — ABNORMAL HIGH (ref 8–27)
CHLORIDE: 102 mmol/L (ref 96–106)
CO2: 20 mmol/L (ref 20–29)
Calcium: 9.6 mg/dL (ref 8.7–10.3)
Creatinine, Ser: 1.94 mg/dL — ABNORMAL HIGH (ref 0.57–1.00)
GFR calc Af Amer: 26 mL/min/{1.73_m2} — ABNORMAL LOW (ref >59)
GFR calc non Af Amer: 23 mL/min/{1.73_m2} — ABNORMAL LOW (ref >59)
GLUCOSE: 80 mg/dL (ref 65–99)
POTASSIUM: 4.6 mmol/L (ref 3.5–5.2)
Sodium: 142 mmol/L (ref 134–144)

## 2017-10-23 LAB — CBC
HEMATOCRIT: 42.3 % (ref 34.0–46.6)
Hemoglobin: 13.5 g/dL (ref 11.1–15.9)
MCH: 29.9 pg (ref 26.6–33.0)
MCHC: 31.9 g/dL (ref 31.5–35.7)
MCV: 94 fL (ref 79–97)
Platelets: 307 10*3/uL (ref 150–450)
RBC: 4.51 x10E6/uL (ref 3.77–5.28)
RDW: 15 % (ref 12.3–15.4)
WBC: 7.7 10*3/uL (ref 3.4–10.8)

## 2017-10-23 NOTE — Telephone Encounter (Signed)
FYI: Patient cancelled her upcoming lab for July 22. She had the exact same labs drawn yesterday at cardiology. The results are in her chart. Thanks!

## 2017-10-23 NOTE — Telephone Encounter (Signed)
Noted and labs reviewed

## 2017-10-26 ENCOUNTER — Other Ambulatory Visit: Payer: Self-pay

## 2017-10-26 DIAGNOSIS — N184 Chronic kidney disease, stage 4 (severe): Secondary | ICD-10-CM

## 2017-10-26 NOTE — Progress Notes (Signed)
Notes recorded by Jodelle GrossLawrence, Kathryn M, NP on 10/23/2017 at 9:19 AM EDT Although not back to baseline, she has seen some improvement in her kidney function with reduction of lasix to 20 mg over the last 3 days. Continue her current dose of lasix unless she begins to retain fluid or have more trouble breathing. Repeat labs in one week.

## 2017-11-16 ENCOUNTER — Telehealth: Payer: Self-pay | Admitting: *Deleted

## 2017-11-16 NOTE — Telephone Encounter (Signed)
This will be addressed on 11/18/17 visit.

## 2017-11-16 NOTE — Telephone Encounter (Signed)
Daughter called and asks that patient's feet be evaluated at her appt on 8/14. She says her mothers feet are always purplish/blue, cold and sometimes swollen. It's just her feet and not her ankles. She says patient always wears sandals and she is concerned that patient may injure her toes if there is any lack of feeling/circulation.

## 2017-11-18 ENCOUNTER — Encounter: Payer: Self-pay | Admitting: Internal Medicine

## 2017-11-18 ENCOUNTER — Non-Acute Institutional Stay: Payer: Medicare Other | Admitting: Internal Medicine

## 2017-11-18 VITALS — BP 118/64 | HR 60 | Temp 97.6°F | Resp 20 | Ht 64.0 in | Wt 102.6 lb

## 2017-11-18 DIAGNOSIS — J9611 Chronic respiratory failure with hypoxia: Secondary | ICD-10-CM

## 2017-11-18 DIAGNOSIS — I5032 Chronic diastolic (congestive) heart failure: Secondary | ICD-10-CM

## 2017-11-18 DIAGNOSIS — I10 Essential (primary) hypertension: Secondary | ICD-10-CM | POA: Diagnosis not present

## 2017-11-18 DIAGNOSIS — E46 Unspecified protein-calorie malnutrition: Secondary | ICD-10-CM

## 2017-11-18 DIAGNOSIS — I779 Disorder of arteries and arterioles, unspecified: Secondary | ICD-10-CM

## 2017-11-18 DIAGNOSIS — N184 Chronic kidney disease, stage 4 (severe): Secondary | ICD-10-CM

## 2017-11-18 DIAGNOSIS — E785 Hyperlipidemia, unspecified: Secondary | ICD-10-CM

## 2017-11-18 DIAGNOSIS — I480 Paroxysmal atrial fibrillation: Secondary | ICD-10-CM | POA: Diagnosis not present

## 2017-11-18 MED ORDER — ATORVASTATIN CALCIUM 10 MG PO TABS: 5.0000 mg | ORAL_TABLET | Freq: Every day | ORAL | 3 refills | Status: DC

## 2017-11-18 MED ORDER — TRAMADOL-ACETAMINOPHEN 37.5-325 MG PO TABS: 1.0000 | ORAL_TABLET | Freq: Two times a day (BID) | ORAL | 0 refills | Status: DC

## 2017-11-18 MED ORDER — DILTIAZEM HCL ER COATED BEADS 360 MG PO CP24: 360.0000 mg | ORAL_CAPSULE | Freq: Every day | ORAL | 0 refills | Status: DC

## 2017-11-18 MED ORDER — FUROSEMIDE 20 MG PO TABS: 20.0000 mg | ORAL_TABLET | Freq: Every day | ORAL | 3 refills | Status: DC | PRN

## 2017-11-18 NOTE — Progress Notes (Signed)
Location:  [facility] (12) Provider:  Blanchie Serve MD  Ngetich, Nelda Bucks, NP  Patient Care Team: Ngetich, Nelda Bucks, NP as PCP - General (Family Medicine) [place], Peter M, MD as PCP - Cardiology (Cardiology)  Extended Emergency Contact Information Primary Emergency Contact: [place] of [place] Phone: [phone] Relation: Daughter Secondary Emergency Contact: [place] of [place] Mobile Phone: [phone] Relation: Daughter  Code Status:  DNR  Goals of care: Advanced Directive information Advanced Directives 11/18/2017  Does Patient Have a Medical Advance Directive? Yes  Type of Paramedic of [place];Living will;Out of facility DNR (pink MOST or yellow form)  Does patient want to make changes to medical advance directive? No - Patient declined  Copy of [place] in Chart? Yes  Would patient like information on creating a medical advance directive? -  Pre-existing out of facility DNR order (yellow form or pink MOST form) Yellow form placed in chart (order not valid for inpatient use);Pink MOST form placed in chart (order not valid for inpatient use)     Chief Complaint  Patient presents with  . Medical Management of Chronic Issues    1 month follow up. Patient stated she has no concerns. Patient's daughter called Monday to let us know that her mother's feet were purrplish/blue and she was concerned about her mother's circulation.   . Medication Refill    Pending    HPI:  Pt is a 84 y.o. female seen today for medical management of chronic diseases. She denies any acute concern this visit.  CHF- denies worsening dyspnea. Taking lasix 20 mg daily and kcl 10 meq bid at present  afib- denies palpitation, takes diltiazem and eliquis, tolerating well  Chronic respiratory failure- on continuous oxygen, denies worsening dyspnea, able to carry out her  ADLs, mobilizes with motorized scooter.  ckd stage 4- slight improvement of renal function with decrease in dosing of lasix, no urinary complaints.   Protein calorie malnutrition- takes 1 ensure a day, appetite is fair per pt  PAD- has history of stent to right side in past, daughter mentions about bluish discoloration to her feet, per pt has had cool extremities for years and bluish discoloration for sometime as well. Denies claudication pain, open wound or sores.    Past Medical History:  Diagnosis Date  . Anticoagulated    Eliquis  . Atrial flutter ([place])    NO CARDIOLOGIST FOLLOWED BY DR Conni Slipper  . Bloody stools    LAST 4 DAYS  . CHF (congestive heart failure) ([place])   . Chronic back pain   . CKD (chronic kidney disease), stage III (HCC)    Creatinine 1.35 -1.55  . COPD (chronic obstructive pulmonary disease) ([place])   . Diverticulosis   . Dyspnea   . Dysrhythmia   . Edema   . Gait disorder   . Glaucoma    Severe  . Glaucoma    both eyes  . High cholesterol   . Multinodular goiter   . PAOD (peripheral arterial occlusive disease) (HCC)    Stein in right groin, decrease pulses  . PMR (polymyalgia rheumatica) (HCC)   . Pneumonia, organism unspecified(486) 04/18/2016  . Protein-calorie malnutrition ([place])   . Rash of back   . Weakness    Past Surgical History:  Procedure Laterality Date  . COLONOSCOPY N/A 07/15/2013   Procedure: COLONOSCOPY;  Surgeon: Inda Castle, MD;  Location: [facility];  Service: Endoscopy;  Laterality: N/A;  . ESOPHAGOGASTRODUODENOSCOPY N/A 07/08/2013   Procedure: ESOPHAGOGASTRODUODENOSCOPY (EGD);  Surgeon: Inda Castle, MD;  Location: [facility];  Service: Endoscopy;  Laterality: N/A;  Per Gonzella Lex, will try to have CRNA available, but will not guaranteee. Ok to schedule per Santiago Glad 07/06/13  . GALLBLADDER SURGERY    . IR GENERIC HISTORICAL  05/18/2014   IR RADIOLOGIST EVAL & MGMT 05/18/2014 Greggory Keen, MD [facility] INTERV RAD  .  LAMINECTOMY    . leg stent Right   . SHOULDER SURGERY     frozen shoulder/ Bil shoulders    Allergies  Allergen Reactions  . Celebrex [Celecoxib]   . Codeine Sulfate Nausea And Vomiting and Other (See Comments)    headache  . Evista [Raloxifene Hcl]   . Fosamax [Alendronate Sodium]     Outpatient Encounter Medications as of 11/18/2017  Medication Sig  . albuterol (PROVENTIL HFA;VENTOLIN HFA) 108 (90 Base) MCG/ACT inhaler Inhale 1 puff into the lungs every 6 (six) hours as needed for wheezing or shortness of breath.  Marland Kitchen albuterol (PROVENTIL) (2.5 MG/3ML) 0.083% nebulizer solution Take 3 mLs (2.5 mg total) by nebulization every 6 (six) hours as needed for wheezing or shortness of breath. DX: J44.9  . aspirin 81 MG chewable tablet Chew 81 mg by mouth daily.  Marland Kitchen atorvastatin (LIPITOR) 10 MG tablet Take 5 mg by mouth daily.   . bimatoprost (LUMIGAN) 0.01 % SOLN Place 1 drop into both eyes at bedtime.  . cholecalciferol (VITAMIN D) 1000 UNITS tablet Take 1,000 Units by mouth at bedtime.   Marland Kitchen diltiazem (CARDIZEM CD) 360 MG 24 hr capsule Take 1 capsule (360 mg total) by mouth daily.  Marland Kitchen ELIQUIS 2.5 MG TABS tablet Take 2.5 mg by mouth 2 (two) times daily.  . furosemide (LASIX) 20 MG tablet Take 20 mg by mouth daily.  . Nutritional Supplements (BOOST PLUS PO) Take 1 Can by mouth 2 (two) times daily between meals.  . OXYGEN Inhale 4 L into the lungs daily.   . potassium chloride (K-DUR,KLOR-CON) 10 MEQ tablet Take 10 mEq by mouth 2 (two) times daily.  Marland Kitchen tetrahydrozoline 0.05 % ophthalmic solution Place 1 drop into both eyes as needed.   . traMADol-acetaminophen (ULTRACET) 37.5-325 MG tablet Take 1 tablet by mouth 2 (two) times daily.  Marland Kitchen umeclidinium-vilanterol (ANORO ELLIPTA) 62.5-25 MCG/INH AEPB Inhale 1 puff into the lungs daily.  . vitamin E (VITAMIN E) 400 UNIT capsule Take 400 Units by mouth daily.  . [DISCONTINUED] furosemide (LASIX) 20 MG tablet Take 1 tablet (20 mg total) by mouth 2 (two)  times daily. (Patient taking differently: Take 20 mg by mouth daily. )  . [DISCONTINUED] vitamin E 1000 UNIT capsule Take 500 Units by mouth 2 (two) times daily.    No facility-administered encounter medications on file as of 11/18/2017.     Review of Systems  Constitutional: Negative for appetite change, chills, fatigue and fever.  HENT: Negative for congestion, ear pain, hearing loss, mouth sores, postnasal drip, rhinorrhea, sinus pressure and trouble swallowing.   Eyes: Positive for visual disturbance.  Respiratory: Positive for shortness of breath. Negative for cough and wheezing.   Cardiovascular: Negative for chest pain, palpitations and leg swelling.  Gastrointestinal: Negative for abdominal pain, blood in stool, constipation, diarrhea, nausea and vomiting.  Genitourinary: Negative for dysuria and flank pain.  Musculoskeletal: Positive for arthralgias, back pain and gait problem.       Tripped on a loose rug 1st week  of august and fell bruising her right forearm  Neurological: Negative for dizziness, numbness and headaches.  Hematological: Bruises/bleeds easily.  Psychiatric/Behavioral: Negative for behavioral problems and confusion.    Immunization History  Administered Date(s) Administered  . Influenza Split 12/30/2016  . Influenza, High Dose Seasonal PF 01/05/2016  . Influenza-Unspecified 12/06/2013  . Pneumococcal Polysaccharide-23 04/07/1994  . Zoster 05/07/1989   Pertinent  Health Maintenance Due  Topic Date Due  . PNA vac Low Risk Adult (1 of 2 - PCV13) 04/08/1995  . INFLUENZA VACCINE  11/05/2017  . DEXA SCAN  Completed   Fall Risk  11/18/2017  Falls in the past year? No   Functional Status Survey:    Vitals:   11/18/17 0902  BP: 118/64  Pulse: 60  Resp: 20  Temp: 97.6 F (36.4 C)  TempSrc: Oral  SpO2: 91%  Weight: 102 lb 9.6 oz (46.5 kg)  Height: '5\' 4"'$  (1.626 m)   Body mass index is 17.61 kg/m.   Wt Readings from Last 3 Encounters:  11/18/17 102  lb 9.6 oz (46.5 kg)  10/22/17 106 lb (48.1 kg)  10/21/17 109 lb (49.4 kg)   Physical Exam  Constitutional: She is oriented to person, place, and time. No distress.  Thin built and frail elderly female  HENT:  Head: Normocephalic and atraumatic.  Right Ear: External ear normal.  Left Ear: External ear normal.  Nose: Nose normal.  Mouth/Throat: Oropharynx is clear and moist. No oropharyngeal exudate.  Eyes: Pupils are equal, round, and reactive to light. Conjunctivae and EOM are normal. Right eye exhibits no discharge. Left eye exhibits no discharge.  Corrective glasses  Neck: Normal range of motion. Neck supple.  Cardiovascular: Normal rate, regular rhythm and intact distal pulses.  Pulmonary/Chest: Effort normal. No respiratory distress. She has no wheezes. She has no rales.  Poor air entry to lung bases, on oxygen by nasal canula  Abdominal: Soft. Bowel sounds are normal. There is no tenderness. There is no guarding.  Musculoskeletal: Normal range of motion. She exhibits no edema.  Can move all 4 extrmities, unsteady gait, uses motorized scooter for ambulation  Lymphadenopathy:    She has no cervical adenopathy.  Neurological: She is alert and oriented to person, place, and time.  Skin: Skin is warm and dry. She is not diaphoretic.  Purplish discoloration of both feet, capillary refill of about 3 seconds, cool to touch, non tender  Psychiatric: She has a normal mood and affect.    Labs reviewed: Recent Labs    01/13/17 0534  08/27/17 0524 09/03/17 10/14/17 0000 10/22/17 1148  NA 137   < > 141 142 141 142  K 5.3*   < > 3.6 4.5 4.0 4.6  CL 103   < > 106  --  106 102  CO2 23   < > 19*  --  24 20  GLUCOSE 152*   < > 166*  --  88 80  BUN 36*   < > 50* 30* 32* 28*  CREATININE 1.87*   < > 1.60* 1.52 2.05* 1.94*  CALCIUM 9.0   < > 9.0 8.5 9.4 9.6  MG 2.3  --   --   --   --   --    < > = values in this interval not displayed.   Recent Labs    08/24/17 1211 09/03/17  10/14/17 0000  AST '25 19 19  '$ ALT 25 53 13  ALKPHOS 80 58  --   BILITOT 0.8 0.6  0.9  PROT 8.0  --  6.4  ALBUMIN 3.4* 3.3  --    Recent Labs    08/24/17 1211  08/27/17 0524  09/10/17 10/14/17 0000 10/22/17 1148  WBC 10.7*   < > 20.0*   < > 9.2 9.6 7.7  NEUTROABS 8.6*  --  18.9*  --   --  6,614  --   HGB 15.2*   < > 11.3*   < > 11.7 14.1 13.5  HCT 45.0   < > 34.5*   < > 35.4 41.6 42.3  MCV 91.3   < > 90.6   < > 90.5 90.2 94  PLT 417*   < > 448*  --   --  323 307   < > = values in this interval not displayed.   Lab Results  Component Value Date   TSH 3.58 05/15/2016   No results found for: HGBA1C No results found for: CHOL, HDL, LDLCALC, LDLDIRECT, TRIG, CHOLHDL  Significant Diagnostic Results in last 30 days:  No results found.  Assessment/Plan  1. Chronic respiratory failure with hypoxia (HCC) Continue oxygen by nasal canula, her bronchodilator treatment, activity as tolerated  2. PAF (paroxysmal atrial fibrillation) (HCC) Controled HR, continue diltiazem and eliquis. Cbc reviewed. - diltiazem (CARDIZEM CD) 360 MG 24 hr capsule; Take 1 capsule (360 mg total) by mouth daily.  Dispense: 30 capsule; Refill: 0 - Lipid Panel  3. Essential hypertension Controlled BP, continue diltiazem and monitor. Check bmp - diltiazem (CARDIZEM CD) 360 MG 24 hr capsule; Take 1 capsule (360 mg total) by mouth daily.  Dispense: 30 capsule; Refill: 0  4. CKD (chronic kidney disease) stage 4, GFR 15-29 ml/min (HCC) With worsening renal function and no signs of fluid overload, change lasix from 20 mg daily to daily as needed for now and monitor, bmp in 2 weeks - furosemide (LASIX) 20 MG tablet; Take 1 tablet (20 mg total) by mouth daily as needed.  Dispense: 90 tablet; Refill: 3 - CMP with eGFR([facility]) - Lipid Panel  5. Protein-calorie malnutrition, unspecified severity ([place]) With progressive COPD, encouraged to take 2 ensure daily for now.   6. Chronic diastolic congestive heart failure  (HCC) Euvolemic, continue oxygen and diltiazem. Change lasix to 20 mg daily as needed only for now and kcl to 10 meq daily as needed while taking lasix, bmp in 2 weeks.  - furosemide (LASIX) 20 MG tablet; Take 1 tablet (20 mg total) by mouth daily as needed.  Dispense: 90 tablet; Refill: 3 - CMP with eGFR([facility])  7. PAOD (peripheral arterial occlusive disease) ([place]) Reviewed ABI from 08/01/15. She is s/p right distal SFA stent placement about 7 years back. Currently denies claudication or rest pain. Purplish discoloration of her feet and they are cool to touch. No open wound or sore. Supportive care for now. Foot care. Reviewed this in detail with pt and her daughter Nena Jordan over the phone - CMP with eGFR([facility])  8. Hyperlipidemia, unspecified hyperlipidemia type Continue atorvastatin - atorvastatin (LIPITOR) 10 MG tablet; Take 0.5 tablets (5 mg total) by mouth daily.  Dispense: 45 tablet; Refill: 3 - Lipid Panel    Family/ staff Communication: reviewed care plan with patient and her daughter Nena Jordan over the phone. All questions answered to best of my knowledge.   Labs/tests ordered:   Lab Orders     CMP with eGFR([facility])     Lipid Panel  Follow up in 3 months.    Blanchie Serve, MD Internal Medicine [facility]  [place], [place] [address] Cell Phone (Monday-Friday 8 am - 5 pm): [phone] On Call: [phone] and follow prompts after 5 pm and on weekends Office Phone: [phone] Office Fax: [phone]

## 2017-11-18 NOTE — Patient Instructions (Signed)
  Change your lasix to every day as needed for shortness of breath or leg edema for now.  Change your potassium supplement from twice a day to once a day only when you take your lasix.   Lab work in 2 weeks.

## 2017-11-29 ENCOUNTER — Other Ambulatory Visit: Payer: Self-pay | Admitting: Pulmonary Disease

## 2017-12-02 ENCOUNTER — Other Ambulatory Visit: Payer: Self-pay | Admitting: Adult Health

## 2017-12-02 DIAGNOSIS — N184 Chronic kidney disease, stage 4 (severe): Secondary | ICD-10-CM

## 2017-12-03 DIAGNOSIS — N184 Chronic kidney disease, stage 4 (severe): Secondary | ICD-10-CM

## 2017-12-03 LAB — COMPLETE METABOLIC PANEL WITH GFR
AG Ratio: 1.5 (calc) (ref 1.0–2.5)
ALBUMIN MSPROF: 4 g/dL (ref 3.6–5.1)
ALKALINE PHOSPHATASE (APISO): 77 U/L (ref 33–130)
ALT: 35 U/L — ABNORMAL HIGH (ref 6–29)
AST: 32 U/L (ref 10–35)
BUN / CREAT RATIO: 14 (calc) (ref 6–22)
BUN: 26 mg/dL — AB (ref 7–25)
CALCIUM: 9.3 mg/dL (ref 8.6–10.4)
CO2: 20 mmol/L (ref 20–32)
CREATININE: 1.92 mg/dL — AB (ref 0.60–0.88)
Chloride: 106 mmol/L (ref 98–110)
GFR, EST AFRICAN AMERICAN: 26 mL/min/{1.73_m2} — AB (ref >=60)
GFR, EST NON AFRICAN AMERICAN: 23 mL/min/{1.73_m2} — AB (ref >=60)
GLOBULIN: 2.6 g/dL (ref 1.9–3.7)
GLUCOSE: 102 mg/dL — AB (ref 65–99)
Potassium: 3.9 mmol/L (ref 3.5–5.3)
SODIUM: 140 mmol/L (ref 135–146)
TOTAL PROTEIN: 6.6 g/dL (ref 6.1–8.1)
Total Bilirubin: 0.7 mg/dL (ref 0.2–1.2)

## 2017-12-03 LAB — LIPID PANEL
CHOL/HDL RATIO: 3.1 (calc) (ref <5.0)
CHOLESTEROL: 166 mg/dL (ref <200)
HDL: 53 mg/dL (ref >50)
LDL CHOLESTEROL (CALC): 87 mg/dL
NON-HDL CHOLESTEROL (CALC): 113 mg/dL (ref <130)
Triglycerides: 158 mg/dL — ABNORMAL HIGH (ref <150)

## 2017-12-09 ENCOUNTER — Encounter: Payer: Self-pay | Admitting: Internal Medicine

## 2017-12-09 ENCOUNTER — Non-Acute Institutional Stay: Payer: Medicare Other | Admitting: Internal Medicine

## 2017-12-09 VITALS — BP 122/78 | HR 88 | Temp 97.3°F | Resp 24 | Ht 64.0 in | Wt 107.6 lb

## 2017-12-09 DIAGNOSIS — I5032 Chronic diastolic (congestive) heart failure: Secondary | ICD-10-CM

## 2017-12-09 DIAGNOSIS — I779 Disorder of arteries and arterioles, unspecified: Secondary | ICD-10-CM

## 2017-12-09 DIAGNOSIS — I5033 Acute on chronic diastolic (congestive) heart failure: Secondary | ICD-10-CM

## 2017-12-09 DIAGNOSIS — J9621 Acute and chronic respiratory failure with hypoxia: Secondary | ICD-10-CM | POA: Diagnosis not present

## 2017-12-09 DIAGNOSIS — J441 Chronic obstructive pulmonary disease with (acute) exacerbation: Secondary | ICD-10-CM

## 2017-12-09 DIAGNOSIS — N184 Chronic kidney disease, stage 4 (severe): Secondary | ICD-10-CM | POA: Diagnosis not present

## 2017-12-09 DIAGNOSIS — R5381 Other malaise: Secondary | ICD-10-CM

## 2017-12-09 DIAGNOSIS — I480 Paroxysmal atrial fibrillation: Secondary | ICD-10-CM

## 2017-12-09 MED ORDER — PREDNISONE 50 MG PO TABS: 50.0000 mg | ORAL_TABLET | Freq: Every day | ORAL | 0 refills | Status: DC

## 2017-12-09 MED ORDER — FUROSEMIDE 20 MG PO TABS: 20.0000 mg | ORAL_TABLET | ORAL | 3 refills | Status: DC

## 2017-12-09 MED ORDER — IPRATROPIUM-ALBUTEROL 0.5-2.5 (3) MG/3ML IN SOLN: 3.0000 mL | Freq: Four times a day (QID) | RESPIRATORY_TRACT | 3 refills | Status: DC

## 2017-12-09 NOTE — Patient Instructions (Addendum)
I would like for you to be admitted to SNF due to increased need for assistance with your activities of daily living with worsening COPD and CHF. You will start taking lasix and potassium pill every other day for now. I will place you on 5 days course of prednisone as well. I want you to take your nebulizer four times a day while awake at least 4 hours apart. Continue to use your oxygen

## 2017-12-09 NOTE — Addendum Note (Signed)
Addended by: Josph Macho A on: 12/09/2017 11:45 AM   Modules accepted: Orders

## 2017-12-09 NOTE — Progress Notes (Signed)
[facility]  Provider: Oneal Grout MD   Location:  [facility]   Place of Service:  Clinic (12)  PCP: Caesar Bookman, NP Patient Care Team: Ngetich, Donalee Citrin, NP as PCP - General (Family Medicine) [place], Peter M, MD as PCP - Cardiology (Cardiology)  Extended Emergency Contact Information Primary Emergency Contact: Norm Parcel [place] Phone: [phone] Relation: Daughter Secondary Emergency Contact: Bernette Redbird [place] of [place] Mobile Phone: [phone] Relation: Daughter  Code Status: DNR  Goals of Care: Advanced Directive information Advanced Directives 12/09/2017  Does Patient Have a Medical Advance Directive? Yes  Type of Estate agent of [place];Out of facility DNR (pink MOST or yellow form);Living will  Does patient want to make changes to medical advance directive? -  Copy of Healthcare Power of Attorney in Chart? Yes  Would patient like information on creating a medical advance directive? -  Pre-existing out of facility DNR order (yellow form or pink MOST form) Yellow form placed in chart (order not valid for inpatient use);Pink MOST form placed in chart (order not valid for inpatient use)      Chief Complaint  Patient presents with  . Acute Visit    COPD--> low O2 sats, shortness of breath, both feet are swollen, having to use wheelchair to get around her apt    HPI: Patient is a 84 y.o. female seen today for acute visit. She is here with her daughter. Her breathing has worsened over last 1 week with this limiting her mobility per daughter. She is currently on 4 l o2 by nasal canula with her COPD with chronic respiratory failure. She has required to use her wheelchair for ambulation due to weakness. Daughter noticed a drop in her o2 sat to low 80s yesterday night during exertion and after resting for some time it came up to 90s. She was seen in clinic 2 weeks back and with  worsening renal function and stable swelling, her lasix was changed to every day as needed only from daily regimen. Today she reports increased swelling to her feet. She is not able to tell me how frequently she has taken her lasix. Per daughter she took a dose yesterday.   Past Medical History:  Diagnosis Date  . Anticoagulated    Eliquis  . Atrial flutter (HCC)    NO CARDIOLOGIST FOLLOWED BY DR Tori Milks  . Bloody stools    LAST 4 DAYS  . CHF (congestive heart failure) (HCC)   . Chronic back pain   . CKD (chronic kidney disease), stage III (HCC)    Creatinine 1.35 -1.55  . COPD (chronic obstructive pulmonary disease) (HCC)   . Diverticulosis   . Dyspnea   . Dysrhythmia   . Edema   . Gait disorder   . Glaucoma    Severe  . Glaucoma    both eyes  . High cholesterol   . Multinodular goiter   . PAOD (peripheral arterial occlusive disease) (HCC)    Stein in right groin, decrease pulses  . PMR (polymyalgia rheumatica) (HCC)   . Pneumonia, organism unspecified(486) 04/18/2016  . Protein-calorie malnutrition (HCC)   . Rash of back   . Weakness    Past Surgical History:  Procedure Laterality Date  . COLONOSCOPY N/A 07/15/2013   Procedure: COLONOSCOPY;  Surgeon: Louis Meckel, MD;  Location: [facility]COPY;  Service: Endoscopy;  Laterality: N/A;  . ESOPHAGOGASTRODUODENOSCOPY N/A 07/08/2013   Procedure: ESOPHAGOGASTRODUODENOSCOPY (EGD);  Surgeon: Louis Meckel, MD;  Location: [facility];  Service: Endoscopy;  Laterality: N/A;  Per Connye Burkitt, will try to have CRNA available, but will not guaranteee. Ok to schedule per Clydie Braun 07/06/13  . GALLBLADDER SURGERY    . IR GENERIC HISTORICAL  05/18/2014   IR RADIOLOGIST EVAL & MGMT 05/18/2014 Berdine Dance, MD [facility] INTERV RAD  . LAMINECTOMY    . leg stent Right   . SHOULDER SURGERY     frozen shoulder/ Bil shoulders    reports that she quit smoking about 24 years ago. Her smoking use included cigarettes. She has a 15.00 pack-year  smoking history. She has never used smokeless tobacco. She reports that she does not drink alcohol or use drugs. Social History   Socioeconomic History  . Marital status: Married    Spouse name: Not on file  . Number of children: 4  . Years of education: Not on file  . Highest education level: Not on file  Occupational History  . Occupation: Retired  Engineer, production  . Financial resource strain: Not on file  . Food insecurity:    Worry: Not on file    Inability: Not on file  . Transportation needs:    Medical: Not on file    Non-medical: Not on file  Tobacco Use  . Smoking status: Former Smoker    Packs/day: 1.00    Years: 15.00    Pack years: 15.00    Types: Cigarettes    Last attempt to quit: 07/12/1993    Years since quitting: 24.4  . Smokeless tobacco: Never Used  Substance and Sexual Activity  . Alcohol use: No  . Drug use: No  . Sexual activity: Not on file  Lifestyle  . Physical activity:    Days per week: Not on file    Minutes per session: Not on file  . Stress: Not on file  Relationships  . Social connections:    Talks on phone: Not on file    Gets together: Not on file    Attends religious service: Not on file    Active member of club or organization: Not on file    Attends meetings of clubs or organizations: Not on file    Relationship status: Not on file  . Intimate partner violence:    Fear of current or ex partner: Not on file    Emotionally abused: Not on file    Physically abused: Not on file    Forced sexual activity: Not on file  Other Topics Concern  . Not on file  Social History Narrative   Married -Dr. Aura Fey   Former smoker - stopped 1995   Alcohol none   POA, Living Will     Family History  Problem Relation Age of Onset  . Throat cancer Mother   . Diabetes Maternal Uncle   . Throat cancer Maternal Grandfather   . Cirrhosis Maternal Aunt     Health Maintenance  Topic Date Due  . TETANUS/TDAP  05/07/1948  . PNA vac Low  Risk Adult (1 of 2 - PCV13) 04/08/1995  . INFLUENZA VACCINE  11/05/2017  . DEXA SCAN  Completed    Allergies  Allergen Reactions  . Celebrex [Celecoxib]   . Codeine Sulfate Nausea And Vomiting and Other (See Comments)    headache  . Evista [Raloxifene Hcl]   . Fosamax [Alendronate Sodium]     Outpatient Encounter Medications as of 12/09/2017  Medication Sig  . aspirin 81 MG  chewable tablet Chew 81 mg by mouth daily.  Marland Kitchen atorvastatin (LIPITOR) 10 MG tablet Take 0.5 tablets (5 mg total) by mouth daily.  . bimatoprost (LUMIGAN) 0.01 % SOLN Place 1 drop into both eyes at bedtime.  . cholecalciferol (VITAMIN D) 1000 UNITS tablet Take 1,000 Units by mouth at bedtime.   Marland Kitchen diltiazem (CARDIZEM CD) 360 MG 24 hr capsule Take 1 capsule (360 mg total) by mouth daily.  Marland Kitchen ELIQUIS 2.5 MG TABS tablet Take 2.5 mg by mouth 2 (two) times daily.  . furosemide (LASIX) 20 MG tablet Take 1 tablet (20 mg total) by mouth every other day.  . Nutritional Supplements (BOOST PLUS PO) Take 1 Can by mouth 2 (two) times daily between meals.  . OXYGEN Inhale 4 L into the lungs daily.   . potassium chloride (K-DUR,KLOR-CON) 10 MEQ tablet Take 10 mEq by mouth every other day. Take potassium if you are taking lasix  . tetrahydrozoline 0.05 % ophthalmic solution Place 1 drop into both eyes as needed.   . traMADol-acetaminophen (ULTRACET) 37.5-325 MG tablet Take 1 tablet by mouth 2 (two) times daily.  Marland Kitchen umeclidinium-vilanterol (ANORO ELLIPTA) 62.5-25 MCG/INH AEPB Inhale 1 puff into the lungs daily.  . VENTOLIN HFA 108 (90 Base) MCG/ACT inhaler INHALE 2 PUFFS INTO LUNGS EVERY 6 HOURS AS NEEDED FOR WHEEZING OR SHORTNESS OF BREATH  . vitamin E (VITAMIN E) 400 UNIT capsule Take 400 Units by mouth daily.  . [DISCONTINUED] albuterol (PROVENTIL) (2.5 MG/3ML) 0.083% nebulizer solution Take 3 mLs (2.5 mg total) by nebulization every 6 (six) hours as needed for wheezing or shortness of breath. DX: J44.9  . [DISCONTINUED] furosemide  (LASIX) 20 MG tablet Take 1 tablet (20 mg total) by mouth daily as needed.  Marland Kitchen ipratropium-albuterol (DUONEB) 0.5-2.5 (3) MG/3ML SOLN Take 3 mLs by nebulization 4 (four) times daily.  . predniSONE (DELTASONE) 50 MG tablet Take 1 tablet (50 mg total) by mouth daily with breakfast.   No facility-administered encounter medications on file as of 12/09/2017.     Review of Systems  Constitutional: Positive for appetite change and fatigue. Negative for chills and fever.  HENT: Positive for hearing loss. Negative for congestion, rhinorrhea, sinus pressure and sinus pain.   Respiratory: Positive for shortness of breath. Negative for cough and choking.   Cardiovascular: Positive for leg swelling. Negative for chest pain and palpitations.  Gastrointestinal: Negative for abdominal pain, constipation, diarrhea, nausea and vomiting.       Had a bowel movement this am. Continent with bowel and bladder  Genitourinary: Negative for dysuria and hematuria.  Musculoskeletal: Positive for gait problem.  Neurological: Positive for weakness. Negative for dizziness, light-headedness and headaches.  Psychiatric/Behavioral: Positive for confusion.    Vitals:   12/09/17 0919  BP: 122/78  Pulse: 88  Resp: (!) 24  Temp: (!) 97.3 F (36.3 C)  TempSrc: Axillary  SpO2: 91%  Weight: 107 lb 9.6 oz (48.8 kg)  Height: 5\' 4"  (1.626 m)   Body mass index is 18.47 kg/m.   Wt Readings from Last 3 Encounters:  12/09/17 107 lb 9.6 oz (48.8 kg)  11/18/17 102 lb 9.6 oz (46.5 kg)  10/22/17 106 lb (48.1 kg)   Physical Exam  Constitutional: She is oriented to person, place, and time. No distress.  Has gained 5 lbs since last visit  HENT:  Head: Normocephalic and atraumatic.  Mouth/Throat: Oropharynx is clear and moist. No oropharyngeal exudate.  Eyes: Pupils are equal, round, and reactive to light. Conjunctivae and EOM  are normal. Right eye exhibits no discharge. Left eye exhibits no discharge.  Neck: Normal range of  motion. Neck supple.  Cardiovascular:  Irregular heart rate  Pulmonary/Chest: She has no wheezes. She has rales. She exhibits no tenderness.  Increased respiratory effort, on 4 liter oxygen by nasal canula, using her accessory muscles, poor air movement, rales +  Abdominal: Soft. Bowel sounds are normal. There is no tenderness. There is no guarding.  Musculoskeletal: Normal range of motion. She exhibits edema.  1+ pitting edema to feet, trace leg edema, generalized weakness  Lymphadenopathy:    She has no cervical adenopathy.  Neurological: She is alert and oriented to person, place, and time.  Some confusion about medication administration and self care noted  Skin: She is not diaphoretic.  Psychiatric: She has a normal mood and affect.    Labs reviewed: Basic Metabolic Panel: Recent Labs    01/13/17 0534  10/14/17 0000 10/22/17 1148 12/03/17 0810  NA 137   < > 141 142 140  K 5.3*   < > 4.0 4.6 3.9  CL 103   < > 106 102 106  CO2 23   < > 24 20 20   GLUCOSE 152*   < > 88 80 102*  BUN 36*   < > 32* 28* 26*  CREATININE 1.87*   < > 2.05* 1.94* 1.92*  CALCIUM 9.0   < > 9.4 9.6 9.3  MG 2.3  --   --   --   --    < > = values in this interval not displayed.   Liver Function Tests: Recent Labs    08/24/17 1211 09/03/17 10/14/17 0000 12/03/17 0810  AST 25 19 19  32  ALT 25 53 13 35*  ALKPHOS 80 58  --   --   BILITOT 0.8 0.6 0.9 0.7  PROT 8.0  --  6.4 6.6  ALBUMIN 3.4* 3.3  --   --    No results for input(s): LIPASE, AMYLASE in the last 8760 hours. No results for input(s): AMMONIA in the last 8760 hours. CBC: Recent Labs    08/24/17 1211  08/27/17 0524  09/10/17 10/14/17 0000 10/22/17 1148  WBC 10.7*   < > 20.0*   < > 9.2 9.6 7.7  NEUTROABS 8.6*  --  18.9*  --   --  6,614  --   HGB 15.2*   < > 11.3*   < > 11.7 14.1 13.5  HCT 45.0   < > 34.5*   < > 35.4 41.6 42.3  MCV 91.3   < > 90.6   < > 90.5 90.2 94  PLT 417*   < > 448*  --   --  323 307   < > = values in this  interval not displayed.   Cardiac Enzymes: Recent Labs    01/12/17 1507  TROPONINI <0.03   BNP: Invalid input(s): POCBNP No results found for: HGBA1C Lab Results  Component Value Date   TSH 3.58 05/15/2016   No results found for: VITAMINB12 No results found for: FOLATE No results found for: IRON, TIBC, FERRITIN  Lipid Panel: Recent Labs    12/03/17 0810  CHOL 166  HDL 53  LDLCALC 87  TRIG 158*  CHOLHDL 3.1   No results found for: HGBA1C  Procedures since last visit: No results found.  Assessment/Plan  1. COPD with acute exacerbation (HCC) Start prednisone 50 mg daily for 5 days. Change duoneb to qid while awake. Continue oxygen. Continue her  bronchodilators. If no improvement, reassess and consider chest xray and antibiotic.  - ipratropium-albuterol (DUONEB) 0.5-2.5 (3) MG/3ML SOLN; Take 3 mLs by nebulization 4 (four) times daily.  Dispense: 360 mL; Refill: 3 - predniSONE (DELTASONE) 50 MG tablet; Take 1 tablet (50 mg total) by mouth daily with breakfast.  Dispense: 5 tablet; Refill: 0  2. Acute on chronic respiratory failure with hypoxia (HCC) With copd exacerbation and possible mild CHF exacerbation. Continue oxygen, schedule duoneb, start prednisone as above, change lasix and kcl dosing and monitor vital signs, admit to SNF for close monitoring and to assist with her ADLs  3. CKD (chronic kidney disease) stage 4, GFR 15-29 ml/min (HCC) To monitor bmp. - furosemide (LASIX) 20 MG tablet; Take 1 tablet (20 mg total) by mouth every other day.  Dispense: 90 tablet; Refill: 3  4. Acute on Chronic diastolic congestive heart failure (HCC) With increased edema to feet and 5 lb weight gain and worsening dyspnea. Change lasix from daily as needed to 20 mg every other day and kcl to 10 meq every other day for now. Monitor daily weight. Obtain labs once admitted to health care.  - furosemide (LASIX) 20 MG tablet; Take 1 tablet (20 mg total) by mouth every other day.  Dispense:  90 tablet; Refill: 3  5. PAF (paroxysmal atrial fibrillation) (HCC) Continue diltiazem and eliquis. monitor  6. Physical deconditioning Recommend admitting to SNF, daughter and pt agrees, FL2 form filled out and Child psychotherapist contacted to obtain PASSAR for her admission. Will have her be evaluated by PT, OT and SLP team at SNF. Fall precautions. Needs increased level of assistance with her ADLs that pt wont be able to perform by herself in her apartment.     Labs/tests ordered:  Will obtain cbc with diff, cmp once admitted to SNF  Next appointment: admit to SNF  Communication: reviewed care plan with patient and her daughter.   I spent 60 minutes in total face-to-face time with the patient, more than 50% of which was spent in counseling and coordination of care, filling out FL2, reviewing test results, reviewing medication and discussing or reviewing the diagnosis and treatment plan with patient.     Oneal Grout, MD Internal Medicine [facility] [address] [place], [place] [address] Cell Phone (Monday-Friday 8 am - 5 pm): [phone] On Call: [phone] and follow prompts after 5 pm and on weekends Office Phone: [phone] Office Fax: [phone]

## 2017-12-10 ENCOUNTER — Encounter: Payer: Self-pay | Admitting: Internal Medicine

## 2017-12-10 ENCOUNTER — Non-Acute Institutional Stay (SKILLED_NURSING_FACILITY): Payer: Medicare Other | Admitting: Internal Medicine

## 2017-12-10 DIAGNOSIS — J441 Chronic obstructive pulmonary disease with (acute) exacerbation: Secondary | ICD-10-CM

## 2017-12-10 DIAGNOSIS — E46 Unspecified protein-calorie malnutrition: Secondary | ICD-10-CM

## 2017-12-10 DIAGNOSIS — I480 Paroxysmal atrial fibrillation: Secondary | ICD-10-CM

## 2017-12-10 DIAGNOSIS — R531 Weakness: Secondary | ICD-10-CM | POA: Diagnosis not present

## 2017-12-10 DIAGNOSIS — I5033 Acute on chronic diastolic (congestive) heart failure: Secondary | ICD-10-CM | POA: Diagnosis not present

## 2017-12-10 DIAGNOSIS — N184 Chronic kidney disease, stage 4 (severe): Secondary | ICD-10-CM

## 2017-12-10 DIAGNOSIS — I739 Peripheral vascular disease, unspecified: Secondary | ICD-10-CM

## 2017-12-10 DIAGNOSIS — J9621 Acute and chronic respiratory failure with hypoxia: Secondary | ICD-10-CM | POA: Diagnosis not present

## 2017-12-10 DIAGNOSIS — I10 Essential (primary) hypertension: Secondary | ICD-10-CM

## 2017-12-10 DIAGNOSIS — Z7901 Long term (current) use of anticoagulants: Secondary | ICD-10-CM

## 2017-12-10 NOTE — Progress Notes (Signed)
Provider:  Oneal Grout MD  Location:  [facility] Room Number: 5 Place of Service:  SNF (31)  PCP: Ngetich, Donalee Citrin, NP Patient Care Team: Ngetich, Donalee Citrin, NP as PCP - General (Family Medicine) [place], Peter M, MD as PCP - Cardiology (Cardiology)  Extended Emergency Contact Information Primary Emergency Contact: Norm Parcel [place] Phone: [phone] Relation: Daughter Secondary Emergency Contact: Bernette Redbird [place] Mobile Phone: [phone] Relation: Daughter  Code Status: DNR  Goals of Care: Advanced Directive information Advanced Directives 12/10/2017  Does Patient Have a Medical Advance Directive? Yes  Type of Estate agent of [place];Out of facility DNR (pink MOST or yellow form);Living will  Does patient want to make changes to medical advance directive? -  Copy of Healthcare Power of Attorney in Chart? Yes  Would patient like information on creating a medical advance directive? -  Pre-existing out of facility DNR order (yellow form or pink MOST form) Yellow form placed in chart (order not valid for inpatient use);Pink MOST form placed in chart (order not valid for inpatient use)      Chief Complaint  Patient presents with  . New Admit To SNF    HPI: Patient is a 84 y.o. female seen today for admission visit. She was admitted to SNF form clinic yesterday for acute COPD exacerbation and mild CHF exacerbation with acute on chronic respiratory failure. She was started on scheduled duoneb, daily prednisone and lasix on alternate days yesterday. She is seen in her room with her charge nurse present today. She clinically appears slightly better than yesterday with her breathing more comfortably. Of note, she was in the hospital from 08/24/17-08/29/17 with acute COPD exacerbation and she has medical history of COPD, CHF, afib, CAD and mild dementia among others.   Past Medical  History:  Diagnosis Date  . Anticoagulated    Eliquis  . Atrial flutter (HCC)    NO CARDIOLOGIST FOLLOWED BY DR Tori Milks  . Bloody stools    LAST 4 DAYS  . CHF (congestive heart failure) (HCC)   . Chronic back pain   . CKD (chronic kidney disease), stage III (HCC)    Creatinine 1.35 -1.55  . COPD (chronic obstructive pulmonary disease) (HCC)   . Diverticulosis   . Dyspnea   . Dysrhythmia   . Edema   . Gait disorder   . Glaucoma    Severe  . Glaucoma    both eyes  . High cholesterol   . Multinodular goiter   . PAOD (peripheral arterial occlusive disease) (HCC)    Stein in right groin, decrease pulses  . PMR (polymyalgia rheumatica) (HCC)   . Pneumonia, organism unspecified(486) 04/18/2016  . Protein-calorie malnutrition (HCC)   . Rash of back   . Weakness    Past Surgical History:  Procedure Laterality Date  . COLONOSCOPY N/A 07/15/2013   Procedure: COLONOSCOPY;  Surgeon: Louis Meckel, MD;  Location: [facility] ENDOSCOPY;  Service: Endoscopy;  Laterality: N/A;  . ESOPHAGOGASTRODUODENOSCOPY N/A 07/08/2013   Procedure: ESOPHAGOGASTRODUODENOSCOPY (EGD);  Surgeon: Louis Meckel, MD;  Location: [facility];  Service: Endoscopy;  Laterality: N/A;  Per Connye Burkitt, will try to have CRNA available, but will not guaranteee. Ok to schedule per Clydie Braun 07/06/13  . GALLBLADDER SURGERY    . IR GENERIC HISTORICAL  05/18/2014   IR RADIOLOGIST EVAL & MGMT 05/18/2014 Berdine Dance, MD [facility] INTERV RAD  . LAMINECTOMY    . leg  stent Right   . SHOULDER SURGERY     frozen shoulder/ Bil shoulders    reports that she quit smoking about 24 years ago. Her smoking use included cigarettes. She has a 15.00 pack-year smoking history. She has never used smokeless tobacco. She reports that she does not drink alcohol or use drugs. Social History   Socioeconomic History  . Marital status: Married    Spouse name: Not on file  . Number of children: 4  . Years of education: Not on file  . Highest  education level: Not on file  Occupational History  . Occupation: Retired  Engineer, production  . Financial resource strain: Not on file  . Food insecurity:    Worry: Not on file    Inability: Not on file  . Transportation needs:    Medical: Not on file    Non-medical: Not on file  Tobacco Use  . Smoking status: Former Smoker    Packs/day: 1.00    Years: 15.00    Pack years: 15.00    Types: Cigarettes    Last attempt to quit: 07/12/1993    Years since quitting: 24.4  . Smokeless tobacco: Never Used  Substance and Sexual Activity  . Alcohol use: No  . Drug use: No  . Sexual activity: Not on file  Lifestyle  . Physical activity:    Days per week: Not on file    Minutes per session: Not on file  . Stress: Not on file  Relationships  . Social connections:    Talks on phone: Not on file    Gets together: Not on file    Attends religious service: Not on file    Active member of club or organization: Not on file    Attends meetings of clubs or organizations: Not on file    Relationship status: Not on file  . Intimate partner violence:    Fear of current or ex partner: Not on file    Emotionally abused: Not on file    Physically abused: Not on file    Forced sexual activity: Not on file  Other Topics Concern  . Not on file  Social History Narrative   Married -Dr. Aura Fey   Former smoker - stopped 1995   Alcohol none   POA, Living Will    Functional Status Survey:    Family History  Problem Relation Age of Onset  . Throat cancer Mother   . Diabetes Maternal Uncle   . Throat cancer Maternal Grandfather   . Cirrhosis Maternal Aunt     Health Maintenance  Topic Date Due  . TETANUS/TDAP  05/07/1946  . PNA vac Low Risk Adult (1 of 2 - PCV13) 04/08/1995  . INFLUENZA VACCINE  11/05/2017  . DEXA SCAN  Completed    Allergies  Allergen Reactions  . Celebrex [Celecoxib]   . Codeine Sulfate Nausea And Vomiting and Other (See Comments)    headache  . Evista  [Raloxifene Hcl]   . Fosamax [Alendronate Sodium]     Outpatient Encounter Medications as of 12/10/2017  Medication Sig  . aspirin 81 MG chewable tablet Chew 81 mg by mouth daily.  Marland Kitchen atorvastatin (LIPITOR) 10 MG tablet Take 0.5 tablets (5 mg total) by mouth daily.  . bimatoprost (LUMIGAN) 0.01 % SOLN Place 1 drop into both eyes at bedtime.  . cholecalciferol (VITAMIN D) 1000 UNITS tablet Take 1,000 Units by mouth at bedtime.   Marland Kitchen diltiazem (CARDIZEM CD) 360 MG 24 hr capsule  Take 1 capsule (360 mg total) by mouth daily.  Marland Kitchen ELIQUIS 2.5 MG TABS tablet Take 2.5 mg by mouth 2 (two) times daily.  . furosemide (LASIX) 20 MG tablet Take 1 tablet (20 mg total) by mouth every other day.  . ipratropium-albuterol (DUONEB) 0.5-2.5 (3) MG/3ML SOLN Take 3 mLs by nebulization 4 (four) times daily.  . Nutritional Supplements (BOOST PLUS PO) Take 1 Can by mouth 2 (two) times daily between meals.  . OXYGEN Inhale 4 L into the lungs daily.   . potassium chloride (K-DUR,KLOR-CON) 10 MEQ tablet Take 10 mEq by mouth every other day. Take potassium if you are taking lasix  . predniSONE (DELTASONE) 50 MG tablet Take 1 tablet (50 mg total) by mouth daily with breakfast.  . tetrahydrozoline 0.05 % ophthalmic solution Place 1 drop into both eyes as needed.   . traMADol-acetaminophen (ULTRACET) 37.5-325 MG tablet Take 1 tablet by mouth 2 (two) times daily as needed.  . umeclidinium-vilanterol (ANORO ELLIPTA) 62.5-25 MCG/INH AEPB Inhale 1 puff into the lungs daily.  . VENTOLIN HFA 108 (90 Base) MCG/ACT inhaler INHALE 2 PUFFS INTO LUNGS EVERY 6 HOURS AS NEEDED FOR WHEEZING OR SHORTNESS OF BREATH  . vitamin E (VITAMIN E) 400 UNIT capsule Take 400 Units by mouth daily.   No facility-administered encounter medications on file as of 12/10/2017.     Review of Systems  Constitutional: Positive for appetite change and fatigue. Negative for chills and fever.  HENT: Positive for hearing loss. Negative for congestion, mouth sores,  rhinorrhea and trouble swallowing.   Eyes: Positive for visual disturbance.  Respiratory: Positive for shortness of breath. Negative for cough.   Cardiovascular: Positive for leg swelling. Negative for chest pain and palpitations.  Gastrointestinal: Negative for abdominal pain, diarrhea, nausea and vomiting.  Genitourinary: Negative for dysuria.  Musculoskeletal: Positive for gait problem.  Skin: Negative for wound.  Neurological: Negative for dizziness, numbness and headaches.  Hematological: Bruises/bleeds easily.  Psychiatric/Behavioral: Positive for confusion. Negative for behavioral problems.    Vitals:   12/10/17 1105  BP: 114/64  Pulse: 82  Resp: 20  Temp: 98.7 F (37.1 C)  SpO2: 96%  Weight: 108 lb (49 kg)  Height: 5\' 4"  (1.626 m)   Body mass index is 18.54 kg/m. Physical Exam  Constitutional: She is oriented to person, place, and time. No distress.  Thin built, frail, elderly pleasant female  HENT:  Head: Normocephalic and atraumatic.  Nose: Nose normal.  Mouth/Throat: Oropharynx is clear and moist. No oropharyngeal exudate.  Eyes: Pupils are equal, round, and reactive to light. Conjunctivae and EOM are normal.  Neck: Normal range of motion. Neck supple.  Cardiovascular:  Irregular HR  Pulmonary/Chest: Effort normal. No respiratory distress. She has wheezes. She has no rales.  Poor air movement on 4 liters o2 by [place]  Abdominal: Soft. Bowel sounds are normal. There is no tenderness. There is no guarding.  Musculoskeletal: Normal range of motion. She exhibits edema.  Generalized weakness, unsteady gait, pitting edema to feet  Lymphadenopathy:    She has no cervical adenopathy.  Neurological: She is alert and oriented to person, place, and time. She exhibits normal muscle tone.  Skin: Skin is warm and dry. She is not diaphoretic.  Psychiatric: She has a normal mood and affect.    Labs reviewed: Basic Metabolic Panel: Recent Labs    01/13/17 0534   10/14/17 0000 10/22/17 1148 12/03/17 0810  NA 137   < > 141 142 140  K 5.3*   < >  4.0 4.6 3.9  CL 103   < > 106 102 106  CO2 23   < > 24 20 20   GLUCOSE 152*   < > 88 80 102*  BUN 36*   < > 32* 28* 26*  CREATININE 1.87*   < > 2.05* 1.94* 1.92*  CALCIUM 9.0   < > 9.4 9.6 9.3  MG 2.3  --   --   --   --    < > = values in this interval not displayed.   Liver Function Tests: Recent Labs    08/24/17 1211 09/03/17 10/14/17 0000 12/03/17 0810  AST 25 19 19  32  ALT 25 53 13 35*  ALKPHOS 80 58  --   --   BILITOT 0.8 0.6 0.9 0.7  PROT 8.0  --  6.4 6.6  ALBUMIN 3.4* 3.3  --   --    No results for input(s): LIPASE, AMYLASE in the last 8760 hours. No results for input(s): AMMONIA in the last 8760 hours. CBC: Recent Labs    08/24/17 1211  08/27/17 0524  09/10/17 10/14/17 0000 10/22/17 1148  WBC 10.7*   < > 20.0*   < > 9.2 9.6 7.7  NEUTROABS 8.6*  --  18.9*  --   --  6,614  --   HGB 15.2*   < > 11.3*   < > 11.7 14.1 13.5  HCT 45.0   < > 34.5*   < > 35.4 41.6 42.3  MCV 91.3   < > 90.6   < > 90.5 90.2 94  PLT 417*   < > 448*  --   --  323 307   < > = values in this interval not displayed.   Cardiac Enzymes: Recent Labs    01/12/17 1507  TROPONINI <0.03   BNP: Invalid input(s): POCBNP No results found for: HGBA1C Lab Results  Component Value Date   TSH 3.58 05/15/2016   No results found for: VITAMINB12 No results found for: FOLATE No results found for: IRON, TIBC, FERRITIN  Imaging and Procedures obtained prior to SNF admission: Dg Chest 2 View  Result Date: 08/24/2017 CLINICAL DATA:  Dyspnea EXAM: CHEST - 2 VIEW COMPARISON:  01/12/2017 chest radiograph. FINDINGS: Stable cardiomediastinal silhouette with normal heart size. No pneumothorax. No pleural effusion. Mildly hyperinflated lungs. Emphysema. No pulmonary edema. Mild chronic bibasilar scarring. No acute consolidative airspace disease. IMPRESSION: No acute cardiopulmonary disease. Mildly hyperinflated lungs and  emphysema, suggesting COPD. Mild chronic bibasilar scarring. Electronically Signed   By: Delbert Phenix M.D.   On: 08/24/2017 13:28    Assessment/Plan  1. Generalized weakness Will have her work with physical therapy and occupational therapy team to help with gait training and muscle strengthening exercises.fall precautions. Skin care. Encourage to be out of bed.   2. Acute on chronic respiratory failure with hypoxia (HCC) With COPD and CHF flare up. Continue oxygen continuously at 4 l/min, continue her bronchodilator and schedule duoneb for three times a day when awake, rescue inhaler as needed at bedside. prednsione course of 5 days. Lasix 20 mg qod with KCl  3. Acute exacerbation of chronic obstructive pulmonary disease (COPD) (HCC) Some clinical improvement from yesterday. Continue and complete 5 days course of prednisone. Continue bronchodilators and oxygen. Monitor clinically.   4. Acute on chronic diastolic CHF (congestive heart failure) (HCC) Continue lasix 20 mg qod with kcl 10 meq qod. Check bmp. Keep legs elevated at rest. Add ted hose. Check weight 3 days a week.  5. PAF (paroxysmal atrial fibrillation) (HCC) Controlled HR. Continue eliquis for stroke prevention and diltiazem 360 mg daily for rate control.   6. PVD (peripheral vascular disease) (HCC) Monitor skin for wound and ulcer. Skin care.   7. Essential hypertension Low BP this am, current BP normal. Will recommend manual BP check for now. Change timing of diltiazem to evening and lasix at 8 am. Continue statin and aspirin.   8. CKD (chronic kidney disease) stage 4, GFR 15-29 ml/min (HCC) Check BMP, maintain hydration  9. Chronic anticoagulation Continue xarelto and monitor  10. Protein-calorie malnutrition, unspecified severity (HCC) Monitor and encourage po intake.    Family/ staff Communication: reviewed care plan with patient and charge nurse. Have recommended patient to consider transfer to ALF from here on  recovery due to need for assistance with some of her ADLs.    Labs/tests ordered: cbc, cmp 12/14/17  Oneal Grout, MD Internal Medicine [facility] [address] [place], [place] [address] Cell Phone (Monday-Friday 8 am - 5 pm): [phone] On Call: [phone] and follow prompts after 5 pm and on weekends Office Phone: [phone] Office Fax: [phone]

## 2017-12-14 LAB — COMPLETE METABOLIC PANEL WITH GFR
ALT: 58
AST: 53
Albumin: 3.9
Alkaline Phosphatase: 69
BUN: 37 — AB (ref 4–21)
CREATININE: 2.11
Calcium: 9.2
GFR CALC NON AF AMER: 20
GLUCOSE: 83
POTASSIUM: 5.1
SODIUM: 139
Total Bilirubin: 0.6
Total Protein: 5.7 g/dL

## 2017-12-14 LAB — CBC
HCT: 39.2
HGB: 12.9
MCV: 91.6
PLATELET COUNT: 274
WBC: 13.1

## 2017-12-15 ENCOUNTER — Encounter: Payer: Self-pay | Admitting: Internal Medicine

## 2017-12-15 ENCOUNTER — Encounter: Payer: Self-pay | Admitting: *Deleted

## 2017-12-15 ENCOUNTER — Non-Acute Institutional Stay (SKILLED_NURSING_FACILITY): Payer: Medicare Other | Admitting: Family

## 2017-12-15 ENCOUNTER — Encounter: Payer: Self-pay | Admitting: Family

## 2017-12-15 DIAGNOSIS — E785 Hyperlipidemia, unspecified: Secondary | ICD-10-CM

## 2017-12-15 DIAGNOSIS — J9611 Chronic respiratory failure with hypoxia: Secondary | ICD-10-CM

## 2017-12-15 DIAGNOSIS — R748 Abnormal levels of other serum enzymes: Secondary | ICD-10-CM

## 2017-12-15 DIAGNOSIS — D72829 Elevated white blood cell count, unspecified: Secondary | ICD-10-CM

## 2017-12-15 NOTE — Progress Notes (Signed)
Location:  [facility] Room Number: 5 Place of Service:  SNF (31) Provider: Jaycen Vercher FNP-C  Ebbie Sorenson, Donalee Citrin, NP  Patient Care Team: Dail Lerew, Donalee Citrin, NP as PCP - General (Family Medicine) [place], Peter M, MD as PCP - Cardiology (Cardiology)  Extended Emergency Contact Information Primary Emergency Contact: Norm Parcel [place] of [place] Phone: [phone] Relation: Daughter Secondary Emergency Contact: Bernette Redbird [place] of [place] Mobile Phone: [phone] Relation: Daughter  Code Status:  DNR Goals of care: Advanced Directive information Advanced Directives 12/15/2017  Does Patient Have a Medical Advance Directive? Yes  Type of Estate agent of [place];Out of facility DNR (pink MOST or yellow form);Living will  Does patient want to make changes to medical advance directive? -  Copy of Healthcare Power of Attorney in Chart? Yes  Would patient like information on creating a medical advance directive? -  Pre-existing out of facility DNR order (yellow form or pink MOST form) Yellow form placed in chart (order not valid for inpatient use);Pink MOST form placed in chart (order not valid for inpatient use)     Chief Complaint  Patient presents with  . Acute Visit    abnormal labs    HPI:  Pt is a 84 y.o. female seen today at [facility] for an acute visit for evaluation of abnormal lab results.she is seen in her room today with facility Nurse present at bedside.Nurse states patient accidentally bumped her left hand on her bedside tablet.patient states tends to bruise easily.she denies any pain on hand. Her lab results done 12/14/2017 showed elevated liver enzymes AST 53,ALT 58,WBC 13.1,absoulte Neutrophils 29562.Her oxygen saturations dropped to 84 % on oxygen via nasal cannula earlier prior to visit.Oxygen 4 liters via nasal cannula saturation now upto 93 %.she denies any worsening cough,fever,chills  or signs of urinary tract infections. She continues to work with therapy.she states breathing has improved.    Past Medical History:  Diagnosis Date  . Anticoagulated    Eliquis  . Atrial flutter (HCC)    NO CARDIOLOGIST FOLLOWED BY DR Tori Milks  . Bloody stools    LAST 4 DAYS  . CHF (congestive heart failure) (HCC)   . Chronic back pain   . CKD (chronic kidney disease), stage III (HCC)    Creatinine 1.35 -1.55  . COPD (chronic obstructive pulmonary disease) (HCC)   . Diverticulosis   . Dyspnea   . Dysrhythmia   . Edema   . Gait disorder   . Glaucoma    Severe  . Glaucoma    both eyes  . High cholesterol   . Multinodular goiter   . PAOD (peripheral arterial occlusive disease) (HCC)    Stein in right groin, decrease pulses  . PMR (polymyalgia rheumatica) (HCC)   . Pneumonia, organism unspecified(486) 04/18/2016  . Protein-calorie malnutrition (HCC)   . Rash of back   . Weakness    Past Surgical History:  Procedure Laterality Date  . COLONOSCOPY N/A 07/15/2013   Procedure: COLONOSCOPY;  Surgeon: Louis Meckel, MD;  Location: [facility];  Service: Endoscopy;  Laterality: N/A;  . ESOPHAGOGASTRODUODENOSCOPY N/A 07/08/2013   Procedure: ESOPHAGOGASTRODUODENOSCOPY (EGD);  Surgeon: Louis Meckel, MD;  Location: [facility];  Service: Endoscopy;  Laterality: N/A;  Per Connye Burkitt, will try to have CRNA available, but will not guaranteee. Ok to schedule per Clydie Braun 07/06/13  . GALLBLADDER SURGERY    . IR GENERIC HISTORICAL  05/18/2014   IR RADIOLOGIST EVAL &  MGMT 05/18/2014 Berdine Dance, MD [facility] INTERV RAD  . LAMINECTOMY    . leg stent Right   . SHOULDER SURGERY     frozen shoulder/ Bil shoulders    Allergies  Allergen Reactions  . Celebrex [Celecoxib]   . Codeine Sulfate Nausea And Vomiting and Other (See Comments)    headache  . Evista [Raloxifene Hcl]   . Fosamax [Alendronate Sodium]     Outpatient Encounter Medications as of 12/15/2017  Medication Sig  .  aspirin 81 MG chewable tablet Chew 81 mg by mouth daily.  Marland Kitchen atorvastatin (LIPITOR) 10 MG tablet Take 0.5 tablets (5 mg total) by mouth daily.  . bimatoprost (LUMIGAN) 0.01 % SOLN Place 1 drop into both eyes at bedtime.  . cholecalciferol (VITAMIN D) 1000 UNITS tablet Take 1,000 Units by mouth at bedtime.   Marland Kitchen diltiazem (CARDIZEM CD) 360 MG 24 hr capsule Take 1 capsule (360 mg total) by mouth daily.  Marland Kitchen ELIQUIS 2.5 MG TABS tablet Take 2.5 mg by mouth 2 (two) times daily.  . furosemide (LASIX) 20 MG tablet Take 1 tablet (20 mg total) by mouth every other day.  . ipratropium-albuterol (DUONEB) 0.5-2.5 (3) MG/3ML SOLN Take 3 mLs by nebulization 4 (four) times daily.  . Nutritional Supplements (BOOST PLUS PO) Take 1 Can by mouth 2 (two) times daily between meals.  . OXYGEN Inhale 4 L into the lungs daily.   . potassium chloride (K-DUR,KLOR-CON) 10 MEQ tablet Take 10 mEq by mouth every other day. Take potassium if you are taking lasix  . tetrahydrozoline 0.05 % ophthalmic solution Place 1 drop into both eyes as needed.   . traMADol-acetaminophen (ULTRACET) 37.5-325 MG tablet Take 1 tablet by mouth 2 (two) times daily as needed.  . umeclidinium-vilanterol (ANORO ELLIPTA) 62.5-25 MCG/INH AEPB Inhale 1 puff into the lungs daily.  . VENTOLIN HFA 108 (90 Base) MCG/ACT inhaler INHALE 2 PUFFS INTO LUNGS EVERY 6 HOURS AS NEEDED FOR WHEEZING OR SHORTNESS OF BREATH  . vitamin E (VITAMIN E) 400 UNIT capsule Take 400 Units by mouth daily.  . [DISCONTINUED] predniSONE (DELTASONE) 50 MG tablet Take 1 tablet (50 mg total) by mouth daily with breakfast.   No facility-administered encounter medications on file as of 12/15/2017.     Review of Systems  Constitutional: Negative for appetite change, chills, fatigue and fever.  HENT: Positive for hearing loss. Negative for congestion, rhinorrhea, sinus pressure, sinus pain, sneezing and sore throat.   Eyes: Positive for visual disturbance. Negative for pain, discharge and  redness.       Wears eye glasses   Respiratory: Positive for shortness of breath. Negative for chest tightness.        Chronic respiratory failure   Cardiovascular: Negative for chest pain, palpitations and leg swelling.  Gastrointestinal: Negative for abdominal distention, abdominal pain, constipation, diarrhea, nausea and vomiting.  Genitourinary: Negative for dysuria, flank pain, frequency and urgency.  Musculoskeletal: Positive for gait problem.  Skin: Negative for color change, pallor, rash and wound.  Neurological: Negative for dizziness, light-headedness and headaches.  Hematological: Bruises/bleeds easily.  Psychiatric/Behavioral: Negative for agitation, confusion and sleep disturbance. The patient is not nervous/anxious.     Immunization History  Administered Date(s) Administered  . Influenza Split 12/30/2016  . Influenza, High Dose Seasonal PF 01/05/2016  . Influenza-Unspecified 12/06/2013  . Pneumococcal Polysaccharide-23 04/07/1994  . Zoster 05/07/1989   Pertinent  Health Maintenance Due  Topic Date Due  . PNA vac Low Risk Adult (1 of 2 - PCV13) 04/08/1995  .  INFLUENZA VACCINE  11/05/2017  . DEXA SCAN  Completed   Fall Risk  11/18/2017  Falls in the past year? No   Functional Status Survey:    Vitals:   12/15/17 1230  BP: 118/70  Pulse: (!) 53  Resp: 20  Temp: (!) 97.5 F (36.4 C)  SpO2: 93%  Weight: 106 lb (48.1 kg)  Height: 5\' 4"  (1.626 m)   Body mass index is 18.19 kg/m. Physical Exam  Constitutional:  Thin frail elderly in no acute distress   HENT:  Head: Normocephalic.  Left Ear: External ear normal.  Mouth/Throat: Oropharynx is clear and moist. No oropharyngeal exudate.  Right ear cerumen impaction TM not visualized   Eyes: Pupils are equal, round, and reactive to light. Conjunctivae and EOM are normal. Right eye exhibits no discharge. Left eye exhibits no discharge. No scleral icterus.  Neck: Normal range of motion. No JVD present. No  thyromegaly present.  Cardiovascular: An irregular rhythm present.  Pulmonary/Chest:  Poor air entry oxygen 4 liters via nasal cannula in place.  Abdominal: Soft. Bowel sounds are normal. She exhibits no distension and no mass. There is no tenderness. There is no rebound and no guarding.  Musculoskeletal: She exhibits no tenderness.  Moves x 4 extremities.unsteady gait ambulates with walker.bilateral knee high ted hose in place.trace edema.   Lymphadenopathy:    She has no cervical adenopathy.  Neurological:  Alert and oriented to person and place  Skin: Skin is warm and dry. No rash noted. No erythema. No pallor.  Psychiatric: She has a normal mood and affect. Her speech is normal and behavior is normal. Judgment and thought content normal.  Nursing note and vitals reviewed.   Labs reviewed: Recent Labs    01/13/17 0534  10/14/17 0000 10/22/17 1148 12/03/17 0810  NA 137   < > 141 142 140  K 5.3*   < > 4.0 4.6 3.9  CL 103   < > 106 102 106  CO2 23   < > 24 20 20   GLUCOSE 152*   < > 88 80 102*  BUN 36*   < > 32* 28* 26*  CREATININE 1.87*   < > 2.05* 1.94* 1.92*  CALCIUM 9.0   < > 9.4 9.6 9.3  MG 2.3  --   --   --   --    < > = values in this interval not displayed.   Recent Labs    08/24/17 1211 09/03/17 10/14/17 0000 12/03/17 0810  AST 25 19 19  32  ALT 25 53 13 35*  ALKPHOS 80 58  --   --   BILITOT 0.8 0.6 0.9 0.7  PROT 8.0  --  6.4 6.6  ALBUMIN 3.4* 3.3  --   --    Recent Labs    08/24/17 1211  08/27/17 0524  09/10/17 10/14/17 0000 10/22/17 1148  WBC 10.7*   < > 20.0*   < > 9.2 9.6 7.7  NEUTROABS 8.6*  --  18.9*  --   --  6,614  --   HGB 15.2*   < > 11.3*   < > 11.7 14.1 13.5  HCT 45.0   < > 34.5*   < > 35.4 41.6 42.3  MCV 91.3   < > 90.6   < > 90.5 90.2 94  PLT 417*   < > 448*  --   --  323 307   < > = values in this interval not displayed.   Lab Results  Component Value Date   TSH 3.58 05/15/2016   No results found for: HGBA1C Lab Results    Component Value Date   CHOL 166 12/03/2017   HDL 53 12/03/2017   LDLCALC 87 12/03/2017   TRIG 158 (H) 12/03/2017   CHOLHDL 3.1 12/03/2017    Significant Diagnostic Results in last 30 days:  No results found.  Assessment/Plan 1. Leukocytosis, unspecified type Afebrile.WBC 13.1 (12/14/2017).Denies any signs of urinary tract infections.Bilateral lung poor air entry with hypoxia prior to visit.will obtain portable CXR 2 views rule out pneumonia.recheck CBC/diff 12/21/2017.continue to monitor temp curve.  2. Elevated liver enzymes AST 53,ALT 58 (12/14/2017) currently on Lipitor 5 mg tablet daily.Latest LDL 87 will discontinue Lipitor 5 mg tablet.Recheck lipid panel in 3 months.   3. Chronic respiratory failure with hypoxia (HCC) Oxygen saturation 84 % prior to visit but now up to 93 % continue present plan and medications.Portable CXR 2 views as above.   4. Hyperlipidemia  Latest LDL 87 (12/03/2017).given elevated liver enzymes and her advance age will discontinue Lipitor 5 mg tablet daily to prevent muscle weakness.Recheck lipid panel in 3 months.    Family/ staff Communication: Reviewed plan of care with patient and facility Nurse.   Labs/tests ordered: CBC/diff 12/21/2017.Lipid panel in 3 months.    Caesar Bookman, NP

## 2017-12-21 ENCOUNTER — Encounter: Payer: Self-pay | Admitting: Internal Medicine

## 2017-12-21 ENCOUNTER — Encounter: Payer: Self-pay | Admitting: Family

## 2017-12-21 ENCOUNTER — Non-Acute Institutional Stay (SKILLED_NURSING_FACILITY): Payer: Medicare Other | Admitting: Family

## 2017-12-21 DIAGNOSIS — F419 Anxiety disorder, unspecified: Secondary | ICD-10-CM

## 2017-12-21 DIAGNOSIS — I5032 Chronic diastolic (congestive) heart failure: Secondary | ICD-10-CM

## 2017-12-21 NOTE — Progress Notes (Signed)
Location:  [facility] Room Number: 5 Place of Service:  SNF (31) Provider: Chantay Whitelock FNP-C  Mahitha Hickling, Donalee Citrin, NP  Patient Care Team: Tacha Manni, Donalee Citrin, NP as PCP - General (Family Medicine) [place], Peter M, MD as PCP - Cardiology (Cardiology)  Extended Emergency Contact Information Primary Emergency Contact: Norm Parcel States of [place] Phone: [phone] Relation: Daughter Secondary Emergency Contact: Bernette Redbird [place] of [place] Mobile Phone: [phone] Relation: Daughter  Code Status:  DNR Goals of care: Advanced Directive information Advanced Directives 12/15/2017  Does Patient Have a Medical Advance Directive? Yes  Type of Estate agent of [place];Out of facility DNR (pink MOST or yellow form);Living will  Does patient want to make changes to medical advance directive? -  Copy of Healthcare Power of Attorney in Chart? Yes  Would patient like information on creating a medical advance directive? -  Pre-existing out of facility DNR order (yellow form or pink MOST form) Yellow form placed in chart (order not valid for inpatient use);Pink MOST form placed in chart (order not valid for inpatient use)     Chief Complaint  Patient presents with  . Acute Visit    edema/anxiety per POA     HPI:  Pt is a 84 y.o. female seen today at [facility] for an acute visit for evaluation of leg edema and anxiety per POA request.she is seen in her room today with facility charge Nurse present at bedside.Nurse reported via SBAR that patient's POA request increase in patient's diuretics due to leg edema.POA would also like patient started on antianxiety.Patient denies any anxiety and does not want any medication initiated for now.she is currently on doxycycline for recent pneumonia.she is on Furosemide 20 mg tablet every other day.she has a significant medical history of chronic diastolic CHF,chronic Respiratory  failure with Hypoxia,Afib among other conditions.she denies any fever,chills or worsening cough.she has had no recent abrupt weight gain.   Past Medical History:  Diagnosis Date  . Anticoagulated    Eliquis  . Atrial flutter (HCC)    NO CARDIOLOGIST FOLLOWED BY DR Tori Milks  . Bloody stools    LAST 4 DAYS  . CHF (congestive heart failure) (HCC)   . Chronic back pain   . CKD (chronic kidney disease), stage III (HCC)    Creatinine 1.35 -1.55  . COPD (chronic obstructive pulmonary disease) (HCC)   . Diverticulosis   . Dyspnea   . Dysrhythmia   . Edema   . Gait disorder   . Glaucoma    Severe  . Glaucoma    both eyes  . High cholesterol   . Multinodular goiter   . PAOD (peripheral arterial occlusive disease) (HCC)    Stein in right groin, decrease pulses  . PMR (polymyalgia rheumatica) (HCC)   . Pneumonia, organism unspecified(486) 04/18/2016  . Protein-calorie malnutrition (HCC)   . Rash of back   . Weakness    Past Surgical History:  Procedure Laterality Date  . COLONOSCOPY N/A 07/15/2013   Procedure: COLONOSCOPY;  Surgeon: Louis Meckel, MD;  Location: [facility];  Service: Endoscopy;  Laterality: N/A;  . ESOPHAGOGASTRODUODENOSCOPY N/A 07/08/2013   Procedure: ESOPHAGOGASTRODUODENOSCOPY (EGD);  Surgeon: Louis Meckel, MD;  Location: [facility];  Service: Endoscopy;  Laterality: N/A;  Per Connye Burkitt, will try to have CRNA available, but will not guaranteee. Ok to schedule per Clydie Braun 07/06/13  . GALLBLADDER SURGERY    . IR GENERIC HISTORICAL  05/18/2014  IR RADIOLOGIST EVAL & MGMT 05/18/2014 Berdine DanceMichael Shick, MD [facility] INTERV RAD  . LAMINECTOMY    . leg stent Right   . SHOULDER SURGERY     frozen shoulder/ Bil shoulders    Allergies  Allergen Reactions  . Celebrex [Celecoxib]   . Codeine Sulfate Nausea And Vomiting and Other (See Comments)    headache  . Evista [Raloxifene Hcl]   . Fosamax [Alendronate Sodium]     Outpatient Encounter Medications as of  12/21/2017  Medication Sig  . aspirin 81 MG chewable tablet Chew 81 mg by mouth daily.  Marland Kitchen. atorvastatin (LIPITOR) 10 MG tablet Take 0.5 tablets (5 mg total) by mouth daily.  . bimatoprost (LUMIGAN) 0.01 % SOLN Place 1 drop into both eyes at bedtime.  . cholecalciferol (VITAMIN D) 1000 UNITS tablet Take 1,000 Units by mouth at bedtime.   Marland Kitchen. diltiazem (CARDIZEM CD) 360 MG 24 hr capsule Take 1 capsule (360 mg total) by mouth daily.  Marland Kitchen. ELIQUIS 2.5 MG TABS tablet Take 2.5 mg by mouth 2 (two) times daily.  . furosemide (LASIX) 20 MG tablet Take 1 tablet (20 mg total) by mouth every other day.  . ipratropium-albuterol (DUONEB) 0.5-2.5 (3) MG/3ML SOLN Take 3 mLs by nebulization 4 (four) times daily.  . Nutritional Supplements (BOOST PLUS PO) Take 1 Can by mouth 2 (two) times daily between meals.  . OXYGEN Inhale 4 L into the lungs daily.   . potassium chloride (K-DUR,KLOR-CON) 10 MEQ tablet Take 10 mEq by mouth every other day. Take potassium if you are taking lasix  . tetrahydrozoline 0.05 % ophthalmic solution Place 1 drop into both eyes as needed.   . traMADol-acetaminophen (ULTRACET) 37.5-325 MG tablet Take 1 tablet by mouth 2 (two) times daily as needed.  . umeclidinium-vilanterol (ANORO ELLIPTA) 62.5-25 MCG/INH AEPB Inhale 1 puff into the lungs daily.  . VENTOLIN HFA 108 (90 Base) MCG/ACT inhaler INHALE 2 PUFFS INTO LUNGS EVERY 6 HOURS AS NEEDED FOR WHEEZING OR SHORTNESS OF BREATH  . vitamin E (VITAMIN E) 400 UNIT capsule Take 400 Units by mouth daily.   No facility-administered encounter medications on file as of 12/21/2017.     Review of Systems  Constitutional: Negative for chills, fatigue and fever.  Respiratory: Negative for chest tightness and wheezing.        Chronic shortness of breath   Cardiovascular: Positive for leg swelling. Negative for chest pain and palpitations.  Gastrointestinal: Negative for abdominal distention, abdominal pain, constipation, diarrhea, nausea and vomiting.    Musculoskeletal: Positive for gait problem.  Skin: Negative for color change, pallor and rash.  Neurological: Negative for dizziness, light-headedness and headaches.  Psychiatric/Behavioral: Negative for agitation and sleep disturbance. The patient is not nervous/anxious.     Immunization History  Administered Date(s) Administered  . Influenza Split 12/30/2016  . Influenza, High Dose Seasonal PF 01/05/2016  . Influenza-Unspecified 12/06/2013  . Pneumococcal Polysaccharide-23 04/07/1994  . Zoster 05/07/1989   Pertinent  Health Maintenance Due  Topic Date Due  . PNA vac Low Risk Adult (1 of 2 - PCV13) 04/08/1995  . INFLUENZA VACCINE  11/05/2017  . DEXA SCAN  Completed   Fall Risk  11/18/2017  Falls in the past year? No    Vitals:   12/21/17 1422  BP: 100/78  Pulse: 90  Resp: (!) 22  Temp: 97.8 F (36.6 C)  SpO2: 93%  Weight: 108 lb (49 kg)  Height: 5\' 3"  (1.6 m)   Body mass index is 19.13 kg/m. Physical  Exam  Constitutional: She is oriented to person, place, and time.  Thin built elderly in no acute distress   HENT:  Head: Normocephalic.  Mouth/Throat: Oropharynx is clear and moist. No oropharyngeal exudate.  Eyes: Pupils are equal, round, and reactive to light. Conjunctivae are normal. Right eye exhibits no discharge. Left eye exhibits no discharge. No scleral icterus.  Neck: Normal range of motion. No JVD present.  Cardiovascular: Intact distal pulses. An irregular rhythm present. Exam reveals no gallop and no friction rub.  No murmur heard. Pulmonary/Chest: Effort normal. She has no wheezes. She has no rales.  Bilateral lung Poor air entry.on Oxygen 4 liters via nasal cannula.   Abdominal: Soft. Bowel sounds are normal. She exhibits no distension and no mass. There is no tenderness. There is no rebound and no guarding.  Musculoskeletal: She exhibits no tenderness.  Unsteady gait ambulates with walker.Bilateral lower extremities pitting edema. Knee high ted hose in  place.   Lymphadenopathy:    She has no cervical adenopathy.  Neurological: She is oriented to person, place, and time.  Skin: Skin is warm and dry. No rash noted. No erythema. No pallor.  Psychiatric: She has a normal mood and affect. Her speech is normal and behavior is normal. Judgment and thought content normal.  Nursing note and vitals reviewed.   Labs reviewed: Recent Labs    01/13/17 0534  10/14/17 0000 10/22/17 1148 12/03/17 0810 12/14/17  NA 137   < > 141 142 140 139  K 5.3*   < > 4.0 4.6 3.9 5.1  CL 103   < > 106 102 106  --   CO2 23   < > 24 20 20   --   GLUCOSE 152*   < > 88 80 102*  --   BUN 36*   < > 32* 28* 26* 37*  CREATININE 1.87*   < > 2.05* 1.94* 1.92* 2.11  CALCIUM 9.0   < > 9.4 9.6 9.3 9.2  MG 2.3  --   --   --   --   --    < > = values in this interval not displayed.   Recent Labs    08/24/17 1211 09/03/17 10/14/17 0000 12/03/17 0810 12/14/17  AST 25 19 19  32 53  ALT 25 53 13 35* 58  ALKPHOS 80 58  --   --  69  BILITOT 0.8 0.6 0.9 0.7 0.6  PROT 8.0  --  6.4 6.6 5.7  ALBUMIN 3.4* 3.3  --   --  3.9   Recent Labs    08/24/17 1211  08/27/17 0524  10/14/17 0000 10/22/17 1148 12/14/17  WBC 10.7*   < > 20.0*   < > 9.6 7.7 13.1  NEUTROABS 8.6*  --  18.9*  --  6,614  --   --   HGB 15.2*   < > 11.3*   < > 14.1 13.5 12.9  HCT 45.0   < > 34.5*   < > 41.6 42.3 39.2  MCV 91.3   < > 90.6   < > 90.2 94 91.6  PLT 417*   < > 448*  --  323 307  --    < > = values in this interval not displayed.   Lab Results  Component Value Date   TSH 3.58 05/15/2016   No results found for: HGBA1C Lab Results  Component Value Date   CHOL 166 12/03/2017   HDL 53 12/03/2017   LDLCALC 87 12/03/2017  TRIG 158 (H) 12/03/2017   CHOLHDL 3.1 12/03/2017    Significant Diagnostic Results in last 30 days:  No results found.  Assessment/Plan  1. Chronic diastolic congestive heart failure (HCC) No abrupt weight changes or worsening cough.worsening bilateral leg  edema.Bilateral lung poor entry.change furosemide to 40 mg tablet every other day.Increase Potassium to 20 mg tablet every other day.check BMP in 1 week.   2. Anxiety  Symptoms of anxiety reported per POA and request antianxiety but patient denies symptoms of anxiety.she does not want to start on any medication for now.Facility Nurse reports no signs or symptoms of anxiety.will continue to monitor for now.    Family/ staff Communication: Reviewed plan of care with patient and facility Nurse   Labs/tests ordered: BMP 12/28/2017   Caesar Bookman, NP

## 2017-12-22 LAB — CBC AND DIFFERENTIAL
HCT: 41 (ref 36–46)
HEMOGLOBIN: 13.4 (ref 12.0–16.0)
PLATELETS: 262 (ref 150–399)
WBC: 9.3

## 2017-12-22 LAB — HEPATIC FUNCTION PANEL
ALT: 22 (ref 7–35)
AST: 65 — AB (ref 13–35)
Alkaline Phosphatase: 65 (ref 25–125)
BILIRUBIN DIRECT: 0.2 (ref 0.01–0.4)
Bilirubin, Total: 0.7

## 2017-12-28 LAB — BASIC METABOLIC PANEL
BUN: 30 — AB (ref 4–21)
Creatinine: 2 — AB (ref 0.5–1.1)
GLUCOSE: 84
Potassium: 3.8 (ref 3.4–5.3)
Sodium: 141 (ref 137–147)

## 2018-01-06 ENCOUNTER — Telehealth: Payer: Self-pay

## 2018-01-06 NOTE — Telephone Encounter (Signed)
Patient's daughter called to request that lorazepam 0.5 mg given to patient on a scheduled basis twice daily for breathing.  (note: lorazepam is not on current med list.)    She stated that patient is currently prescribed lorazepam 0.5 mg tablets twice daily as needed for breathing but patient has not been asking for the medication so she has not been getting it.   She would also like to have vitamin D and vitamin E discontinued. She stated that these medication changes were discussed during a care plan meeting today and she would like to initiate these changes.    Pt daughter may be reached at [phone] Derrell Lolling) to discuss any questions or concerns.   Please advise if ok to make changes.

## 2018-01-06 NOTE — Telephone Encounter (Signed)
Ngetich, Dinah C, NP  You 8 minutes ago (4:12 PM)    Will make changes per patient's daughter's request.    Routing comment      Patient's medication list has been updated and daughter has been notified.

## 2018-01-07 ENCOUNTER — Encounter: Payer: Self-pay | Admitting: Family

## 2018-01-07 ENCOUNTER — Non-Acute Institutional Stay (SKILLED_NURSING_FACILITY): Payer: Medicare Other | Admitting: Family

## 2018-01-07 DIAGNOSIS — J9611 Chronic respiratory failure with hypoxia: Secondary | ICD-10-CM | POA: Diagnosis not present

## 2018-01-07 DIAGNOSIS — I5032 Chronic diastolic (congestive) heart failure: Secondary | ICD-10-CM | POA: Diagnosis not present

## 2018-01-07 NOTE — Progress Notes (Signed)
Location:  [facility] Room Number: 5 Place of Service:  SNF (31) Provider: Hall Birchard FNP-C  Shandrell Boda, Donalee Citrin, NP  Patient Care Team: Yuritzy Zehring, Donalee Citrin, NP as PCP - General (Family Medicine) [place], Peter M, MD as PCP - Cardiology (Cardiology)  Extended Emergency Contact Information Primary Emergency Contact: Norm Parcel [place] of [place] Phone: [phone] Relation: Daughter Secondary Emergency Contact: Bernette Redbird [place] of [place] Mobile Phone: [phone] Relation: Daughter  Code Status:  DNR Goals of care: Advanced Directive information Advanced Directives 01/07/2018  Does Patient Have a Medical Advance Directive? Yes  Type of Estate agent of [place];Living will;Out of facility DNR (pink MOST or yellow form)  Does patient want to make changes to medical advance directive? -  Copy of Healthcare Power of Attorney in Chart? Yes  Would patient like information on creating a medical advance directive? -  Pre-existing out of facility DNR order (yellow form or pink MOST form) Yellow form placed in chart (order not valid for inpatient use);Pink MOST form placed in chart (order not valid for inpatient use)     Chief Complaint  Patient presents with  . Acute Visit    Shortness of Breath    HPI:  Pt is a 84 y.o. female seen today at [facility] for an acute visit for evaluation of shortness of breath.she is seen in her room today per facility Nurse request.Nurse reports patient's oxygen saturation was 80 % Duonebs given oxygen saturations went up to 82 %.Nonrebreather Mask applied saturations rechecked 90%.she denies any fever or chills.Facility Patient's weight log reviewed has had a 9 pounds weight gain over one day WT 109 lbs ( 01/05/2018) and 118 lbs ( 01/06/2018).    Past Medical History:  Diagnosis Date  . Anticoagulated    Eliquis  . Atrial flutter (HCC)    NO CARDIOLOGIST FOLLOWED BY DR  Tori Milks  . Bloody stools    LAST 4 DAYS  . CHF (congestive heart failure) (HCC)   . Chronic back pain   . CKD (chronic kidney disease), stage III (HCC)    Creatinine 1.35 -1.55  . COPD (chronic obstructive pulmonary disease) (HCC)   . Diverticulosis   . Dyspnea   . Dysrhythmia   . Edema   . Gait disorder   . Glaucoma    Severe  . Glaucoma    both eyes  . High cholesterol   . Multinodular goiter   . PAOD (peripheral arterial occlusive disease) (HCC)    Stein in right groin, decrease pulses  . PMR (polymyalgia rheumatica) (HCC)   . Pneumonia, organism unspecified(486) 04/18/2016  . Protein-calorie malnutrition (HCC)   . Rash of back   . Weakness    Past Surgical History:  Procedure Laterality Date  . COLONOSCOPY N/A 07/15/2013   Procedure: COLONOSCOPY;  Surgeon: Louis Meckel, MD;  Location: [facility]OSCOPY;  Service: Endoscopy;  Laterality: N/A;  . ESOPHAGOGASTRODUODENOSCOPY N/A 07/08/2013   Procedure: ESOPHAGOGASTRODUODENOSCOPY (EGD);  Surgeon: Louis Meckel, MD;  Location: [facility];  Service: Endoscopy;  Laterality: N/A;  Per Connye Burkitt, will try to have CRNA available, but will not guaranteee. Ok to schedule per Clydie Braun 07/06/13  . GALLBLADDER SURGERY    . IR GENERIC HISTORICAL  05/18/2014   IR RADIOLOGIST EVAL & MGMT 05/18/2014 Berdine Dance, MD [facility] INTERV RAD  . LAMINECTOMY    . leg stent Right   . SHOULDER SURGERY     frozen shoulder/ Bil shoulders  Allergies  Allergen Reactions  . Celebrex [Celecoxib]   . Codeine Sulfate Nausea And Vomiting and Other (See Comments)    headache  . Evista [Raloxifene Hcl]   . Fosamax [Alendronate Sodium]     Outpatient Encounter Medications as of 01/07/2018  Medication Sig  . aspirin 81 MG chewable tablet Chew 81 mg by mouth daily.  . bimatoprost (LUMIGAN) 0.01 % SOLN Place 1 drop into both eyes at bedtime.  Marland Kitchen diltiazem (CARDIZEM CD) 360 MG 24 hr capsule Take 360 mg by mouth daily. hold if SBP <100 and or HR<60/min    . ELIQUIS 2.5 MG TABS tablet Take 2.5 mg by mouth 2 (two) times daily.  . furosemide (LASIX) 40 MG tablet Take 40 mg by mouth every other day. Hold for SBP<110  . ipratropium-albuterol (DUONEB) 0.5-2.5 (3) MG/3ML SOLN Take 3 mLs by nebulization 3 (three) times daily. administer while awake  . ipratropium-albuterol (DUONEB) 0.5-2.5 (3) MG/3ML SOLN Take 3 mLs by nebulization every 6 (six) hours as needed.  Marland Kitchen LORazepam (ATIVAN) 0.5 MG tablet Take 0.5 mg by mouth 2 (two) times daily.   . Nutritional Supplements (BOOST PLUS PO) Take 1 Can by mouth 2 (two) times daily between meals. Vanilla  . OXYGEN Inhale 4 L into the lungs daily.   . potassium chloride (K-DUR,KLOR-CON) 10 MEQ tablet Take 10 mEq by mouth every other day. Give with Furosemide.  . traMADol-acetaminophen (ULTRACET) 37.5-325 MG tablet Take 1 tablet by mouth 2 (two) times daily as needed.  . umeclidinium-vilanterol (ANORO ELLIPTA) 62.5-25 MCG/INH AEPB Inhale 1 puff into the lungs daily.  . VENTOLIN HFA 108 (90 Base) MCG/ACT inhaler INHALE 2 PUFFS INTO LUNGS EVERY 6 HOURS AS NEEDED FOR WHEEZING OR SHORTNESS OF BREATH  . [DISCONTINUED] atorvastatin (LIPITOR) 10 MG tablet Take 0.5 tablets (5 mg total) by mouth daily.  . [DISCONTINUED] diltiazem (CARDIZEM CD) 360 MG 24 hr capsule Take 1 capsule (360 mg total) by mouth daily.  . [DISCONTINUED] furosemide (LASIX) 20 MG tablet Take 1 tablet (20 mg total) by mouth every other day.  . [DISCONTINUED] ipratropium-albuterol (DUONEB) 0.5-2.5 (3) MG/3ML SOLN Take 3 mLs by nebulization 4 (four) times daily.  . [DISCONTINUED] tetrahydrozoline 0.05 % ophthalmic solution Place 1 drop into both eyes as needed.    No facility-administered encounter medications on file as of 01/07/2018.     Review of Systems  Constitutional: Negative for appetite change, chills, fatigue and fever.  Respiratory: Positive for cough and shortness of breath. Negative for chest tightness and wheezing.   Cardiovascular:  Positive for leg swelling. Negative for chest pain and palpitations.  Gastrointestinal: Negative for abdominal distention, abdominal pain, constipation, diarrhea, nausea and vomiting.  Skin: Negative for color change, pallor and rash.  Neurological: Negative for dizziness, light-headedness and headaches.  Psychiatric/Behavioral: Negative for agitation and sleep disturbance. The patient is not nervous/anxious.     Immunization History  Administered Date(s) Administered  . Influenza Split 12/30/2016  . Influenza, High Dose Seasonal PF 01/05/2016  . Influenza-Unspecified 12/06/2013  . Pneumococcal Polysaccharide-23 04/07/1994  . Zoster 05/07/1989   Pertinent  Health Maintenance Due  Topic Date Due  . PNA vac Low Risk Adult (1 of 2 - PCV13) 04/08/1995  . INFLUENZA VACCINE  11/05/2017  . DEXA SCAN  Completed   Fall Risk  11/18/2017  Falls in the past year? No    Vitals:   01/07/18 1436  BP: 119/75  Pulse: 74  Resp: 20  Temp: 97.6 F (36.4 C)  TempSrc: Oral  SpO2: 90%  Weight: 118 lb (53.5 kg)  Height: 5\' 3"  (1.6 m)   Body mass index is 20.9 kg/m. Physical Exam  Constitutional: She is oriented to person, place, and time.  Frail elderly in no acute distress   HENT:  Head: Normocephalic.  Mouth/Throat: Oropharynx is clear and moist. No oropharyngeal exudate.  Eyes: Pupils are equal, round, and reactive to light. Right eye exhibits no discharge. Left eye exhibits no discharge. No scleral icterus.  Neck: Normal range of motion. No JVD present.  Cardiovascular: An irregularly irregular rhythm present. Exam reveals no gallop and no friction rub.  No murmur heard. Pulmonary/Chest: Effort normal. She has no wheezes. She has no rales.  Poor air entry   Abdominal: Soft. Bowel sounds are normal. She exhibits no distension and no mass. There is no tenderness. There is no rebound and no guarding.  Musculoskeletal: She exhibits no tenderness.  Moves x 4 extremities unsteady gait  ambulates with Rolator.bilateral lower extremities 3+ edema.Knee high ted hose in place   Lymphadenopathy:    She has no cervical adenopathy.  Neurological: She is oriented to person, place, and time. Gait abnormal.  Skin: Skin is warm and dry. No rash noted. No erythema. No pallor.  Psychiatric: She has a normal mood and affect. Her speech is normal and behavior is normal. Judgment and thought content normal.  Nursing note and vitals reviewed.  Labs reviewed: Recent Labs    01/13/17 0534  10/14/17 0000 10/22/17 1148 12/03/17 0810 12/14/17 12/28/17  NA 137   < > 141 142 140 139 141  K 5.3*   < > 4.0 4.6 3.9 5.1 3.8  CL 103   < > 106 102 106  --   --   CO2 23   < > 24 20 20   --   --   GLUCOSE 152*   < > 88 80 102*  --   --   BUN 36*   < > 32* 28* 26* 37* 30*  CREATININE 1.87*   < > 2.05* 1.94* 1.92* 2.11 2.0*  CALCIUM 9.0   < > 9.4 9.6 9.3 9.2  --   MG 2.3  --   --   --   --   --   --    < > = values in this interval not displayed.   Recent Labs    08/24/17 1211 09/03/17 10/14/17 0000 12/03/17 0810 12/14/17 12/22/17  AST 25 19 19  32 53 65*  ALT 25 53 13 35* 58 22  ALKPHOS 80 58  --   --  69 65  BILITOT 0.8 0.6 0.9 0.7 0.6  --   PROT 8.0  --  6.4 6.6 5.7  --   ALBUMIN 3.4* 3.3  --   --  3.9  --    Recent Labs    08/24/17 1211  08/27/17 0524  10/14/17 0000 10/22/17 1148 12/14/17 12/22/17  WBC 10.7*   < > 20.0*   < > 9.6 7.7 13.1 9.3  NEUTROABS 8.6*  --  18.9*  --  6,614  --   --   --   HGB 15.2*   < > 11.3*   < > 14.1 13.5 12.9 13.4  HCT 45.0   < > 34.5*   < > 41.6 42.3 39.2 41  MCV 91.3   < > 90.6   < > 90.2 94 91.6  --   PLT 417*   < > 448*  --  323 307  --  262   < > = values in this interval not displayed.   Lab Results  Component Value Date   TSH 3.58 05/15/2016   No results found for: HGBA1C Lab Results  Component Value Date   CHOL 166 12/03/2017   HDL 53 12/03/2017   LDLCALC 87 12/03/2017   TRIG 158 (H) 12/03/2017   CHOLHDL 3.1 12/03/2017     Significant Diagnostic Results in last 30 days:  No results found.  Assessment/Plan 1. Chronic diastolic congestive heart failure (HCC) Has had a 9 pound weight gain over one day.bilateral lower extremities 3+ edema and poor air entry to both lungs noted. - continue on Furosemide 40 mg tablet every other day. - Add Furosemide 20 mg tablet daily at 2 Pm x 5 days.continue on KCL supplement.  - Portable CXR 2 views rule out PNA and fluid overload.  - monitor daily weight  - check BMP and Bnp 01/11/2018   2. Chronic respiratory failure with hypoxia (HCC) Breathing stable.Poor air entry on auscultation.Portable CXR as above.continue on continuous oxygen via nasal cannula.continue current inhalers.   Family/ staff Communication: Reviewed plan of care with patient and facility Nurse   Labs/tests ordered: - BMP and Bnp 01/11/2018 -   Portable CXR 2 views  Caesar Bookman, NP

## 2018-01-13 ENCOUNTER — Telehealth: Payer: Self-pay

## 2018-01-13 NOTE — Telephone Encounter (Addendum)
Patient's daughter called requesting advice from Dr.Miller or Dinah.  Patient is currently under the care of a cardiologist and pulmonologist.   Patient would like for all of her medical conditions to be addressed by her primary care doctor and not seek care from outside sources. Patient has a pending appointment on 01/27/18 for a follow-up with cardiology and would like to cancel if you all will agree to address her cardiac and pulmonary concerns.   Per patient's daughter patient has a MOST form on file if you all need to review.  Please advise

## 2018-01-13 NOTE — Telephone Encounter (Signed)
Discussed [name]'s response with patient's daughter:  I recommend planning a meeting with patient's daughter to discuss patient's care.facility social worker can assist With arranging for the meeting.

## 2018-01-18 ENCOUNTER — Non-Acute Institutional Stay (SKILLED_NURSING_FACILITY): Payer: Medicare Other | Admitting: Family

## 2018-01-18 ENCOUNTER — Encounter: Payer: Self-pay | Admitting: Family

## 2018-01-18 DIAGNOSIS — J9611 Chronic respiratory failure with hypoxia: Secondary | ICD-10-CM | POA: Diagnosis not present

## 2018-01-18 DIAGNOSIS — G8929 Other chronic pain: Secondary | ICD-10-CM

## 2018-01-18 DIAGNOSIS — M545 Low back pain: Secondary | ICD-10-CM

## 2018-01-18 DIAGNOSIS — Z7189 Other specified counseling: Secondary | ICD-10-CM | POA: Diagnosis not present

## 2018-01-18 DIAGNOSIS — I5032 Chronic diastolic (congestive) heart failure: Secondary | ICD-10-CM | POA: Diagnosis not present

## 2018-01-18 DIAGNOSIS — I480 Paroxysmal atrial fibrillation: Secondary | ICD-10-CM | POA: Diagnosis not present

## 2018-01-18 NOTE — Progress Notes (Signed)
Location:  [facility] Room Number: 5 Place of Service:  SNF (31) Provider: Paisly Fingerhut FNP-C   Mansi Tokar, Nelda Bucks, NP  Patient Care Team: Evolet Salminen, Nelda Bucks, NP as PCP - General (Family Medicine) [place], Peter M, MD as PCP - Cardiology (Cardiology)  Extended Emergency Contact Information Primary Emergency Contact: [place] of [place] Phone: [phone] Relation: Daughter Secondary Emergency Contact: [place] of [place] Mobile Phone: [phone] Relation: Daughter  Code Status:  DNR Goals of care: Advanced Directive information Advanced Directives 01/07/2018  Does Patient Have a Medical Advance Directive? Yes  Type of Paramedic of [place];Living will;Out of facility DNR (pink MOST or yellow form)  Does patient want to make changes to medical advance directive? -  Copy of [place] in Chart? Yes  Would patient like information on creating a medical advance directive? -  Pre-existing out of facility DNR order (yellow form or pink MOST form) Yellow form placed in chart (order not valid for inpatient use);Pink MOST form placed in chart (order not valid for inpatient use)     Chief Complaint  Patient presents with  . Medical Management of Chronic Issues    Routine visit,ACP    HPI:  Pt is a 84 y.o. female seen today [place] for medical management of chronic diseases.she has a medical history of Afib,chronic diastolic congestive heart failure,chronic respiratory failure with hypoxia,PVD,chronic back pain among other conditions.she is seen today in her room with her daughter Lemmie Steinhaus and facility social worker present at bedside.she continues to have shortness of breath at rest and worse with exertion.she denies new acute issues.No recent fall episodes reported.she denies any fever or chills.   Patient's daughter would like to discussed upcoming appointment  with cardiology and pulmonary.she would medication to be managed by facility provider instead of going to specialist due to patient's shortness of breath with exertion.Patient states does not want to follow up with specialist for now but focus on comfort care.she would also like to revise MOST to include comfort measures only and transfer to the hospital unless comfort needs cannot be met in current facility.Previous MOST form void and signed by patient,HCPOA Penni Bombard and facility social worker.New MOST form filled out copy given to [facility] and original copy placed in patient's chart.POA also request morphine to be ordered for patient's low back pain and shortness of breath.     Past Medical History:  Diagnosis Date  . Anticoagulated    Eliquis  . Atrial flutter ([place])    NO CARDIOLOGIST FOLLOWED BY DR Conni Slipper  . Bloody stools    LAST 4 DAYS  . CHF (congestive heart failure) ([place])   . Chronic back pain   . CKD (chronic kidney disease), stage III (HCC)    Creatinine 1.35 -1.55  . COPD (chronic obstructive pulmonary disease) ([place])   . Diverticulosis   . Dyspnea   . Dysrhythmia   . Edema   . Gait disorder   . Glaucoma    Severe  . Glaucoma    both eyes  . High cholesterol   . Multinodular goiter   . PAOD (peripheral arterial occlusive disease) (HCC)    Stein in right groin, decrease pulses  . PMR (polymyalgia rheumatica) (HCC)   . Pneumonia, organism unspecified(486) 04/18/2016  . Protein-calorie malnutrition ([place])   . Rash of back   . Weakness    Past Surgical History:  Procedure Laterality Date  .  COLONOSCOPY N/A 07/15/2013   Procedure: COLONOSCOPY;  Surgeon: Inda Castle, MD;  Location: [facility];  Service: Endoscopy;  Laterality: N/A;  . ESOPHAGOGASTRODUODENOSCOPY N/A 07/08/2013   Procedure: ESOPHAGOGASTRODUODENOSCOPY (EGD);  Surgeon: Inda Castle, MD;  Location: [facility];  Service: Endoscopy;  Laterality: N/A;  Per Gonzella Lex, will try to have CRNA  available, but will not guaranteee. Ok to schedule per Santiago Glad 07/06/13  . GALLBLADDER SURGERY    . IR GENERIC HISTORICAL  05/18/2014   IR RADIOLOGIST EVAL & MGMT 05/18/2014 Greggory Keen, MD [facility] INTERV RAD  . LAMINECTOMY    . leg stent Right   . SHOULDER SURGERY     frozen shoulder/ Bil shoulders    Allergies  Allergen Reactions  . Celebrex [Celecoxib]   . Codeine Sulfate Nausea And Vomiting and Other (See Comments)    headache  . Evista [Raloxifene Hcl]   . Fosamax [Alendronate Sodium]     Allergies as of 01/18/2018      Reactions   Celebrex [celecoxib]    Codeine Sulfate Nausea And Vomiting, Other (See Comments)   headache   Evista [raloxifene Hcl]    Fosamax [alendronate Sodium]       Medication List        Accurate as of 01/18/18  3:43 PM. Always use your most recent med list.          aspirin 81 MG chewable tablet Chew 81 mg by mouth daily.   bimatoprost 0.01 % Soln Commonly known as:  LUMIGAN Place 1 drop into both eyes at bedtime.   BOOST PLUS PO Take 1 Can by mouth 2 (two) times daily between meals. Vanilla   diltiazem 360 MG 24 hr capsule Commonly known as:  CARDIZEM CD Take 360 mg by mouth daily. hold if SBP <100 and or HR<60/min   ELIQUIS 2.5 MG Tabs tablet Generic drug:  apixaban Take 2.5 mg by mouth 2 (two) times daily.   furosemide 40 MG tablet Commonly known as:  LASIX Take 40 mg by mouth every other day. Hold for SBP<110   ipratropium-albuterol 0.5-2.5 (3) MG/3ML Soln Commonly known as:  DUONEB Take 3 mLs by nebulization 3 (three) times daily. administer while awake   ipratropium-albuterol 0.5-2.5 (3) MG/3ML Soln Commonly known as:  DUONEB Take 3 mLs by nebulization every 6 (six) hours as needed.   LORazepam 0.5 MG tablet Commonly known as:  ATIVAN Take 0.5 mg by mouth 2 (two) times daily.   OXYGEN Inhale 4 L into the lungs daily.   potassium chloride 10 MEQ tablet Commonly known as:  K-DUR,KLOR-CON Take 10 mEq by mouth every  other day. Give with Furosemide.   traMADol-acetaminophen 37.5-325 MG tablet Commonly known as:  ULTRACET Take 1 tablet by mouth 2 (two) times daily as needed.   umeclidinium-vilanterol 62.5-25 MCG/INH Aepb Commonly known as:  ANORO ELLIPTA Inhale 1 puff into the lungs daily.   VENTOLIN HFA 108 (90 Base) MCG/ACT inhaler Generic drug:  albuterol INHALE 2 PUFFS INTO LUNGS EVERY 6 HOURS AS NEEDED FOR WHEEZING OR SHORTNESS OF BREATH       Review of Systems  Constitutional: Negative for chills, fatigue and fever.  HENT: Negative for congestion, postnasal drip, rhinorrhea, sinus pressure, sinus pain, sneezing, sore throat and trouble swallowing.   Eyes: Positive for visual disturbance. Negative for discharge, redness and itching.       Wears eye glasses.  Respiratory: Positive for cough and shortness of breath. Negative for chest tightness and wheezing.  Chronic respiratory failure with hypoxia   Cardiovascular: Positive for leg swelling. Negative for chest pain and palpitations.  Gastrointestinal: Negative for abdominal distention, abdominal pain, constipation, diarrhea, nausea and vomiting.  Endocrine: Negative for cold intolerance, heat intolerance, polydipsia, polyphagia and polyuria.  Genitourinary: Negative for flank pain, frequency and urgency.  Musculoskeletal: Positive for gait problem.       Chronic back pain   Skin: Negative for color change, pallor, rash and wound.  Neurological: Negative for dizziness, light-headedness and headaches.  Hematological: Does not bruise/bleed easily.  Psychiatric/Behavioral: Negative for agitation and sleep disturbance. The patient is not nervous/anxious.     Immunization History  Administered Date(s) Administered  . Influenza Split 12/30/2016  . Influenza, High Dose Seasonal PF 01/05/2016  . Influenza-Unspecified 12/06/2013  . Pneumococcal Polysaccharide-23 04/07/1994  . Zoster 05/07/1989   Pertinent  Health Maintenance Due    Topic Date Due  . PNA vac Low Risk Adult (1 of 2 - PCV13) 04/08/1995  . INFLUENZA VACCINE  11/05/2017  . DEXA SCAN  Completed   Fall Risk  11/18/2017  Falls in the past year? No    Vitals:   01/18/18 1100  BP: 106/60  Pulse: 79  Resp: 20  Temp: 97.6 F (36.4 C)  SpO2: 90%  Weight: 108 lb (49 kg)  Height: _0  (1.6 m)   Body mass index is 19.13 kg/m. Physical Exam  Constitutional: She is oriented to person, place, and time.  Thin built,elderly short of breath with exertion   HENT:  Head: Normocephalic.  Right Ear: External ear normal.  Left Ear: External ear normal.  Mouth/Throat: Oropharynx is clear and moist. No oropharyngeal exudate.  Eyes: Pupils are equal, round, and reactive to light. Conjunctivae and EOM are normal. Right eye exhibits no discharge. Left eye exhibits no discharge. No scleral icterus.  Eye glasses in place   Neck: Normal range of motion. No JVD present.  Cardiovascular: Intact distal pulses. An irregularly irregular rhythm present. Exam reveals no gallop and no friction rub.  Pulmonary/Chest: She has no wheezes. She has no rales.  Bilateral poor air entry   Abdominal: Soft. Bowel sounds are normal. She exhibits no distension and no mass. There is no tenderness. There is no rebound and no guarding.  Musculoskeletal: She exhibits no tenderness.  Moves x 4 extremities.unsteady gait uses wheelchair.bilateral lower extremities 2+ edema.  Lymphadenopathy:    She has no cervical adenopathy.  Neurological: She is oriented to person, place, and time. Gait abnormal.  Skin: Skin is warm and dry. Capillary refill takes 2 to 3 seconds. No rash noted. No erythema. No pallor.  Skin intact   Psychiatric: She has a normal mood and affect. Her speech is normal and behavior is normal. Judgment and thought content normal.  Nursing note and vitals reviewed.   Labs reviewed: Recent Labs    10/14/17 0000 10/22/17 1148 12/03/17 0810 12/14/17 12/28/17  NA 141 142  140 139 141  K 4.0 4.6 3.9 5.1 3.8  CL 106 102 106  --   --   CO2 _1 --   --   GLUCOSE 88 80 102*  --   --   BUN 32* 28* 26* 37* 30*  CREATININE 2.05* 1.94* 1.92* 2.11 2.0*  CALCIUM 9.4 9.6 9.3 9.2  --    Recent Labs    08/24/17 1211 09/03/17 10/14/17 0000 12/03/17 0810 12/14/17 12/22/17  AST _2 32 53 65*  ALT 25 53 13 35* 58  22  ALKPHOS 80 58  --   --  69 65  BILITOT 0.8 0.6 0.9 0.7 0.6  --   PROT 8.0  --  6.4 6.6 5.7  --   ALBUMIN 3.4* 3.3  --   --  3.9  --    Recent Labs    08/24/17 1211  08/27/17 0524  10/14/17 0000 10/22/17 1148 12/14/17 12/22/17  WBC 10.7*   < > 20.0*   < > 9.6 7.7 13.1 9.3  NEUTROABS 8.6*  --  18.9*  --  6,614  --   --   --   HGB 15.2*   < > 11.3*   < > 14.1 13.5 12.9 13.4  HCT 45.0   < > 34.5*   < > 41.6 42.3 39.2 41  MCV 91.3   < > 90.6   < > 90.2 94 91.6  --   PLT 417*   < > 448*  --  323 307  --  262   < > = values in this interval not displayed.   Lab Results  Component Value Date   TSH 3.58 05/15/2016   No results found for: HGBA1C Lab Results  Component Value Date   CHOL 166 12/03/2017   HDL 53 12/03/2017   LDLCALC 87 12/03/2017   TRIG 158 (H) 12/03/2017   CHOLHDL 3.1 12/03/2017    Significant Diagnostic Results in last 30 days:  No results found.  Assessment/Plan 1. Chronic diastolic congestive heart failure (HCC) Shortness of breath worse with exertion.use of diuretic limited due to soft blood pressure readings at times.continue on Furosemide 40 mg tablet every other day.on Potassium supplements.Reduce diltiazem from 360 mg capsule to 240 mg capsule daily.will further reduce diltiazem if blood pressure readings remain soft.  2. Chronic respiratory failure with hypoxia (HCC) Shortness of breath worse with exertion.Continue on ventolin HFA inhaler ,Anoro ellipta daily and Duoneb three times daily and as every 6 hours as needed.Add Roxanol 20 mg/ml take 0.25 ml ( 5 mg) by mouth every 4 hours as needed for shortness of  breath.   3. PAF (paroxysmal atrial fibrillation) (HCC) Continue on Eliquis 2.5 mg tablet twice daily and ASA 81 mg tablet.reduce diltiazem as above due to low blood pressure readings. Continue to monitor for signs of bleedings.    4. Chronic low back pain  add Roxanol 20 mg/ml take 0.25 ml ( 5 mg) by mouth every 4 hours as needed for shortness of breath and low back pain.    5. ACP (advance care planning)  Last ACP Note 10/20/2017 to 01/18/2018       ACP (Advance Care Planning) by Sandrea Hughs, NP at 01/18/2018  2:41 PM    Date of Service   Author Author Type Status Note Type File Time  01/18/2018 Addend Fredna Stricker, Nelda Bucks, NP Nurse Practitioner Signed ACP (Advance Care Planning) 01/18/2018             Reviewed goals of care  with patient's daughter HCPOA Madigan Rosensteel and facility social worker present during this conversation.Patient's chart reviewed.DNR and MOST form in place but patient would like to void old MOST form and fill new form.Patient would like to maintain current DNR order in an event of no pulse and is not breathing.However,she would like to change from limited additional intervention which included transfer to hospital if indicated but avoid Intensive care in the previous MOST to comfort Measures and do not transfer to hospital unless comfort needs cannot be met  in current location.she would like antibiotics to be determined or limited use when infection occurs.May medically administer IV fluids if indicated but no feeding tube desired. MOST form has been filled out and signed by patient,HCPOA and facility social worker today.I've reviewed goals of care and filled MOST form between 11:00-11:35 am. Answered question/ concern from patient/daughter/HCPOA to the best of my knowledge.               Family/ staff Communication: Reviewed plan of care with patient and Patient's daughter Tammye Kahler, facility social worker and Nurse.  Labs/tests ordered: None   Dallis Czaja C  Candus Braud, NP

## 2018-01-18 NOTE — ACP (Advance Care Planning) (Signed)
Reviewed goals of care  with patient's daughter HCPOA [name] and facility social worker present during this conversation.Patient's chart reviewed.DNR and MOST form in place but patient would like to void old MOST form and fill new form.Patient would like to maintain current DNR order in an event of no pulse and is not breathing.However,she would like to change from limited additional intervention which included transfer to hospital if indicated but avoid Intensive care in the previous MOST to comfort Measures and do not transfer to hospital unless comfort needs cannot be met in current location.she would like antibiotics to be determined or limited use when infection occurs.May medically administer IV fluids if indicated but no feeding tube desired. MOST form has been filled out and signed by patient,HCPOA and facility social worker today.I've reviewed goals of care and filled MOST form between 11:00-11:35 am. Answered question/ concern from patient/daughter/HCPOA to the best of my knowledge.

## 2018-01-25 ENCOUNTER — Telehealth: Payer: Self-pay | Admitting: Pulmonary Disease

## 2018-01-25 ENCOUNTER — Encounter: Payer: Self-pay | Admitting: Internal Medicine

## 2018-01-25 NOTE — Telephone Encounter (Signed)
Called and spoke with pt's daughter Kendal Hymen who stated she wanted [place] to know that pt passed away 2022/09/04, 02/13/2023.  Routing to Dr. Craige Cotta as an [place]ain Childes.

## 2018-01-27 ENCOUNTER — Ambulatory Visit: Payer: Medicare Other | Admitting: Adult Health

## 2018-02-05 DEATH — deceased

## 2018-02-23 ENCOUNTER — Encounter: Payer: Self-pay | Admitting: Family

## 2018-04-28 IMAGING — CR DG CHEST 2V
2 series · 2 of 2 positions shown · non-contrast
Comparison: 12/31/2016

CLINICAL DATA: Shortness of breath

EXAM:
CHEST  2 VIEW

[w chest lat]
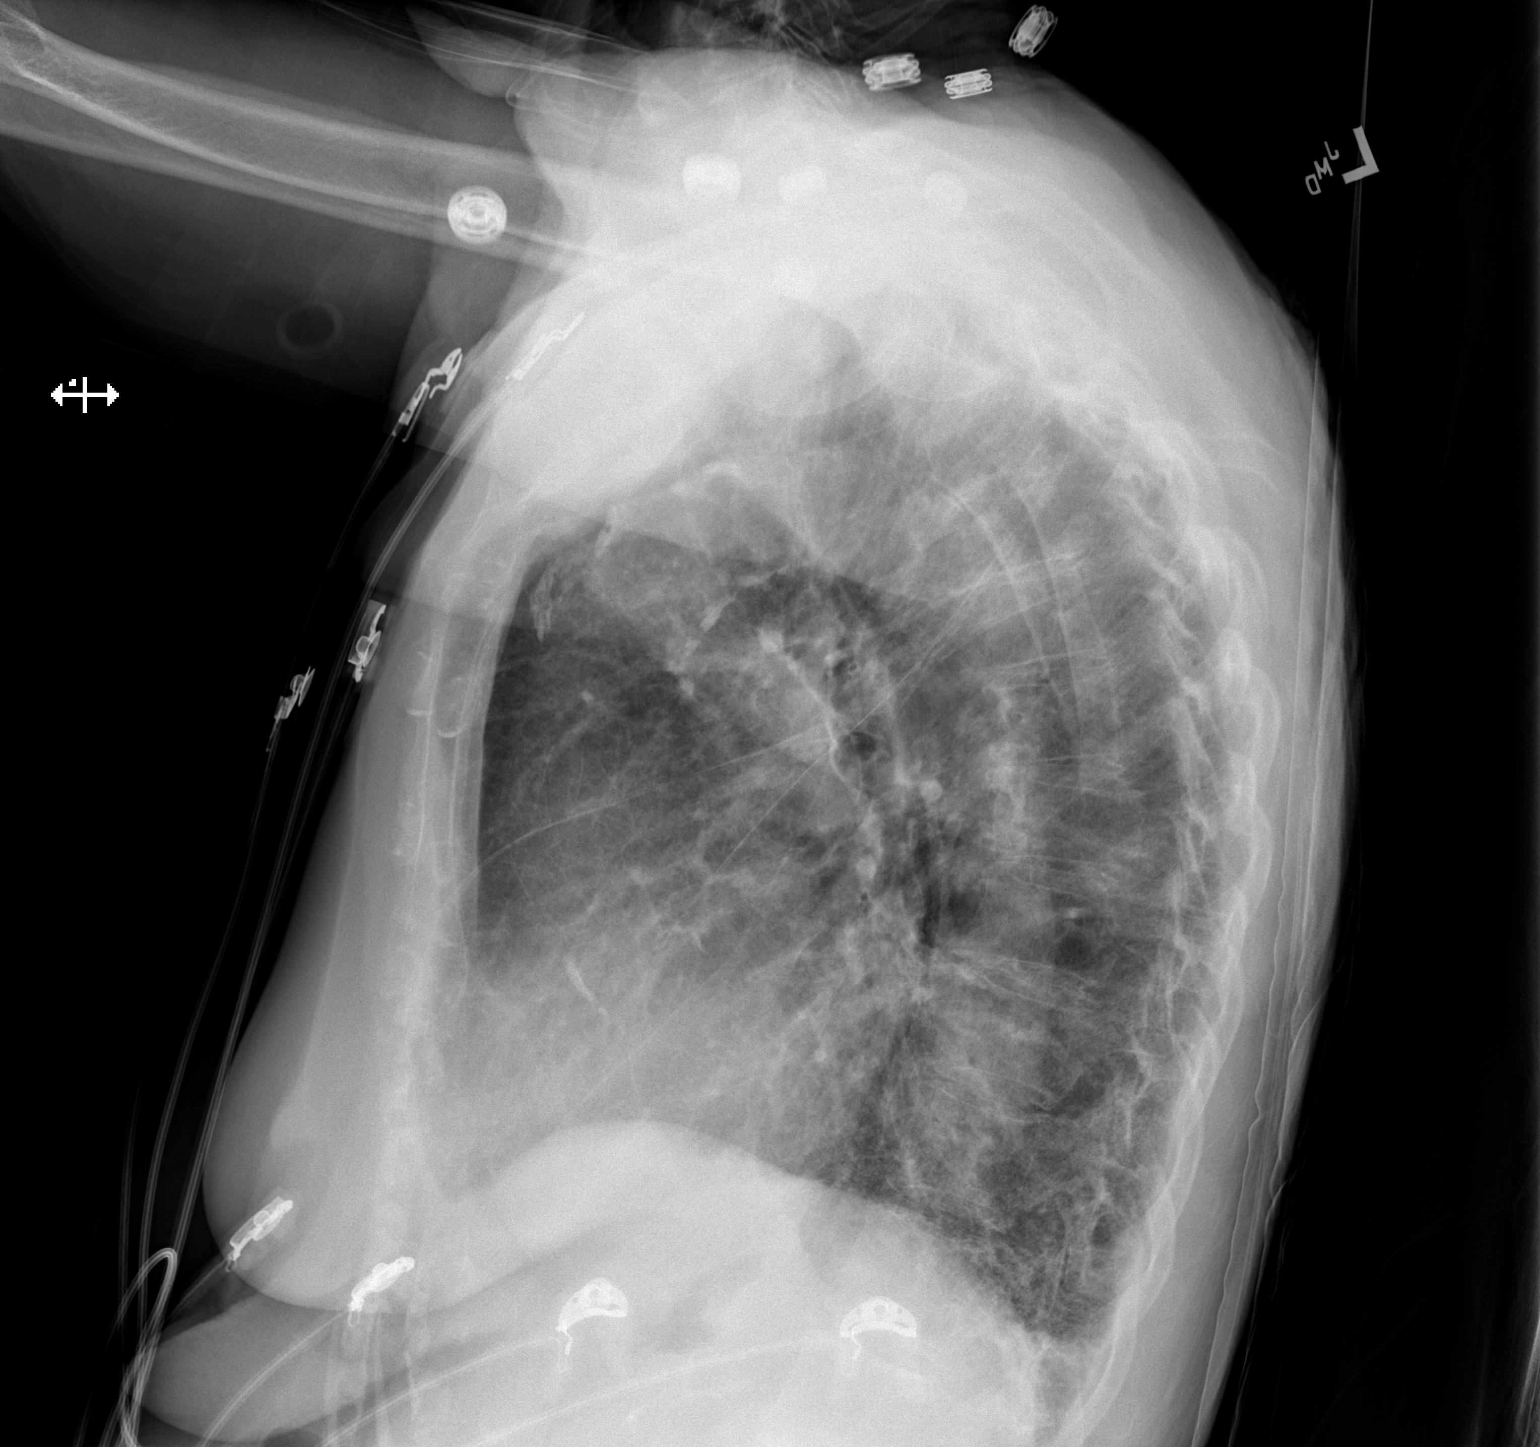

[x chest ap]
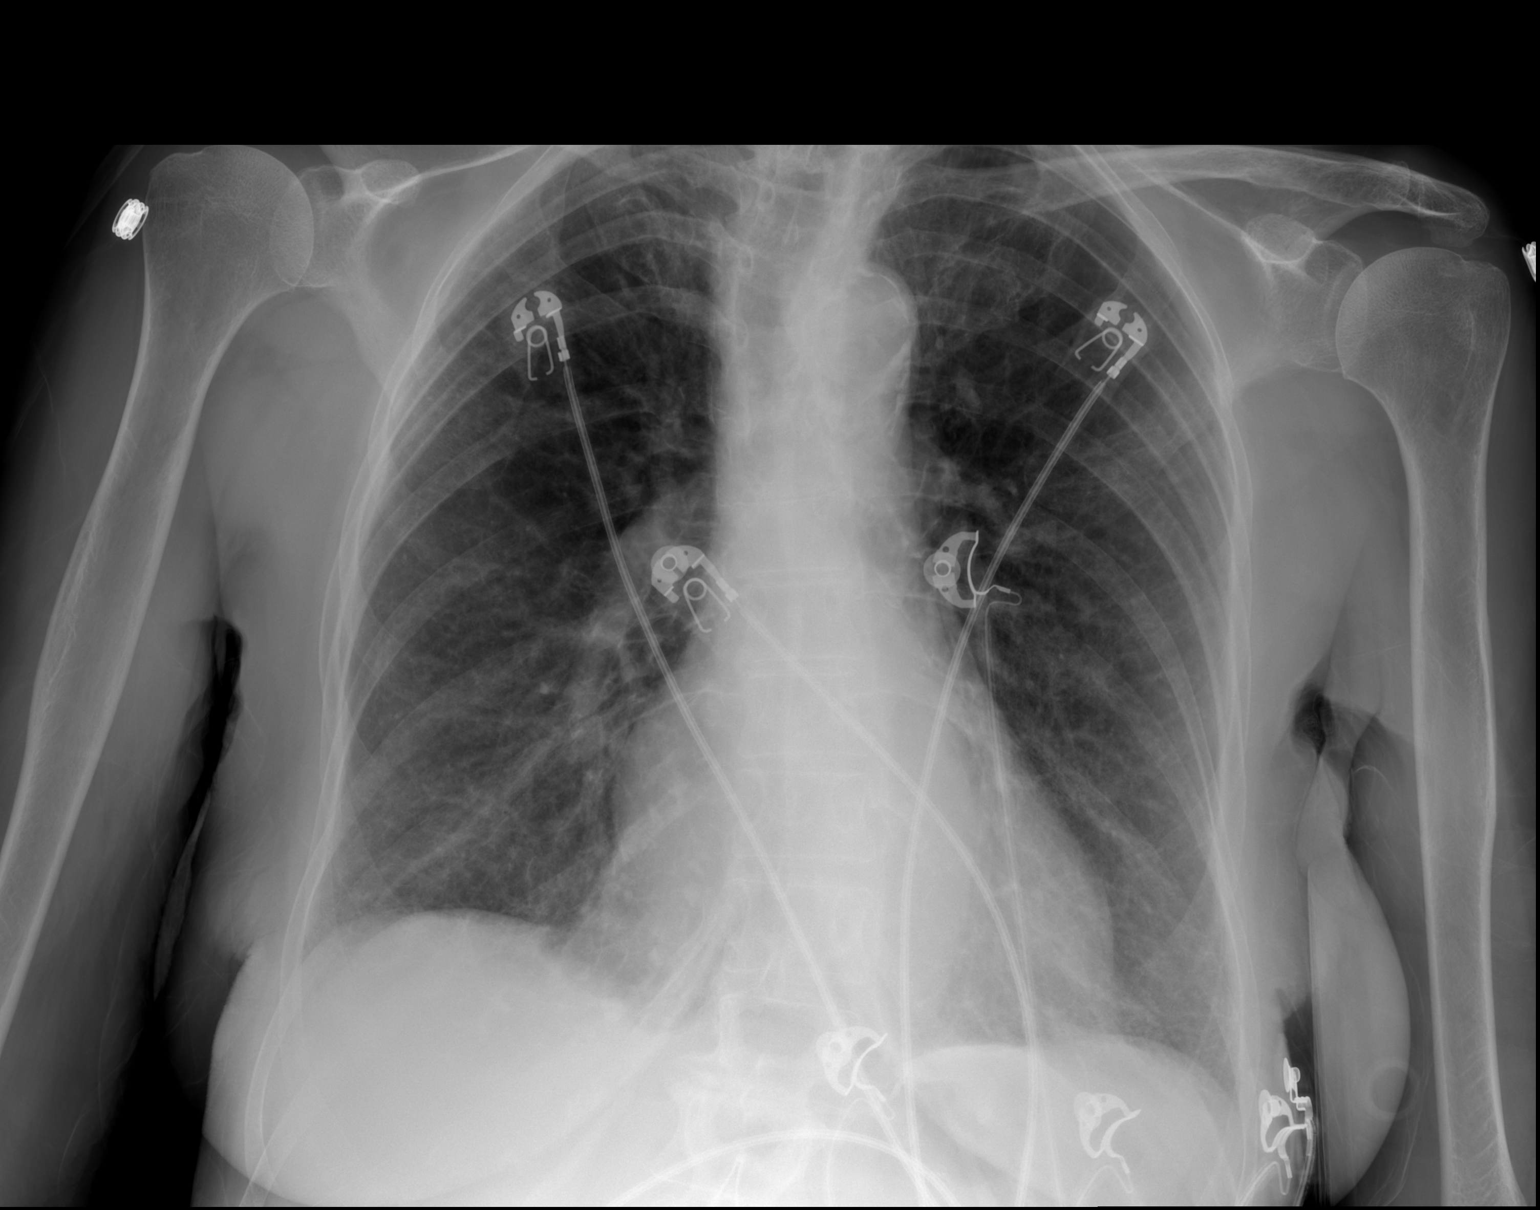

[2 of 2 positions shown; findings below may reference images not displayed]

FINDINGS: Lungs are hyperexpanded. Interstitial markings are diffusely
coarsened with chronic features. The cardiopericardial silhouette is
within normal limits for size. The visualized bony structures of the
thorax are intact. Telemetry leads overlie the chest.
IMPRESSION: Features suggest emphysema.  No acute cardiopulmonary findings.

## 2018-04-28 IMAGING — CT CT CHEST W/O CM
2 of 4 series · 15 of 36 positions shown, 18 images · non-contrast
Comparison: 01/12/2017 chest x-ray.  07/29/2016 chest CT.

CLINICAL DATA: 89-year-old female with chronic dyspnea. Subsequent
encounter.

EXAM:
CT CHEST WITHOUT CONTRAST
TECHNIQUE: Multidetector CT imaging of the chest was performed following the
standard protocol without IV contrast.

[Series 2: chest w/o st · axial · non-contrast · 0.58mm/px · z∈[-345,-77]mm · 12 of 160 slices shown, 15 images]
[im 13/160  mediastinal]
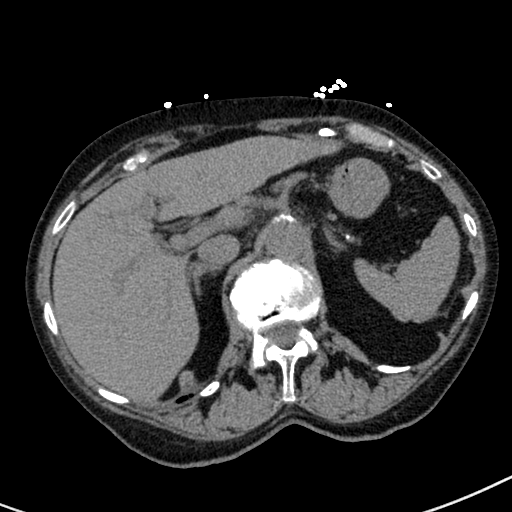
[im 13/160  lung]
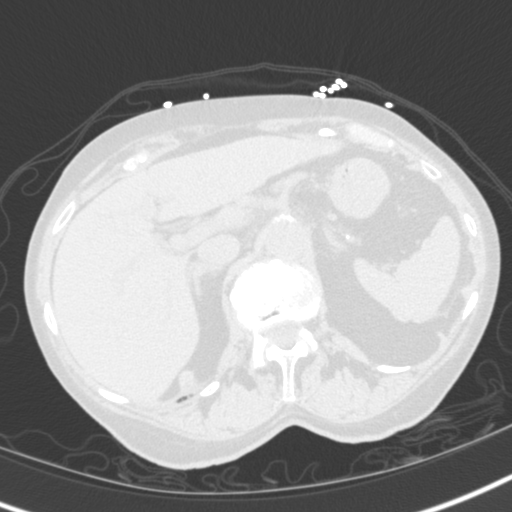
[im 25/160  lung]
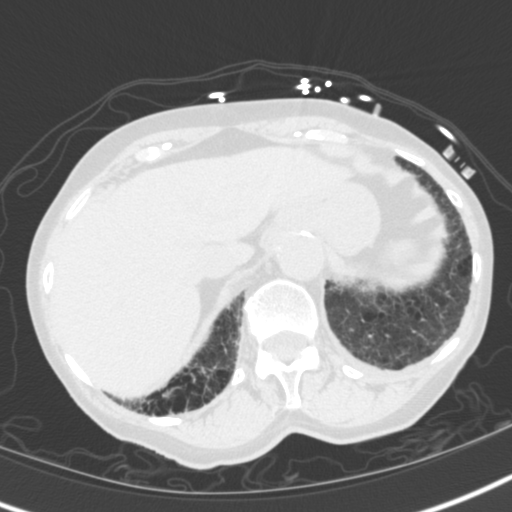
[im 37/160  lung]
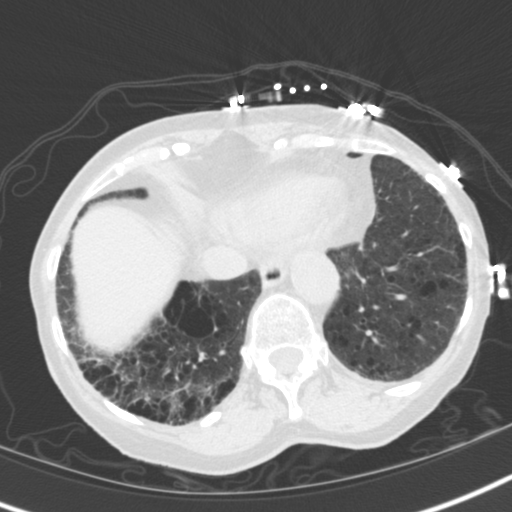
[im 49/160  lung]
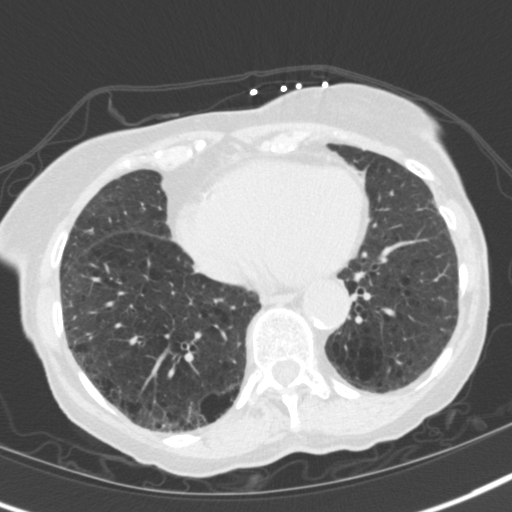
[im 62/160  mediastinal]
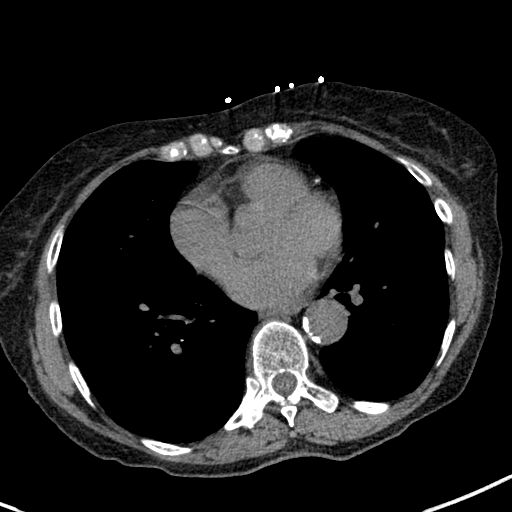
[im 62/160  lung]
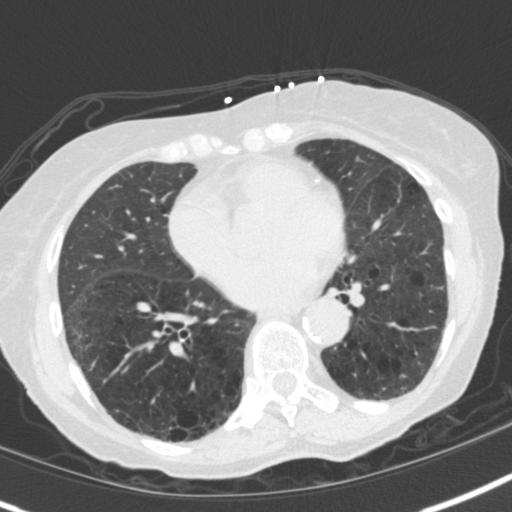
[im 74/160  lung]
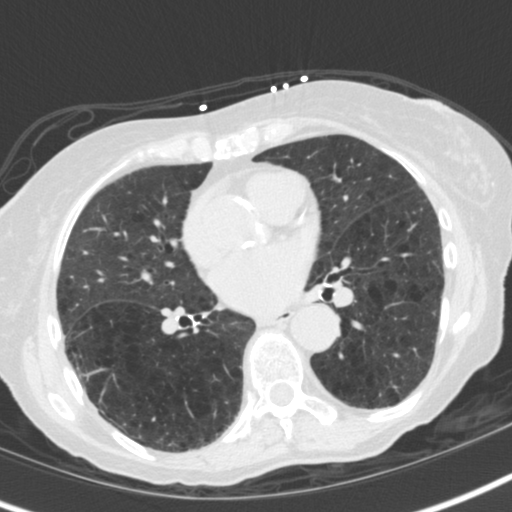
[im 86/160  lung]
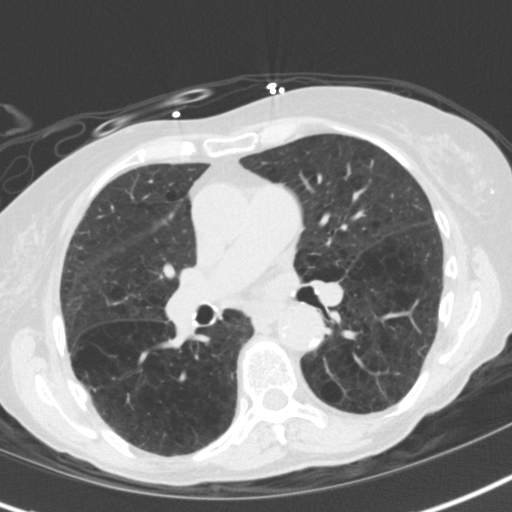
[im 98/160  lung]
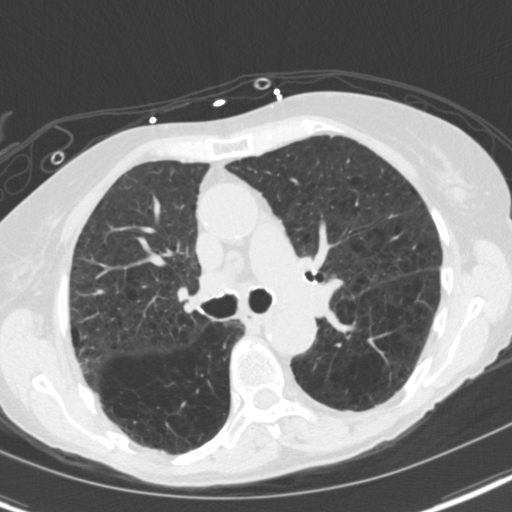
[im 111/160  mediastinal]
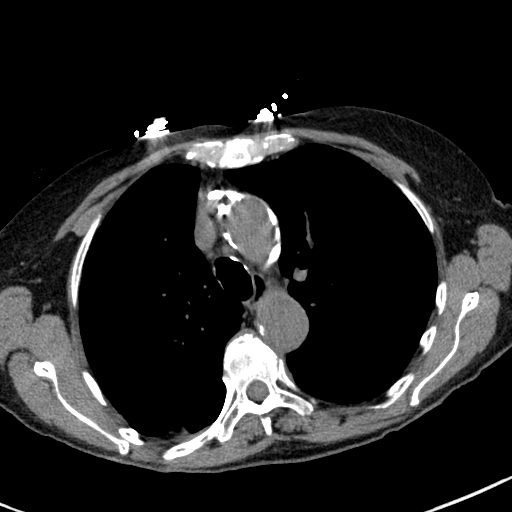
[im 111/160  lung]
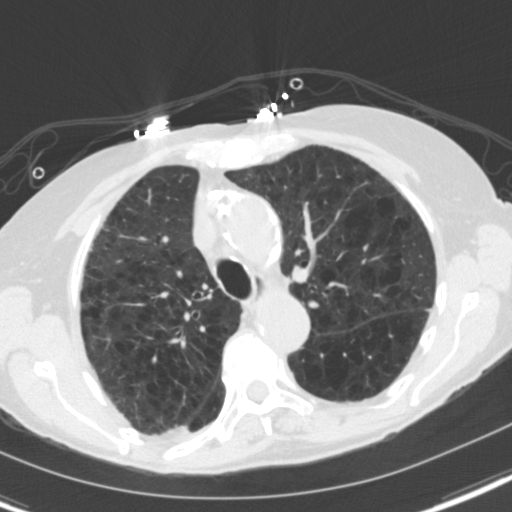
[im 123/160  lung]
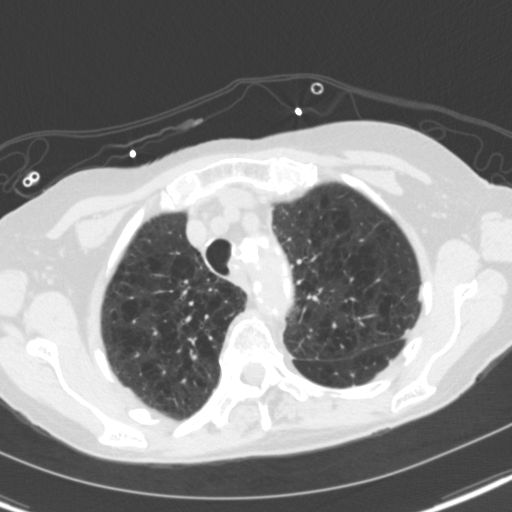
[im 135/160  lung]
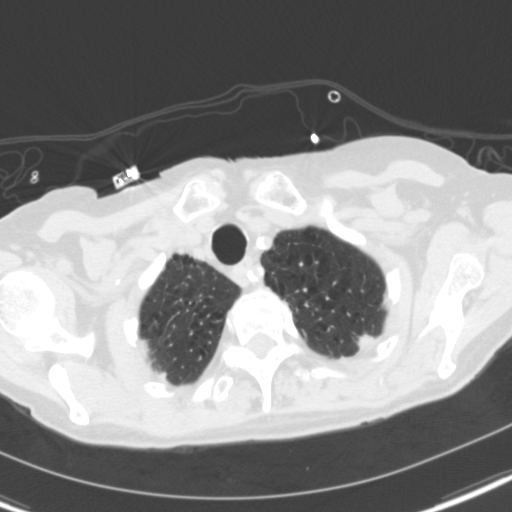
[im 147/160  lung]
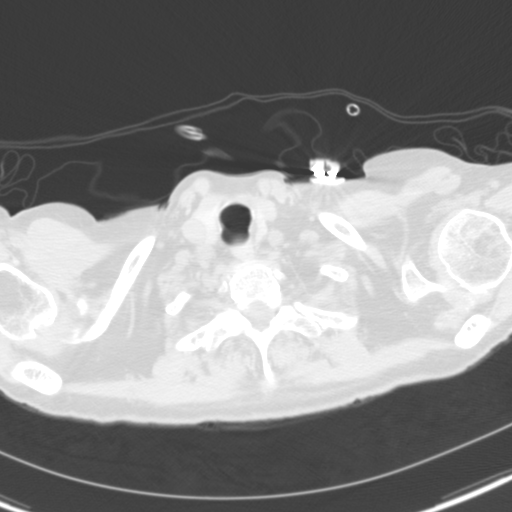

[Series 4: coronals · coronal · 0.56mm/px · 3 of 109 slices shown]
[im 22/109  lung]
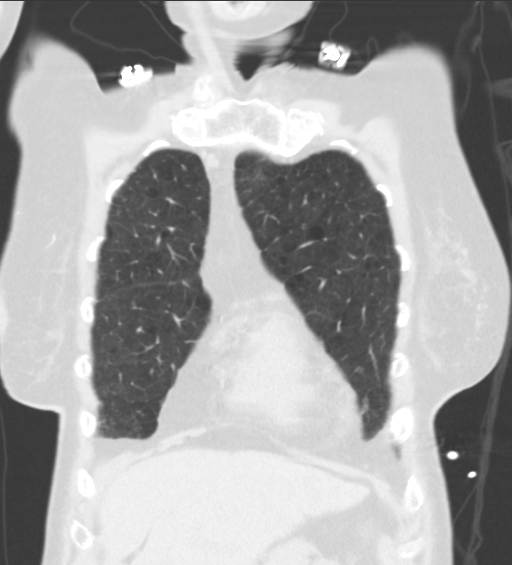
[im 44/109  lung]
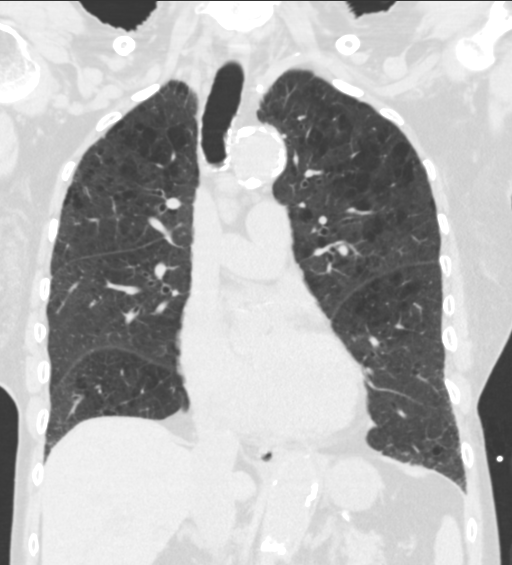
[im 65/109  lung]
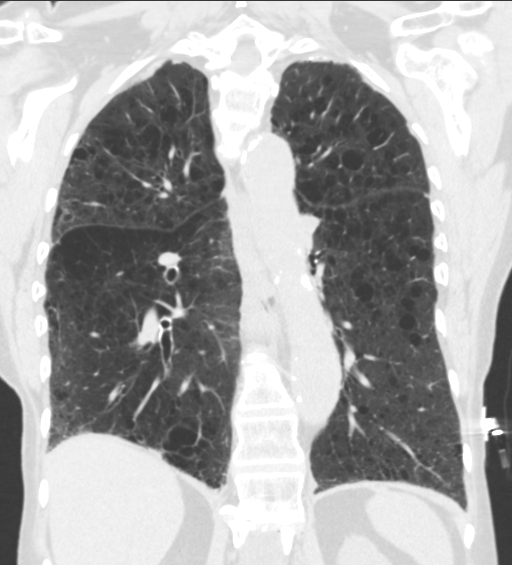

[15 of 36 positions shown; findings below may reference images not displayed]

FINDINGS: Cardiovascular: Calcified aorta and great vessels. Aberrant right
subclavian artery traverses posterior to the esophagus. Top-normal
heart size. Three vessel coronary artery calcification.

Mediastinum/Nodes: Top-normal size precarinal lymph nodes unchanged.

Lungs/Pleura: Moderate to marked centrilobular emphysema. No
infiltrate or pulmonary edema. Scarring lung apices.

Upper Abdomen: No acute abnormality upper abdomen.

Musculoskeletal: Scoliosis and superimposed degenerative changes
similar to prior exam without focal compression fracture.
IMPRESSION: Moderate-to-marked emphysema without change. No acute pulmonary
abnormality.

Coronary artery calcifications.

Aortic Atherosclerosis (V80Z8-TXE.E).  Emphysema (V80Z8-BR1.J).

## 2023-11-03 NOTE — Telephone Encounter (Signed)
 SABRA
